# Patient Record
Sex: Female | Born: 1935 | Race: White | Hispanic: No | Marital: Married | Smoking: Former smoker
Health system: Southern US, Community
[De-identification: ages and names within clinical notes are randomized; demographics above are authoritative.]

## PROBLEM LIST (undated history)

## (undated) DIAGNOSIS — D649 Anemia, unspecified: Secondary | ICD-10-CM

## (undated) DIAGNOSIS — J449 Chronic obstructive pulmonary disease, unspecified: Secondary | ICD-10-CM

## (undated) DIAGNOSIS — F17201 Nicotine dependence, unspecified, in remission: Secondary | ICD-10-CM

## (undated) DIAGNOSIS — E039 Hypothyroidism, unspecified: Secondary | ICD-10-CM

## (undated) DIAGNOSIS — J9690 Respiratory failure, unspecified, unspecified whether with hypoxia or hypercapnia: Secondary | ICD-10-CM

## (undated) DIAGNOSIS — J9610 Chronic respiratory failure, unspecified whether with hypoxia or hypercapnia: Secondary | ICD-10-CM

## (undated) DIAGNOSIS — E785 Hyperlipidemia, unspecified: Secondary | ICD-10-CM

## (undated) DIAGNOSIS — I739 Peripheral vascular disease, unspecified: Secondary | ICD-10-CM

## (undated) DIAGNOSIS — R7309 Other abnormal glucose: Secondary | ICD-10-CM

## (undated) DIAGNOSIS — I10 Essential (primary) hypertension: Secondary | ICD-10-CM

## (undated) DIAGNOSIS — J9811 Atelectasis: Secondary | ICD-10-CM

## (undated) DIAGNOSIS — M1990 Unspecified osteoarthritis, unspecified site: Secondary | ICD-10-CM

## (undated) DIAGNOSIS — K219 Gastro-esophageal reflux disease without esophagitis: Secondary | ICD-10-CM

## (undated) DIAGNOSIS — M810 Age-related osteoporosis without current pathological fracture: Secondary | ICD-10-CM

## (undated) HISTORY — PX: CHOLECYSTECTOMY: SHX55

## (undated) HISTORY — DX: Other abnormal glucose: R73.09

## (undated) HISTORY — PX: COLON SURGERY: SHX602

## (undated) HISTORY — PX: ANKLE FRACTURE SURGERY: SHX122

## (undated) HISTORY — DX: Gastro-esophageal reflux disease without esophagitis: K21.9

---

## 1999-02-06 ENCOUNTER — Emergency Department (HOSPITAL_COMMUNITY): Admission: EM | Admit: 1999-02-06 | Discharge: 1999-02-06 | Payer: Self-pay | Admitting: Emergency Medicine

## 1999-11-29 ENCOUNTER — Other Ambulatory Visit: Admission: RE | Admit: 1999-11-29 | Discharge: 1999-11-29 | Payer: Self-pay | Admitting: Internal Medicine

## 2000-12-09 ENCOUNTER — Encounter: Admission: RE | Admit: 2000-12-09 | Discharge: 2000-12-09 | Payer: Self-pay | Admitting: Internal Medicine

## 2000-12-09 ENCOUNTER — Encounter: Payer: Self-pay | Admitting: Internal Medicine

## 2001-04-14 ENCOUNTER — Ambulatory Visit (HOSPITAL_COMMUNITY): Admission: RE | Admit: 2001-04-14 | Discharge: 2001-04-14 | Payer: Self-pay | Admitting: Gastroenterology

## 2001-07-24 ENCOUNTER — Other Ambulatory Visit: Admission: RE | Admit: 2001-07-24 | Discharge: 2001-07-24 | Payer: Self-pay | Admitting: Internal Medicine

## 2002-03-03 ENCOUNTER — Encounter: Payer: Self-pay | Admitting: Emergency Medicine

## 2002-03-04 ENCOUNTER — Encounter: Payer: Self-pay | Admitting: Internal Medicine

## 2002-03-04 ENCOUNTER — Inpatient Hospital Stay (HOSPITAL_COMMUNITY): Admission: EM | Admit: 2002-03-04 | Discharge: 2002-03-08 | Payer: Self-pay | Admitting: Emergency Medicine

## 2002-03-05 ENCOUNTER — Encounter: Payer: Self-pay | Admitting: Internal Medicine

## 2002-03-13 ENCOUNTER — Encounter (INDEPENDENT_AMBULATORY_CARE_PROVIDER_SITE_OTHER): Payer: Self-pay | Admitting: *Deleted

## 2002-03-13 ENCOUNTER — Inpatient Hospital Stay (HOSPITAL_COMMUNITY): Admission: RE | Admit: 2002-03-13 | Discharge: 2002-03-17 | Payer: Self-pay | Admitting: Surgery

## 2002-03-13 ENCOUNTER — Encounter: Payer: Self-pay | Admitting: Surgery

## 2002-03-16 ENCOUNTER — Encounter: Payer: Self-pay | Admitting: Internal Medicine

## 2002-06-01 ENCOUNTER — Encounter: Admission: RE | Admit: 2002-06-01 | Discharge: 2002-06-01 | Payer: Self-pay | Admitting: Internal Medicine

## 2002-06-01 ENCOUNTER — Encounter: Payer: Self-pay | Admitting: Internal Medicine

## 2002-06-03 ENCOUNTER — Encounter: Payer: Self-pay | Admitting: Internal Medicine

## 2002-06-03 ENCOUNTER — Encounter: Admission: RE | Admit: 2002-06-03 | Discharge: 2002-06-03 | Payer: Self-pay | Admitting: Family Medicine

## 2003-09-10 ENCOUNTER — Encounter: Admission: RE | Admit: 2003-09-10 | Discharge: 2003-09-10 | Payer: Self-pay | Admitting: Internal Medicine

## 2004-12-18 ENCOUNTER — Encounter: Admission: RE | Admit: 2004-12-18 | Discharge: 2004-12-18 | Payer: Self-pay | Admitting: Internal Medicine

## 2004-12-27 ENCOUNTER — Ambulatory Visit (HOSPITAL_COMMUNITY): Admission: RE | Admit: 2004-12-27 | Discharge: 2004-12-27 | Payer: Self-pay | Admitting: Gastroenterology

## 2005-02-12 ENCOUNTER — Encounter: Admission: RE | Admit: 2005-02-12 | Discharge: 2005-02-12 | Payer: Self-pay | Admitting: Gastroenterology

## 2005-10-30 ENCOUNTER — Encounter: Admission: RE | Admit: 2005-10-30 | Discharge: 2005-10-30 | Payer: Self-pay | Admitting: Internal Medicine

## 2005-11-01 ENCOUNTER — Inpatient Hospital Stay (HOSPITAL_COMMUNITY): Admission: AD | Admit: 2005-11-01 | Discharge: 2005-11-07 | Payer: Self-pay | Admitting: Internal Medicine

## 2006-01-29 ENCOUNTER — Encounter: Admission: RE | Admit: 2006-01-29 | Discharge: 2006-01-29 | Payer: Self-pay | Admitting: Internal Medicine

## 2006-04-10 ENCOUNTER — Encounter: Admission: RE | Admit: 2006-04-10 | Discharge: 2006-04-10 | Payer: Self-pay | Admitting: Gastroenterology

## 2006-04-24 ENCOUNTER — Inpatient Hospital Stay (HOSPITAL_COMMUNITY): Admission: AD | Admit: 2006-04-24 | Discharge: 2006-04-26 | Payer: Self-pay | Admitting: Gastroenterology

## 2007-03-13 ENCOUNTER — Emergency Department (HOSPITAL_COMMUNITY): Admission: EM | Admit: 2007-03-13 | Discharge: 2007-03-13 | Payer: Self-pay | Admitting: *Deleted

## 2007-04-25 ENCOUNTER — Ambulatory Visit (HOSPITAL_COMMUNITY): Admission: RE | Admit: 2007-04-25 | Discharge: 2007-04-25 | Payer: Self-pay | Admitting: Neurology

## 2007-05-01 ENCOUNTER — Ambulatory Visit: Payer: Self-pay | Admitting: *Deleted

## 2007-05-09 ENCOUNTER — Inpatient Hospital Stay (HOSPITAL_COMMUNITY): Admission: RE | Admit: 2007-05-09 | Discharge: 2007-05-11 | Payer: Self-pay | Admitting: *Deleted

## 2007-05-09 ENCOUNTER — Encounter (INDEPENDENT_AMBULATORY_CARE_PROVIDER_SITE_OTHER): Payer: Self-pay | Admitting: *Deleted

## 2007-05-09 ENCOUNTER — Ambulatory Visit: Payer: Self-pay | Admitting: *Deleted

## 2007-05-10 ENCOUNTER — Ambulatory Visit (HOSPITAL_COMMUNITY): Admission: RE | Admit: 2007-05-10 | Discharge: 2007-05-10 | Payer: Self-pay | Admitting: Neurology

## 2007-05-22 ENCOUNTER — Ambulatory Visit: Payer: Self-pay | Admitting: *Deleted

## 2007-06-13 ENCOUNTER — Ambulatory Visit: Payer: Self-pay | Admitting: Internal Medicine

## 2007-06-13 DIAGNOSIS — K219 Gastro-esophageal reflux disease without esophagitis: Secondary | ICD-10-CM

## 2007-06-14 DIAGNOSIS — J4489 Other specified chronic obstructive pulmonary disease: Secondary | ICD-10-CM | POA: Insufficient documentation

## 2007-06-14 DIAGNOSIS — J449 Chronic obstructive pulmonary disease, unspecified: Secondary | ICD-10-CM

## 2007-06-26 DIAGNOSIS — M810 Age-related osteoporosis without current pathological fracture: Secondary | ICD-10-CM | POA: Insufficient documentation

## 2007-06-26 DIAGNOSIS — E785 Hyperlipidemia, unspecified: Secondary | ICD-10-CM | POA: Insufficient documentation

## 2007-06-26 DIAGNOSIS — R7309 Other abnormal glucose: Secondary | ICD-10-CM

## 2007-06-27 ENCOUNTER — Ambulatory Visit: Payer: Self-pay | Admitting: Internal Medicine

## 2007-08-07 ENCOUNTER — Encounter: Payer: Self-pay | Admitting: Internal Medicine

## 2007-08-21 ENCOUNTER — Ambulatory Visit: Payer: Self-pay | Admitting: Internal Medicine

## 2007-08-21 ENCOUNTER — Ambulatory Visit: Payer: Self-pay | Admitting: *Deleted

## 2007-09-05 ENCOUNTER — Ambulatory Visit: Payer: Self-pay | Admitting: Internal Medicine

## 2007-09-16 ENCOUNTER — Ambulatory Visit: Payer: Self-pay | Admitting: Pulmonary Disease

## 2007-09-22 ENCOUNTER — Telehealth (INDEPENDENT_AMBULATORY_CARE_PROVIDER_SITE_OTHER): Payer: Self-pay | Admitting: *Deleted

## 2007-09-30 ENCOUNTER — Ambulatory Visit: Payer: Self-pay | Admitting: Internal Medicine

## 2007-10-09 ENCOUNTER — Encounter: Admission: RE | Admit: 2007-10-09 | Discharge: 2007-10-09 | Payer: Self-pay | Admitting: Internal Medicine

## 2007-10-21 ENCOUNTER — Ambulatory Visit: Payer: Self-pay | Admitting: Internal Medicine

## 2007-10-21 ENCOUNTER — Telehealth (INDEPENDENT_AMBULATORY_CARE_PROVIDER_SITE_OTHER): Payer: Self-pay | Admitting: *Deleted

## 2007-10-21 DIAGNOSIS — R0609 Other forms of dyspnea: Secondary | ICD-10-CM

## 2007-10-21 DIAGNOSIS — R0989 Other specified symptoms and signs involving the circulatory and respiratory systems: Secondary | ICD-10-CM

## 2007-10-28 LAB — CONVERTED CEMR LAB
BUN: 22 mg/dL (ref 6–23)
Basophils Relative: 0.2 % (ref 0.0–1.0)
CO2: 30 meq/L (ref 19–32)
Chloride: 97 meq/L (ref 96–112)
Creatinine, Ser: 0.9 mg/dL (ref 0.4–1.2)
Eosinophils Absolute: 0 10*3/uL (ref 0.0–0.7)
Eosinophils Relative: 0.1 % (ref 0.0–5.0)
Glucose, Bld: 333 mg/dL — ABNORMAL HIGH (ref 70–99)
Lymphocytes Relative: 8.3 % — ABNORMAL LOW (ref 12.0–46.0)
MCV: 92.1 fL (ref 78.0–100.0)
Monocytes Relative: 1.9 % — ABNORMAL LOW (ref 3.0–12.0)
Neutrophils Relative %: 89.5 % — ABNORMAL HIGH (ref 43.0–77.0)
RBC: 3.66 M/uL — ABNORMAL LOW (ref 3.87–5.11)
WBC: 6.3 10*3/uL (ref 4.5–10.5)

## 2007-10-31 ENCOUNTER — Ambulatory Visit: Payer: Self-pay | Admitting: Internal Medicine

## 2007-11-04 LAB — CONVERTED CEMR LAB
T3 Uptake Ratio: 42.6 % — ABNORMAL HIGH (ref 22.5–37.0)
T3, Free: 2.7 pg/mL (ref 2.3–4.2)
T4, Total: 9.2 ug/dL (ref 5.0–12.5)

## 2007-11-05 ENCOUNTER — Encounter: Payer: Self-pay | Admitting: Internal Medicine

## 2007-11-07 ENCOUNTER — Ambulatory Visit: Payer: Self-pay | Admitting: Endocrinology

## 2007-11-07 ENCOUNTER — Ambulatory Visit: Payer: Self-pay | Admitting: Internal Medicine

## 2007-11-07 DIAGNOSIS — E039 Hypothyroidism, unspecified: Secondary | ICD-10-CM | POA: Insufficient documentation

## 2007-11-24 ENCOUNTER — Encounter: Payer: Self-pay | Admitting: Internal Medicine

## 2007-11-28 ENCOUNTER — Encounter: Payer: Self-pay | Admitting: Internal Medicine

## 2007-12-22 ENCOUNTER — Ambulatory Visit: Payer: Self-pay | Admitting: Internal Medicine

## 2008-01-28 ENCOUNTER — Encounter: Payer: Self-pay | Admitting: Internal Medicine

## 2008-02-20 ENCOUNTER — Ambulatory Visit: Payer: Self-pay | Admitting: Internal Medicine

## 2008-02-20 DIAGNOSIS — R109 Unspecified abdominal pain: Secondary | ICD-10-CM | POA: Insufficient documentation

## 2008-02-26 ENCOUNTER — Ambulatory Visit: Payer: Self-pay | Admitting: *Deleted

## 2008-03-11 ENCOUNTER — Ambulatory Visit: Payer: Self-pay | Admitting: *Deleted

## 2008-05-06 ENCOUNTER — Ambulatory Visit: Payer: Self-pay | Admitting: *Deleted

## 2008-05-15 ENCOUNTER — Encounter: Admission: RE | Admit: 2008-05-15 | Discharge: 2008-05-15 | Payer: Self-pay | Admitting: *Deleted

## 2008-05-20 ENCOUNTER — Ambulatory Visit: Payer: Self-pay | Admitting: *Deleted

## 2008-06-07 ENCOUNTER — Encounter: Payer: Self-pay | Admitting: Internal Medicine

## 2008-06-08 ENCOUNTER — Encounter: Payer: Self-pay | Admitting: Internal Medicine

## 2008-06-21 ENCOUNTER — Telehealth (INDEPENDENT_AMBULATORY_CARE_PROVIDER_SITE_OTHER): Payer: Self-pay | Admitting: *Deleted

## 2008-06-25 ENCOUNTER — Ambulatory Visit: Payer: Self-pay | Admitting: Internal Medicine

## 2008-06-27 ENCOUNTER — Emergency Department (HOSPITAL_COMMUNITY): Admission: EM | Admit: 2008-06-27 | Discharge: 2008-06-27 | Payer: Self-pay | Admitting: Emergency Medicine

## 2008-07-09 ENCOUNTER — Telehealth: Payer: Self-pay | Admitting: Endocrinology

## 2008-07-12 ENCOUNTER — Ambulatory Visit: Payer: Self-pay | Admitting: Internal Medicine

## 2008-07-12 DIAGNOSIS — R42 Dizziness and giddiness: Secondary | ICD-10-CM

## 2008-07-12 LAB — CONVERTED CEMR LAB
BUN: 28 mg/dL — ABNORMAL HIGH (ref 6–23)
Basophils Absolute: 0 10*3/uL (ref 0.0–0.1)
Basophils Relative: 0.4 % (ref 0.0–3.0)
CO2: 32 meq/L (ref 19–32)
Chloride: 100 meq/L (ref 96–112)
Creatinine, Ser: 0.9 mg/dL (ref 0.4–1.2)
Eosinophils Relative: 0.9 % (ref 0.0–5.0)
Lymphocytes Relative: 9 % — ABNORMAL LOW (ref 12.0–46.0)
Neutrophils Relative %: 86.4 % — ABNORMAL HIGH (ref 43.0–77.0)
Platelets: 339 10*3/uL (ref 150–400)
Potassium: 4.3 meq/L (ref 3.5–5.1)
Pro B Natriuretic peptide (BNP): 187 pg/mL — ABNORMAL HIGH (ref 0.0–100.0)
RBC: 3.26 M/uL — ABNORMAL LOW (ref 3.87–5.11)
WBC: 9.7 10*3/uL (ref 4.5–10.5)

## 2008-07-28 ENCOUNTER — Inpatient Hospital Stay (HOSPITAL_COMMUNITY): Admission: EM | Admit: 2008-07-28 | Discharge: 2008-07-29 | Payer: Self-pay | Admitting: Emergency Medicine

## 2008-08-25 ENCOUNTER — Ambulatory Visit: Payer: Self-pay | Admitting: Internal Medicine

## 2008-09-27 ENCOUNTER — Ambulatory Visit: Payer: Self-pay | Admitting: Internal Medicine

## 2008-10-05 ENCOUNTER — Ambulatory Visit: Payer: Self-pay | Admitting: Pulmonary Disease

## 2008-10-12 ENCOUNTER — Inpatient Hospital Stay (HOSPITAL_COMMUNITY): Admission: EM | Admit: 2008-10-12 | Discharge: 2008-10-15 | Payer: Self-pay | Admitting: Emergency Medicine

## 2008-11-24 ENCOUNTER — Ambulatory Visit: Payer: Self-pay | Admitting: Vascular Surgery

## 2008-11-24 ENCOUNTER — Encounter (INDEPENDENT_AMBULATORY_CARE_PROVIDER_SITE_OTHER): Payer: Self-pay | Admitting: Internal Medicine

## 2008-11-24 ENCOUNTER — Inpatient Hospital Stay (HOSPITAL_COMMUNITY): Admission: EM | Admit: 2008-11-24 | Discharge: 2008-11-30 | Payer: Self-pay | Admitting: Emergency Medicine

## 2008-11-24 ENCOUNTER — Ambulatory Visit: Payer: Self-pay | Admitting: Emergency Medicine

## 2008-11-29 ENCOUNTER — Ambulatory Visit: Payer: Self-pay | Admitting: Physical Medicine & Rehabilitation

## 2008-11-30 ENCOUNTER — Inpatient Hospital Stay (HOSPITAL_COMMUNITY)
Admission: RE | Admit: 2008-11-30 | Discharge: 2008-12-09 | Payer: Self-pay | Admitting: Physical Medicine & Rehabilitation

## 2008-12-01 ENCOUNTER — Encounter: Payer: Self-pay | Admitting: Internal Medicine

## 2008-12-14 ENCOUNTER — Encounter: Payer: Self-pay | Admitting: Internal Medicine

## 2008-12-17 ENCOUNTER — Encounter: Payer: Self-pay | Admitting: Internal Medicine

## 2008-12-21 ENCOUNTER — Ambulatory Visit: Payer: Self-pay | Admitting: Internal Medicine

## 2009-02-04 ENCOUNTER — Ambulatory Visit: Payer: Self-pay | Admitting: Internal Medicine

## 2009-03-04 ENCOUNTER — Telehealth (INDEPENDENT_AMBULATORY_CARE_PROVIDER_SITE_OTHER): Payer: Self-pay | Admitting: *Deleted

## 2009-03-18 ENCOUNTER — Ambulatory Visit: Payer: Self-pay | Admitting: Internal Medicine

## 2009-03-25 ENCOUNTER — Encounter: Admission: RE | Admit: 2009-03-25 | Discharge: 2009-03-25 | Payer: Self-pay | Admitting: Internal Medicine

## 2009-05-03 ENCOUNTER — Ambulatory Visit: Payer: Self-pay | Admitting: Internal Medicine

## 2009-07-06 ENCOUNTER — Encounter: Payer: Self-pay | Admitting: Internal Medicine

## 2009-07-29 ENCOUNTER — Telehealth (INDEPENDENT_AMBULATORY_CARE_PROVIDER_SITE_OTHER): Payer: Self-pay | Admitting: *Deleted

## 2009-09-23 ENCOUNTER — Telehealth: Payer: Self-pay | Admitting: Internal Medicine

## 2010-03-16 ENCOUNTER — Ambulatory Visit: Payer: Self-pay | Admitting: Pulmonary Disease

## 2010-03-16 ENCOUNTER — Encounter: Payer: Self-pay | Admitting: Pulmonary Disease

## 2010-03-16 DIAGNOSIS — R079 Chest pain, unspecified: Secondary | ICD-10-CM | POA: Insufficient documentation

## 2010-03-17 ENCOUNTER — Telehealth: Payer: Self-pay | Admitting: Pulmonary Disease

## 2010-03-21 LAB — CONVERTED CEMR LAB
BUN: 27 mg/dL — ABNORMAL HIGH (ref 6–23)
Basophils Relative: 0.5 % (ref 0.0–3.0)
CO2: 33 meq/L — ABNORMAL HIGH (ref 19–32)
Chloride: 97 meq/L (ref 96–112)
Creatinine, Ser: 1 mg/dL (ref 0.4–1.2)
Eosinophils Absolute: 0 10*3/uL (ref 0.0–0.7)
Eosinophils Relative: 0.3 % (ref 0.0–5.0)
Glucose, Bld: 148 mg/dL — ABNORMAL HIGH (ref 70–99)
Hemoglobin: 12.3 g/dL (ref 12.0–15.0)
Lymphocytes Relative: 7.9 % — ABNORMAL LOW (ref 12.0–46.0)
MCHC: 34.1 g/dL (ref 30.0–36.0)
Neutro Abs: 9.4 10*3/uL — ABNORMAL HIGH (ref 1.4–7.7)
Neutrophils Relative %: 85.8 % — ABNORMAL HIGH (ref 43.0–77.0)
Potassium: 4.9 meq/L (ref 3.5–5.1)
Pro B Natriuretic peptide (BNP): 63.4 pg/mL (ref 0.0–100.0)
RBC: 3.71 M/uL — ABNORMAL LOW (ref 3.87–5.11)
Total CK: 86 units/L (ref 7–177)
WBC: 11 10*3/uL — ABNORMAL HIGH (ref 4.5–10.5)

## 2010-04-03 ENCOUNTER — Ambulatory Visit: Payer: Self-pay | Admitting: Internal Medicine

## 2010-04-03 DIAGNOSIS — M549 Dorsalgia, unspecified: Secondary | ICD-10-CM | POA: Insufficient documentation

## 2010-04-10 ENCOUNTER — Telehealth: Payer: Self-pay | Admitting: Adult Health

## 2010-04-11 ENCOUNTER — Telehealth: Payer: Self-pay | Admitting: Adult Health

## 2010-04-12 ENCOUNTER — Encounter: Admission: RE | Admit: 2010-04-12 | Discharge: 2010-04-12 | Payer: Self-pay | Admitting: Internal Medicine

## 2010-04-12 ENCOUNTER — Telehealth: Payer: Self-pay | Admitting: Adult Health

## 2010-04-13 ENCOUNTER — Telehealth (INDEPENDENT_AMBULATORY_CARE_PROVIDER_SITE_OTHER): Payer: Self-pay | Admitting: *Deleted

## 2010-04-14 ENCOUNTER — Telehealth (INDEPENDENT_AMBULATORY_CARE_PROVIDER_SITE_OTHER): Payer: Self-pay | Admitting: *Deleted

## 2010-04-18 ENCOUNTER — Ambulatory Visit (HOSPITAL_COMMUNITY): Admission: RE | Admit: 2010-04-18 | Discharge: 2010-04-18 | Payer: Self-pay | Admitting: Internal Medicine

## 2010-04-20 ENCOUNTER — Telehealth: Payer: Self-pay | Admitting: Internal Medicine

## 2010-04-20 ENCOUNTER — Ambulatory Visit: Payer: Self-pay | Admitting: Internal Medicine

## 2010-04-21 LAB — CONVERTED CEMR LAB
AST: 27 units/L (ref 0–37)
Albumin: 3.6 g/dL (ref 3.5–5.2)
Alkaline Phosphatase: 94 units/L (ref 39–117)
Basophils Absolute: 0 10*3/uL (ref 0.0–0.1)
Basophils Relative: 0 % (ref 0.0–3.0)
CO2: 30 meq/L (ref 19–32)
Calcium: 9.3 mg/dL (ref 8.4–10.5)
Chloride: 94 meq/L — ABNORMAL LOW (ref 96–112)
Eosinophils Absolute: 0 10*3/uL (ref 0.0–0.7)
Glucose, Bld: 64 mg/dL — ABNORMAL LOW (ref 70–99)
HCT: 35 % — ABNORMAL LOW (ref 36.0–46.0)
Hemoglobin: 12.1 g/dL (ref 12.0–15.0)
Lymphocytes Relative: 7.5 % — ABNORMAL LOW (ref 12.0–46.0)
Lymphs Abs: 0.6 10*3/uL — ABNORMAL LOW (ref 0.7–4.0)
MCHC: 34.5 g/dL (ref 30.0–36.0)
MCV: 95.2 fL (ref 78.0–100.0)
Neutro Abs: 6.6 10*3/uL (ref 1.4–7.7)
Potassium: 4.8 meq/L (ref 3.5–5.1)
RBC: 3.67 M/uL — ABNORMAL LOW (ref 3.87–5.11)
RDW: 12.9 % (ref 11.5–14.6)
Sodium: 134 meq/L — ABNORMAL LOW (ref 135–145)
TSH: 2.36 microintl units/mL (ref 0.35–5.50)
Total Protein: 6.6 g/dL (ref 6.0–8.3)

## 2010-05-01 ENCOUNTER — Ambulatory Visit: Payer: Self-pay | Admitting: Adult Health

## 2010-05-04 ENCOUNTER — Telehealth: Payer: Self-pay | Admitting: Adult Health

## 2010-05-09 ENCOUNTER — Telehealth: Payer: Self-pay | Admitting: Internal Medicine

## 2010-05-09 ENCOUNTER — Emergency Department (HOSPITAL_COMMUNITY)
Admission: EM | Admit: 2010-05-09 | Discharge: 2010-05-10 | Payer: Self-pay | Source: Home / Self Care | Admitting: Emergency Medicine

## 2010-05-13 ENCOUNTER — Inpatient Hospital Stay (HOSPITAL_COMMUNITY)
Admission: EM | Admit: 2010-05-13 | Discharge: 2010-05-20 | Payer: Self-pay | Source: Home / Self Care | Attending: Internal Medicine | Admitting: Internal Medicine

## 2010-05-16 ENCOUNTER — Telehealth: Payer: Self-pay | Admitting: Internal Medicine

## 2010-05-17 LAB — GLUCOSE, CAPILLARY
Glucose-Capillary: 267 mg/dL — ABNORMAL HIGH (ref 70–99)
Glucose-Capillary: 156 mg/dL — ABNORMAL HIGH (ref 70–99)
Glucose-Capillary: 166 mg/dL — ABNORMAL HIGH (ref 70–99)
Glucose-Capillary: 205 mg/dL — ABNORMAL HIGH (ref 70–99)

## 2010-05-17 LAB — BASIC METABOLIC PANEL
BUN: 31 mg/dL — ABNORMAL HIGH (ref 6–23)
CO2: 28 mEq/L (ref 19–32)
Calcium: 9.2 mg/dL (ref 8.4–10.5)
Chloride: 98 mEq/L (ref 96–112)
Creatinine, Ser: 0.78 mg/dL (ref 0.4–1.2)
GFR calc Af Amer: >60 mL/min (ref >60)
GFR calc non Af Amer: >60 mL/min (ref >60)
Glucose, Bld: 221 mg/dL — ABNORMAL HIGH (ref 70–99)
Potassium: 4.9 mEq/L (ref 3.5–5.1)
Sodium: 135 mEq/L (ref 135–145)

## 2010-05-17 LAB — CBC
HCT: 35.4 % — ABNORMAL LOW (ref 36.0–46.0)
Hemoglobin: 11.9 g/dL — ABNORMAL LOW (ref 12.0–15.0)
MCH: 31.2 pg (ref 26.0–34.0)
MCHC: 33.6 g/dL (ref 30.0–36.0)
MCV: 92.9 fL (ref 78.0–100.0)
Platelets: 333 10*3/uL (ref 150–400)
RBC: 3.81 MIL/uL — ABNORMAL LOW (ref 3.87–5.11)
RDW: 12.4 % (ref 11.5–15.5)
WBC: 10 10*3/uL (ref 4.0–10.5)

## 2010-05-18 LAB — GLUCOSE, CAPILLARY
Glucose-Capillary: 121 mg/dL — ABNORMAL HIGH (ref 70–99)
Glucose-Capillary: 140 mg/dL — ABNORMAL HIGH (ref 70–99)
Glucose-Capillary: 108 mg/dL — ABNORMAL HIGH (ref 70–99)
Glucose-Capillary: 59 mg/dL — ABNORMAL LOW (ref 70–99)
Glucose-Capillary: 197 mg/dL — ABNORMAL HIGH (ref 70–99)

## 2010-05-19 ENCOUNTER — Telehealth: Payer: Self-pay | Admitting: Internal Medicine

## 2010-05-19 LAB — GLUCOSE, CAPILLARY
Glucose-Capillary: 192 mg/dL — ABNORMAL HIGH (ref 70–99)
Glucose-Capillary: 45 mg/dL — ABNORMAL LOW (ref 70–99)
Glucose-Capillary: 97 mg/dL (ref 70–99)

## 2010-05-29 LAB — GLUCOSE, CAPILLARY
Glucose-Capillary: 146 mg/dL — ABNORMAL HIGH (ref 70–99)
Glucose-Capillary: 138 mg/dL — ABNORMAL HIGH (ref 70–99)
Glucose-Capillary: 151 mg/dL — ABNORMAL HIGH (ref 70–99)
Glucose-Capillary: 82 mg/dL (ref 70–99)

## 2010-05-29 LAB — CBC
HCT: 38 % (ref 36.0–46.0)
Hemoglobin: 12.5 g/dL (ref 12.0–15.0)
MCH: 31.6 pg (ref 26.0–34.0)
MCHC: 32.9 g/dL (ref 30.0–36.0)
MCV: 96 fL (ref 78.0–100.0)
Platelets: 345 10*3/uL (ref 150–400)
RBC: 3.96 MIL/uL (ref 3.87–5.11)
RDW: 12.8 % (ref 11.5–15.5)
WBC: 17.1 10*3/uL — ABNORMAL HIGH (ref 4.0–10.5)

## 2010-06-04 ENCOUNTER — Encounter: Payer: Self-pay | Admitting: Neurology

## 2010-06-04 ENCOUNTER — Encounter: Payer: Self-pay | Admitting: Gastroenterology

## 2010-06-04 ENCOUNTER — Encounter: Payer: Self-pay | Admitting: Internal Medicine

## 2010-06-15 NOTE — Progress Notes (Signed)
Summary: test results  Phone Note Call from Patient Call back at Home Phone [phone]   Caller: Patient Call For: alva Reason for Call: Lab or Test Results Summary of Call: Wants results of labs and cxr. Initial call taken by: Darletta Moll,  March 17, 2010 1:23 PM  Follow-up for Phone Call        cxr signed in EMR but no interpretion for nurse.  please advise and will call pt, thanks! Boone Master CNA/MA  March 17, 2010 2:52 PM   Additional Follow-up for Phone Call Additional follow up Details #1::        Let her know  - no evidence of heart attack or pneumonia, may have to consider vertebral fracture with nerve impingement or intestinal problems as cause of pain - wait & see how she does with pain meds - fax repots to dr gates [address] - I have spoken to him Additional Follow-up by: Comer Locket. Vassie Loll MD,  March 17, 2010 5:01 PM    Additional Follow-up for Phone Call Additional follow up Details #2::    Pt advised. [name] CMA  March 17, 2010 5:08 PM

## 2010-06-15 NOTE — Assessment & Plan Note (Signed)
Summary: SOB/ MW'S PT ///KP   Visit Type:  Sick visit Primary Provider/Referring Provider:  Johnella Moloney  CC:  Pt of Dr. Sherene Sires... c/o increased SOB starting yesterday and sharp intermittent upper abdominal pain and bloating.  Pt was informed by [facility] that she has blocked vessels in arteries causing pain.  History of Present Illness: 73  yowf quit smoking in the year 2002 with onset doe and a mild chronic cough.  She subsequently gained 15 pounds and beginning about the middle of 2006 began to have exacerbations with coughing wheezing and shortness of breath which required short course of prednisone to get her back to baseline but requiring  Prednisone daily since  3/09.   December 22, 2007 ov no decrease in ex tol albeit on oxygen at 24 hours a day and maintaining prednisone at 10 mg daily with gradual improvement in dyspnea.  March 16, 2010 3:39 PM  c/o sharp pain substernally radiating back along lower rib cage, c/o dyspnea , no wheeze, no signs of cold - NOt taking spiriva Steroid dependent, takes pulmicort & duonebs  Told at [facility] that she has clogged arteries causing abdominal pain EKG - nSR with RBBB& left bifascicular block - q waves in V1-2 related to BBB ? vs old MI    Preventive Screening-Counseling & Management  Alcohol-Tobacco     Smoking Status: quit     Year Quit: 2001     Pack years: 1 1/2 PPD X 55 years.  Current Medications (verified): 1)  Sertraline Hcl 50 Mg  Tabs (Sertraline Hcl) .... 1/2 Tab By Mouth Once Daily 2)  Benicar 20 Mg Tabs (Olmesartan Medoxomil) .... 1/2 Once Daily 3)  Centrum Silver   Tabs (Multiple Vitamins-Minerals) .... Once Daily 4)  Caltrate 600+d Plus 600-400 Mg-Unit  Tabs (Calcium Carbonate-Vit D-Min) .... Two Times A Day 5)  Vitamin D3 1000 Unit  Tabs (Cholecalciferol) .... Two Times A Day 6)  Vitamin C 1000 Mg Tabs (Ascorbic Acid) .... Take 1 Tablet By Mouth Two Times A Day 7)  Potassium Gluconate 595 Mg  Tbcr (Potassium Gluconate)  .... Once Daily 8)  Omeprazole 20 Mg Cpdr (Omeprazole) .Marland Kitchen.. 1 Capsule By Mouth 30 Minutes Before First and Last Meals of The Day 9)  Magnesium 500 Mg Tabs (Magnesium) .Marland Kitchen.. 1 By Mouth Daily 10)  Vitamin B-12 1000 Mcg Tabs (Cyanocobalamin) .... Take 1/2 Tablet By Mouth Once A Day 11)  Humalog Mix 75/25 75-25 % Susp (Insulin Lispro Prot & Lispro) .... 40 Units Every Am and 10 Units At Bedtime*******sliding Scale******* 12)  Novolog 100 Unit/ml  Soln (Insulin Aspart) .... Sliding Scale With Lunch 13)  Furosemide 40 Mg  Tabs (Furosemide) .Marland Kitchen.. 1 1/2 Tab Daily 14)  Levothyroxine Sodium 88 Mcg  Tabs (Levothyroxine Sodium) .... Take 1 Tablet By Mouth Once A Day 15)  Aspirin Adult Low Strength 81 Mg  Tbec (Aspirin) .... 2 Tabs By Mouth Once Daily 16)  Prednisone 10 Mg Tabs (Prednisone) .... Take As Directed or See Bottom of Med Calendar For Specific Tapering Instructions 17)  Duoneb 0.5-2.5 (3) Mg/14ml  Soln (Ipratropium-Albuterol) .Marland Kitchen.. 1 Neb Four Times A Day 18)  Pulmicort 0.5 Mg/75ml  Susp (Budesonide) .... Use 1 Vial Two Times A Day in Nebulizer. 19)  Oxygen 2 Liters .... At Bedtime 20)  Citrucel  Powd (Methylcellulose (Laxative)) .... 1/2 Scoop At Bedtime 21)  Nasal Spray 0.65 % Soln (Saline) .... 2 Puffs Two Times A Day As Needed 22)  Proventil Hfa 108 (90  Base) Mcg/act  Aers (Albuterol Sulfate) .... 2 Puffs Every 4 Hours As Needed 23)  Diazepam 5 Mg  Tabs (Diazepam) .... Two Times A Day As Needed 24)  Oxygen .... Wear 2liters As Needed 25)  Prednisone 10 Mg  Tabs (Prednisone) .... Increase To 2 Tabs Until Back To Your Baseline.  Then 1 Tab Daily and Hold. 26)  Maalox Regular Strength 200-200-20 Mg/70ml Susp (Alum & Mag Hydroxide-Simeth) .... As Directed 27)  Phillips Milk of Magnesia 7.75 % Susp (Magnesium Hydroxide) .... 3 Tablespoonfuls At Bedtime  Allergies (verified): 1)  ! * Spiriva 2)  ! Sulfa 3)  ! Penicillin 4)  ! * Statins  Past History:  Past Medical History: Last updated:  05/03/2009 OSTEOPOROSIS (ICD-733.00) HYPERGLYCEMIA (ICD-790.29) HYPERLIPIDEMIA (ICD-272.4) GERD (ICD-530.81) COPD UNSPECIFIED (ICD-496)    - PFTs 09/05/07 FEV1 49% with improvement by 8% ratio 39% and diffusing capacity 58%   - prednisone -dependent since 3/09   - HFA poor February 04, 2009  HYPOTHYROIDISM (ICD-244.9) COMPLEX MEDICAL REGIMEN    - Med calendar redone June 25, 2008  >> Sep 27, 2008     - Not using calendar February 04, 2009 > not using May 03, 2009   Social History: Last updated: 11/07/2007 quit smoking 2002 married  Review of Systems       The patient complains of dyspnea on exertion.  The patient denies anorexia, fever, weight loss, weight gain, vision loss, decreased hearing, hoarseness, chest pain, syncope, peripheral edema, prolonged cough, headaches, hemoptysis, abdominal pain, melena, hematochezia, severe indigestion/heartburn, hematuria, muscle weakness, suspicious skin lesions, difficulty walking, depression, unusual weight change, abnormal bleeding, enlarged lymph nodes, and angioedema.    Vital Signs:  Patient profile:   77 year old female Height:      62 inches Weight:      139.4 pounds BMI:     25.59 O2 Sat:      98 % on 2 L/min Temp:     98.1 degrees F oral Pulse rate:   79 / minute BP sitting:   126 / 60  (left arm) Cuff size:   regular  Vitals Entered By: Zackery Barefoot CMA (March 16, 2010 3:22 PM)  O2 Flow:  2 L/min CC: Pt of Dr. Sherene Sires... c/o increased SOB starting yesterday, sharp intermittent upper abdominal pain and bloating.  Pt was informed by [facility] that she has blocked vessels in arteries causing pain Comments Medications reviewed with patient Verified contact number and pharmacy with patient Zackery Barefoot CMA  March 16, 2010 3:22 PM    Physical Exam  Additional Exam:   chronically ill  ambulatory white female in no acute distress    wt  139  10/9/9  >  145 July 12, 2008 > 138 December 21, 2008 > 136 February 04, 2009 >> 139 March 16, 2010    HEENT mild turbinate edema.  Oropharynx no thrush or excess pnd or cobblestoning.  No JVD or cervical adenopathy. Mild accessory muscle hypertrophy. Trachea midline, nl thryroid. Chest was hyperinflated by percussion with diminished breath sounds and marked increased exp time without wheeze. Hoover sign positive onset of inspiration. Regular rate and rhythm without murmur gallop or rub or increase P2 or edema.  Decrease s1s2 Abd: no hsm, nl excursion. Ext warm without cyanosis or clubbing   Impression & Recommendations:  Problem # 1:  CHEST PAIN-UNSPECIFIED (ICD-786.50) Favor musculoskeleta l cause of chest pain , no rash of shingles - Has vertebral fractures on old  imaging studies Unable to interpret ST/ T changes on EKG in presence of bundle brach nlock, will get cardiac enzymes, doubt acute coronary syndrome Not obvious GERD CXR today Would suggest round the clock tylenol - may need stronger pain meds like toradol - avoid narcotics or low dose if at all. Orders: T-2 View CXR ([id number]) TLB-CBC Platelet - w/Differential (85025-CBCD) TLB-BMP (Basic Metabolic Panel-BMET) (80048-METABOL) TLB-Cardiac Panel ([id number]) TLB-BNP (B-Natriuretic Peptide) (83880-BNPR) Est. Patient Level IV (19147)  Problem # 2:  COPD UNSPECIFIED (ICD-496) ctc urrent regimen of prednisone, pulmicort & duonebs  Medications Added to Medication List This Visit: 1)  Vitamin B-12 1000 Mcg Tabs (Cyanocobalamin) .... Take 1/2 tablet by mouth once a day 2)  Humalog Mix 75/25 75-25 % Susp (Insulin lispro prot & lispro) .... 40 units every am and 10 units at bedtime*******sliding scale******* 3)  Citrucel Powd (Methylcellulose (laxative)) .... 1/2 scoop at bedtime 4)  Phillips Milk of Magnesia 7.75 % Susp (Magnesium hydroxide) .... 3 tablespoonfuls at bedtime  Patient Instructions: 1)  Copy sent to: Johnella Moloney,  Wert 2)  Please schedule a follow-up appointment in 3 weeks with  TP 3)  A chest x-ray has been recommended.  Your imaging study may require preauthorization.  4)  Blood work 5)  EKG 6)  Take tylenol 500 mg q6h as needed - If pain no better, may need stronger pain medication- call Dr Kevan Ny    Appended Document: SOB/ MW'S PT ///KP d/w Dr Kevan Ny

## 2010-06-15 NOTE — Assessment & Plan Note (Signed)
Summary: Pulmonary/ ext ov fatigue > sob    Primary Provider/Referring Provider:  Johnella Moloney  CC:  Dyspnea- worse.  History of Present Illness: 40  yowf quit smoking  2002 with onset doe and a mild chronic cough.  She subsequently gained 15 pounds and beginning about the middle of 2006 began to have exacerbations with coughing wheezing and shortness of breath which required short course of prednisone to get her back to baseline but requiring  Prednisone daily since  3/09.   December 22, 2007 ov no decrease in ex tol albeit on oxygen at 24 hours a day and maintaining prednisone at 10 mg daily with gradual improvement in dyspnea.  March 16, 2010  c/o sharp pain substernally radiating back along lower rib cage, c/o dyspnea , no wheeze, no signs of cold - Not taking spiriva Steroid dependent, takes pulmicort & duonebs  Told at [facility] that she has clogged arteries causing abdominal pain EKG - nSR with RBBB& left bifascicular block - q waves in V1-2 related to BBB ? vs old MI  April 03, 2010 --Presents for follow up. Had fall 3 weeks ago. She lost her balance in closet 10/29 and fell forward. Had bilateral sharp rib pain w/ radiation into back. CXR w/ no acute changes. Seen at urgent care 2 weeks go, tx w/ advil. She is no better. Has sudden sharp pain in ribs bialterally w/ pain into back-catching pain w/ sudden movement. Denies chest pain, dyspnea, orthopnea, hemoptysis, fever, n/v/d, edema, headache. Her to breathe in at times.  dx T 9 fx > Deveshwar eval, no rx.   April 20, 2010 ov cc sob x 3 weeks, sob at rest, no unusual cough/ congestion, does ok sleeping . Increased pred back to 20 mg per day with no benefit. no purulent sputum. back better, no leg c/ow just gen fatigue.  Pt denies any significant sore throat, nasal congestion or excess secretions, fever, chills, sweats, unintended wt loss, pleuritic or exertional cp, orthopnea pnd or leg swelling.    Current Medications  (verified): 1)  Sertraline Hcl 50 Mg  Tabs (Sertraline Hcl) .... 1/2 Tab By Mouth Once Daily 2)  Benicar 20 Mg Tabs (Olmesartan Medoxomil) .... 1/2 Once Daily 3)  Centrum Silver   Tabs (Multiple Vitamins-Minerals) .... Once Daily 4)  Caltrate 600+d Plus 600-400 Mg-Unit  Tabs (Calcium Carbonate-Vit D-Min) .... Two Times A Day 5)  Vitamin D3 1000 Unit  Tabs (Cholecalciferol) .... Two Times A Day 6)  Vitamin C 1000 Mg Tabs (Ascorbic Acid) .... Take 1 Tablet By Mouth Two Times A Day 7)  Potassium Gluconate 595 Mg  Tbcr (Potassium Gluconate) .... Once Daily 8)  Omeprazole 20 Mg Cpdr (Omeprazole) .Marland Kitchen.. 1 Capsule By Mouth 30 Minutes Before First and Last Meals of The Day 9)  Magnesium 500 Mg Tabs (Magnesium) .Marland Kitchen.. 1 By Mouth Daily 10)  Vitamin B-12 1000 Mcg Tabs (Cyanocobalamin) .... Take 1/2 Tablet By Mouth Once A Day 11)  Humalog Mix 75/25 75-25 % Susp (Insulin Lispro Prot & Lispro) .... Sliding Scale With Each Meal 12)  Novolog 100 Unit/ml  Soln (Insulin Aspart) .... Sliding Scale With Lunch 13)  Furosemide 40 Mg  Tabs (Furosemide) .Marland Kitchen.. 1 1/2 Tab Daily 14)  Levothyroxine Sodium 88 Mcg  Tabs (Levothyroxine Sodium) .... Take 1 Tablet By Mouth Once A Day 15)  Aspirin Adult Low Strength 81 Mg  Tbec (Aspirin) .... 2 Tabs By Mouth Once Daily 16)  Prednisone 10 Mg Tabs (Prednisone) .Marland KitchenMarland KitchenMarland Kitchen  Take As Directed or See Bottom of Med Calendar For Specific Tapering Instructions 17)  Duoneb 0.5-2.5 (3) Mg/24ml  Soln (Ipratropium-Albuterol) .Marland Kitchen.. 1 Neb Four Times A Day 18)  Pulmicort 0.5 Mg/22ml  Susp (Budesonide) .... Use 1 Vial Two Times A Day in Nebulizer. 19)  Oxygen 2 Liters .... At Bedtime 20)  Citrucel  Powd (Methylcellulose (Laxative)) .... 1/2 Scoop At Bedtime 21)  Nasal Spray 0.65 % Soln (Saline) .... 2 Puffs Two Times A Day As Needed 22)  Proventil Hfa 108 (90 Base) Mcg/act  Aers (Albuterol Sulfate) .... 2 Puffs Every 4 Hours As Needed 23)  Diazepam 5 Mg  Tabs (Diazepam) .... Two Times A Day As Needed 24)   Oxygen .... Wear 2liters As Needed 25)  Prednisone 10 Mg  Tabs (Prednisone) .... Increase To 2 Tabs Until Back To Your Baseline.  Then 1 Tab Daily and Hold. 26)  Maalox Regular Strength 200-200-20 Mg/80ml Susp (Alum & Mag Hydroxide-Simeth) .... As Directed 27)  Phillips Milk of Magnesia 7.75 % Susp (Magnesium Hydroxide) .... 3 Tablespoonfuls At Bedtime 28)  Vicodin 5-500 Mg Tabs (Hydrocodone-Acetaminophen) .... 1/2 -1 By Mouth Every 6 Hr As Needed Pain 29)  Calcitonin (Salmon) 200 Unit/act Soln (Calcitonin (Salmon)) .Marland Kitchen.. 1 Spray Each Nostril Daily  Allergies (verified): 1)  ! * Spiriva 2)  ! Sulfa 3)  ! Penicillin 4)  ! * Statins  Past History:  Past Medical History: OSTEOPOROSIS (ICD-733.00) HYPERGLYCEMIA (ICD-790.29) HYPERLIPIDEMIA (ICD-272.4) GERD (ICD-530.81) COPD UNSPECIFIED (ICD-496)    - PFTs 09/05/07 FEV1 49% with improvement by 8% ratio 39% and diffusing capacity 58%   - prednisone -dependent since 3/09   - HFA poor February 04, 2009  HYPOTHYROIDISM (ICD-244.9) T9 Compression Fx with slt cord deformity      - MRI 04/12/10  COMPLEX MEDICAL REGIMEN    - Med calendar redone June 25, 2008  >> Sep 27, 2008     - Not using calendar February 04, 2009 > not using May 03, 2009   Vital Signs:  Patient profile:   77 year old female Weight:      135 pounds O2 Sat:      99 % on 2 L/min Temp:     97.7 degrees F oral Pulse rate:   79 / minute BP sitting:   120 / 60  (left arm)  Vitals Entered By: Vernie Murders (April 20, 2010 1:59 PM)  O2 Flow:  2 L/min  Physical Exam  Additional Exam:   chronically ill wheelchair bound  white female anxious, nad o/w  wt  138 December 21, 2008 > 136 February 04, 2009 >> 139 March 16, 2010  >>135 April 03, 2010 > 135 April 20, 2010   HEENT mild turbinate edema.  Oropharynx no thrush or excess pnd or cobblestoning.  No JVD or cervical adenopathy. Mild accessory muscle hypertrophy. Trachea midline, nl thryroid. Chest was  hyperinflated by percussion with diminished breath sounds and marked increased exp time without wheeze. tender along mid  t-spine no eccymosis or rash noted. ,  Regular rate and rhythm without murmur gallop or rub or increase P2 or edema.  Decrease s1s2 Abd: no hsm, nl excursion. Ext warm without cyanosis or clubbing Sodium               [L]  134 mEq/L                   135-145   Potassium  4.8 mEq/L                   3.5-5.1   Chloride             [L]  94 mEq/L                    96-112   Carbon Dioxide            30 mEq/L                    19-32   Glucose              [L]  64 mg/dL                    81-19   BUN                       17 mg/dL                    1-47   Creatinine                0.8 mg/dL                   8.2-9.5   Calcium                   9.3 mg/dL                   6.2-13.0   GFR                       73.65 mL/min                >60.00  Tests: (2) CBC Platelet w/Diff (CBCD)   White Cell Count          7.4 K/uL                    4.5-10.5   Red Cell Count       [L]  3.67 Mil/uL                 3.87-5.11   Hemoglobin                12.1 g/dL                   86.5-78.4   Hematocrit           [L]  35.0 %                      36.0-46.0   MCV                       95.2 fl                     78.0-100.0   MCHC                      34.5 g/dL                   69.6-29.5   RDW                       12.9 %  11.5-14.6   Platelet Count            283.0 K/uL                  150.0-400.0   Neutrophil %         [H]  89.2 %                      43.0-77.0     verified by smear   Lymphocyte %         [L]  7.5 %                       12.0-46.0   Monocyte %                3.2 %                       3.0-12.0   Eosinophils%              0.1 %                       0.0-5.0   Basophils %               0.0 %                       0.0-3.0   Neutrophill Absolute      6.6 K/uL                    1.4-7.7   Lymphocyte Absolute  [L]  0.6 K/uL                     0.7-4.0   Monocyte Absolute         0.2 K/uL                    0.1-1.0  Eosinophils, Absolute                             0.0 K/uL                    0.0-0.7   Basophils Absolute        0.0 K/uL                    0.0-0.1  Tests: (3) Hepatic/Liver Function Panel (HEPATIC)   Total Bilirubin      [L]  0.2 mg/dL                   2.1-3.0   Direct Bilirubin          0.1 mg/dL                   8.6-5.7   Alkaline Phosphatase      94 U/L                      39-117   AST                       27 U/L                      0-37   ALT  21 U/L                      0-35   Total Protein             6.6 g/dL                    0.1-0.2   Albumin                   3.6 g/dL                    7.2-5.3  Tests: (4) TSH (TSH)   FastTSH                   2.36 uIU/mL                 0.35-5.50  Tests: (5) Total Bilirubin (TBIL)   Total Bilirubin      [L]  0.2 mg/dL                   6.6-4.4  Impression & Recommendations:  Problem # 1:  COPD UNSPECIFIED (ICD-496) GOLD III 02 and steroid dep     DDX of  difficult airways managment all start with A and  include Adherence, Ace Inhibitors, Acid Reflux, Active Sinus Disease, Alpha 1 Antitripsin deficiency, Anxiety masquerading as Airways dz,  ABPA,  allergy(esp in young), Aspiration (esp in elderly), Adverse effects of DPI,  Active smokers, plus one B  = Beta blocker use..  and one C = CHF   Adherence very questionable, can't keep up with med calendar, self rx'ing prednisone.  Anxiety also a major issues. Reviewed as needed use of valium and all her prns using the ruby slippers analogy (don't wait until the end of the movie to learn how the slippers work)  No evidence of chf -in fact probably on too much furosemide though no evidence for dehydration clinically  Problem # 2:  HYPERGLYCEMIA (ICD-790.29)  Overtreated at present but using SSI with no hypoglycemic symptoms  Orders: Est. Patient Level IV (03474)  Problem # 3:  BACK PAIN  (ICD-724.5) T9 compression fx with no significant cord compression and pain better  Problem # 4:  ABDOMINAL PAIN, UNSPECIFIED (ICD-789.00)  May be worsened with ca carbonate since creates more GI gas > try simple change to citrate   Orders: Est. Patient Level IV (25956)  Medications Added to Medication List This Visit: 1)  Calcitonin (salmon) 200 Unit/act Soln (Calcitonin (salmon)) .Marland Kitchen.. 1 spray each nostril daily  Other Orders: TLB-BMP (Basic Metabolic Panel-BMET) (80048-METABOL) TLB-CBC Platelet - w/Differential (85025-CBCD) TLB-Hepatic/Liver Function Pnl (80076-HEPATIC) TLB-TSH (Thyroid Stimulating Hormone) (84443-TSH) TLB-Bilirubin, Total (82247-TBIL)  Patient Instructions: 1)  for breathlessless try diazepam 5mg  onehalf to whole every 6 hours as needed 2)  stop calcium carbonate 3)  Calcium citrate 1500 mg per day 4)  Hold furosmide if getting too dry 5)  See Tammy NP w/in 2 weeks with all your medications, even over the counter meds, separated in two separate bags, the ones you take no matter what vs the ones you stop once you feel better and take only as needed.  She will generate for you a new user friendly medication calendar that will put [place] all on the same page re: your medication use.

## 2010-06-15 NOTE — Progress Notes (Signed)
Summary: refill vicodin  Phone Note Refill Request Call back at [facility]  Message from:  Fax from Pharmacy on April 12, 2010 9:38 AM  Refills Requested: Medication #1:  VICODIN 5-500 MG TABS 1/2 -1 by mouth every 6 hr as needed pain.   Dosage confirmed as above?Dosage Confirmed   Last Refilled: 04/03/2010 received refill request via fax from pharmacy.  per TP: okay to fill x1, #20.  written on rx and faxed back to pharmacy.   Method Requested: Fax to Local Pharmacy Initial call taken by: Boone Master CNA/MA,  April 12, 2010 9:44 AM

## 2010-06-15 NOTE — Progress Notes (Signed)
Summary: ok for discharge per Dr Jerelyn Scott instructions  Phone Note Call from Patient   Caller: Daughter--joyce Call For: tammy parrett Reason for Call: Talk to Nurse Summary of Call: Patient's daughter requesting to speak to Tammy Parrett about her mother.  Her mother is currently in the hospital.  Call back number is [phone]. Initial call taken by: Lehman Prom,  May 19, 2010 10:36 AM  Follow-up for Phone Call        [facility] TCB x1 for [place]. Boone Master CNA/MA  May 19, 2010 11:43 AM   joyce called back---MW has seen her mother 1 time in the hospital and she is not really sure if anyone has been to see her again--she wanted her mother checked prior to her discharge from [facility] room 1315 tomorrow to make sure that her lungs are ok and she is good to go home.  explained that i would forward this message to MW to make him aware [address] [facility]  May 19, 2010 12:08 PM  Discussed with Dr Johnella Moloney, no change in Rx needed Follow-up by: Nyoka Cowden MD,  May 19, 2010 1:04 PM

## 2010-06-15 NOTE — Progress Notes (Signed)
Summary: verteberalplasty referral -- will need MRI ASAP  Phone Note From Other Clinic   Caller: Dr. Mendel Ryder Call For: wert/parrett Summary of Call: [facility] calling about referral we sent over for evaluation for verterbralplasty.  Jewel states that patient will have to have MRI done before this can done.  If patient can go ahead and get MRI done they could work her in [place]. Initial call taken by: Lehman Prom,  April 11, 2010 8:19 AM  Follow-up for Phone Call        Spoke with Jewel.  Per Jewel, pt will need MRI prior to appt with their office.  Requesting this be done ASAP so they can work pt in on [place] or Fri of this week.  After MRI has been set up, Jewel requesting call back with this information.  TP, pls advise if ok to order MRI.  Thanks! Follow-up by: Gweneth Dimitri RN,  April 11, 2010 9:11 AM  Additional Follow-up for Phone Call Additional follow up Details #1::        sorry I was informed the referral to IR would automatically set up for MRI, no problem will order MRI stat, sorry for the confusion.  please have results sent to me .   Additional Follow-up by: Rubye Oaks NP,  April 11, 2010 9:46 AM     Appended Document: verteberalplasty referral -- will need MRI ASAP MRI scheduled at [facility] on [address] on 04/12/10 at 2:45pm - LMOMTCB to inform Jewel of this.    Appended Document: verteberalplasty referral -- will need MRI ASAP LMOM for Jewel TCB  Appended Document: verteberalplasty referral -- will need MRI ASAP Jewel advised and she states she will have Dr. Corliss Skains look at MRI and call us and let us know recs.

## 2010-06-15 NOTE — Progress Notes (Signed)
Summary: Spiriva > ok to restart  Phone Note Call from Patient Call back at Home Phone [phone] Call back at [phone]   Caller: Patient Call For: [name] Reason for Call: Talk to Nurse Summary of Call: pt wants to know if she can start using Spiriva again?  She had surgery on her eyes, doesn't have to worry about piriva bothering her eyes now. Initial call taken by: Eugene Gavia,  Sep 23, 2009 11:37 AM  Follow-up for Phone Call        Pt states she had surgery on her eyes and her docotor does not think spiriva will effect ehr vision any longer, so pt wants to know can she go back on Spiriva. Sh estates she felt so much better while she was on it in the past. Please advise.[name] CMA  Sep 23, 2009 11:42 AM that's fine  Follow-up by: Nyoka Cowden MD,  Sep 23, 2009 11:56 AM  Additional Follow-up for Phone Call Additional follow up Details #1::        pt advised. [name] CMA  Sep 23, 2009 12:00 PM

## 2010-06-15 NOTE — Progress Notes (Signed)
Summary: speak to nurse  Phone Note Call from Patient   Caller: Daughter--joyce--[phone] Call For: wert Reason for Call: Talk to Nurse Summary of Call: Patient's daughter, Alona Bene, is asking to speak to nurse about patient ov today. Initial call taken by: Lehman Prom,  April 20, 2010 10:27 AM  Follow-up for Phone Call        pt daughter staets that the pt seemsto be dehydrated and she  feels like she may need some IV fluids and wanted to know if Dr. Sherene Sires could order this if needed. Pt has appt today to see MW. I advised IF he felt this was needed it is something he could order.  They will keep appt at 2pm to see MW. The appt is for pulmonary issues not dehydration.  [name] CMA  April 20, 2010 11:00 AM

## 2010-06-15 NOTE — Assessment & Plan Note (Signed)
Summary: Acute NP office visit - emphysema   Primary Provider/Referring Provider:  Johnella Moloney  CC:  intermittent sharp bilat rib pain and states began 3days after a fall 1 month ago.  states having painful inhalation x1week.  History of Present Illness: 77  yowf quit smoking in the year 2002 with onset doe and a mild chronic cough.  She subsequently gained 15 pounds and beginning about the middle of 2006 began to have exacerbations with coughing wheezing and shortness of breath which required short course of prednisone to get her back to baseline but requiring  Prednisone daily since  3/09.   December 22, 2007 ov no decrease in ex tol albeit on oxygen at 24 hours a day and maintaining prednisone at 10 mg daily with gradual improvement in dyspnea.  March 16, 2010  c/o sharp pain substernally radiating back along lower rib cage, c/o dyspnea , no wheeze, no signs of cold - NOt taking spiriva Steroid dependent, takes pulmicort & duonebs  Told at [facility] that she has clogged arteries causing abdominal pain EKG - nSR with RBBB& left bifascicular block - q waves in V1-2 related to BBB ? vs old MI  April 03, 2010 --Presents for follow up. Had fall 3 weeks ago. She lost her balance in closet 10/29 and fell forward. Had bilateral sharp rib pain w/ radiation into back. CXR w/ no acute changes. Seen at urgent care 2 weeks go, tx w/ advil. She is no better. Has sudden sharp pain in ribs bialterally w/ pain into back-catching pain w/ sudden movement. Denies chest pain, dyspnea, orthopnea, hemoptysis, fever, n/v/d, edema, headache. Her to breathe in at times.   Medications Prior to Update: 1)  Sertraline Hcl 50 Mg  Tabs (Sertraline Hcl) .... 1/2 Tab By Mouth Once Daily 2)  Benicar 20 Mg Tabs (Olmesartan Medoxomil) .... 1/2 Once Daily 3)  Centrum Silver   Tabs (Multiple Vitamins-Minerals) .... Once Daily 4)  Caltrate 600+d Plus 600-400 Mg-Unit  Tabs (Calcium Carbonate-Vit D-Min) .... Two Times A  Day 5)  Vitamin D3 1000 Unit  Tabs (Cholecalciferol) .... Two Times A Day 6)  Vitamin C 1000 Mg Tabs (Ascorbic Acid) .... Take 1 Tablet By Mouth Two Times A Day 7)  Potassium Gluconate 595 Mg  Tbcr (Potassium Gluconate) .... Once Daily 8)  Omeprazole 20 Mg Cpdr (Omeprazole) .Marland Kitchen.. 1 Capsule By Mouth 30 Minutes Before First and Last Meals of The Day 9)  Magnesium 500 Mg Tabs (Magnesium) .Marland Kitchen.. 1 By Mouth Daily 10)  Vitamin B-12 1000 Mcg Tabs (Cyanocobalamin) .... Take 1/2 Tablet By Mouth Once A Day 11)  Humalog Mix 75/25 75-25 % Susp (Insulin Lispro Prot & Lispro) .... 40 Units Every Am and 10 Units At Bedtime*******sliding Scale******* 12)  Novolog 100 Unit/ml  Soln (Insulin Aspart) .... Sliding Scale With Lunch 13)  Furosemide 40 Mg  Tabs (Furosemide) .Marland Kitchen.. 1 1/2 Tab Daily 14)  Levothyroxine Sodium 88 Mcg  Tabs (Levothyroxine Sodium) .... Take 1 Tablet By Mouth Once A Day 15)  Aspirin Adult Low Strength 81 Mg  Tbec (Aspirin) .... 2 Tabs By Mouth Once Daily 16)  Prednisone 10 Mg Tabs (Prednisone) .... Take As Directed or See Bottom of Med Calendar For Specific Tapering Instructions 17)  Duoneb 0.5-2.5 (3) Mg/81ml  Soln (Ipratropium-Albuterol) .Marland Kitchen.. 1 Neb Four Times A Day 18)  Pulmicort 0.5 Mg/54ml  Susp (Budesonide) .... Use 1 Vial Two Times A Day in Nebulizer. 19)  Oxygen 2 Liters .... At Bedtime 20)  Citrucel  Powd (Methylcellulose (Laxative)) .... 1/2 Scoop At Bedtime 21)  Nasal Spray 0.65 % Soln (Saline) .... 2 Puffs Two Times A Day As Needed 22)  Proventil Hfa 108 (90 Base) Mcg/act  Aers (Albuterol Sulfate) .... 2 Puffs Every 4 Hours As Needed 23)  Diazepam 5 Mg  Tabs (Diazepam) .... Two Times A Day As Needed 24)  Oxygen .... Wear 2liters As Needed 25)  Prednisone 10 Mg  Tabs (Prednisone) .... Increase To 2 Tabs Until Back To Your Baseline.  Then 1 Tab Daily and Hold. 26)  Maalox Regular Strength 200-200-20 Mg/47ml Susp (Alum & Mag Hydroxide-Simeth) .... As Directed 27)  Phillips Milk of Magnesia  7.75 % Susp (Magnesium Hydroxide) .... 3 Tablespoonfuls At Bedtime  Current Medications (verified): 1)  Sertraline Hcl 50 Mg  Tabs (Sertraline Hcl) .... 1/2 Tab By Mouth Once Daily 2)  Benicar 20 Mg Tabs (Olmesartan Medoxomil) .... 1/2 Once Daily 3)  Centrum Silver   Tabs (Multiple Vitamins-Minerals) .... Once Daily 4)  Caltrate 600+d Plus 600-400 Mg-Unit  Tabs (Calcium Carbonate-Vit D-Min) .... Two Times A Day 5)  Vitamin D3 1000 Unit  Tabs (Cholecalciferol) .... Two Times A Day 6)  Vitamin C 1000 Mg Tabs (Ascorbic Acid) .... Take 1 Tablet By Mouth Two Times A Day 7)  Potassium Gluconate 595 Mg  Tbcr (Potassium Gluconate) .... Once Daily 8)  Omeprazole 20 Mg Cpdr (Omeprazole) .Marland Kitchen.. 1 Capsule By Mouth 30 Minutes Before First and Last Meals of The Day 9)  Magnesium 500 Mg Tabs (Magnesium) .Marland Kitchen.. 1 By Mouth Daily 10)  Vitamin B-12 1000 Mcg Tabs (Cyanocobalamin) .... Take 1/2 Tablet By Mouth Once A Day 11)  Humalog Mix 75/25 75-25 % Susp (Insulin Lispro Prot & Lispro) .... Sliding Scale With Each Meal 12)  Novolog 100 Unit/ml  Soln (Insulin Aspart) .... Sliding Scale With Lunch 13)  Furosemide 40 Mg  Tabs (Furosemide) .Marland Kitchen.. 1 1/2 Tab Daily 14)  Levothyroxine Sodium 88 Mcg  Tabs (Levothyroxine Sodium) .... Take 1 Tablet By Mouth Once A Day 15)  Aspirin Adult Low Strength 81 Mg  Tbec (Aspirin) .... 2 Tabs By Mouth Once Daily 16)  Prednisone 10 Mg Tabs (Prednisone) .... Take As Directed or See Bottom of Med Calendar For Specific Tapering Instructions 17)  Duoneb 0.5-2.5 (3) Mg/72ml  Soln (Ipratropium-Albuterol) .Marland Kitchen.. 1 Neb Four Times A Day 18)  Pulmicort 0.5 Mg/92ml  Susp (Budesonide) .... Use 1 Vial Two Times A Day in Nebulizer. 19)  Oxygen 2 Liters .... At Bedtime 20)  Citrucel  Powd (Methylcellulose (Laxative)) .... 1/2 Scoop At Bedtime 21)  Nasal Spray 0.65 % Soln (Saline) .... 2 Puffs Two Times A Day As Needed 22)  Proventil Hfa 108 (90 Base) Mcg/act  Aers (Albuterol Sulfate) .... 2 Puffs Every 4  Hours As Needed 23)  Diazepam 5 Mg  Tabs (Diazepam) .... Two Times A Day As Needed 24)  Oxygen .... Wear 2liters As Needed 25)  Prednisone 10 Mg  Tabs (Prednisone) .... Increase To 2 Tabs Until Back To Your Baseline.  Then 1 Tab Daily and Hold. 26)  Maalox Regular Strength 200-200-20 Mg/16ml Susp (Alum & Mag Hydroxide-Simeth) .... As Directed 27)  Phillips Milk of Magnesia 7.75 % Susp (Magnesium Hydroxide) .... 3 Tablespoonfuls At Bedtime  Allergies (verified): 1)  ! * Spiriva 2)  ! Sulfa 3)  ! Penicillin 4)  ! * Statins  Past History:  Past Medical History: Last updated: 05/03/2009 OSTEOPOROSIS (ICD-733.00) HYPERGLYCEMIA (ICD-790.29) HYPERLIPIDEMIA (ICD-272.4) GERD (ICD-530.81)  COPD UNSPECIFIED (ICD-496)    - PFTs 09/05/07 FEV1 49% with improvement by 8% ratio 39% and diffusing capacity 58%   - prednisone -dependent since 3/09   - HFA poor February 04, 2009  HYPOTHYROIDISM (ICD-244.9) COMPLEX MEDICAL REGIMEN    - Med calendar redone June 25, 2008  >> Sep 27, 2008     - Not using calendar February 04, 2009 > not using May 03, 2009   Past Surgical History: Last updated: 02/04/2009 status post remote cholecystectomy Fx Left Ankle 10/12/2008 > clapp's > hcap to [facility] > rehab > discharge 12/09/08  Family History: Last updated: 11/07/2007 emphysema in brothers and father all smokers no thyroid dz  Social History: Last updated: 04/03/2010 quit smoking 2002 - x45yrs, 1.5ppd no alcohol  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  Risk Factors: Smoking Status: quit (03/16/2010)  Social History: quit smoking 2002 - x46yrs, 1.5ppd no alcohol  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  states having painful inhalation x1week Is Patient Diabetic? No Comments Medications reviewed with patient Daytime contact number verified with patient. Boone Master CNA/MA  April 03, 2010 2:59 PM    Physical Exam  Additional Exam:   chronically ill  ambulatory white female in no acute distress    wt  139  10/9/9  >  145 July 12, 2008 > 138 December 21, 2008 > 136 February 04, 2009 >> 139 March 16, 2010  >>135 April 03, 2010   HEENT mild turbinate edema.  Oropharynx no thrush or excess pnd or cobblestoning.  No JVD or cervical adenopathy. Mild accessory muscle hypertrophy. Trachea midline, nl thryroid. Chest was hyperinflated by percussion with diminished breath sounds and marked increased exp time without wheeze. tender along mid  t-spine no eccymosis or rash noted. ,  Regular rate and rhythm without murmur gallop or rub or increase P2 or edema.  Decrease s1s2 Abd: no hsm, nl excursion. Ext warm without cyanosis or clubbing   Impression & Recommendations:  Problem # 1:  BACK PAIN (ICD-724.5) Rib and back pain . ? new compression fx she is on steroids -puts her at higher risk of fx.  will xray t/l spine, if positive send for mri for possible kyphoplasty.  pain management.  Plan:  We are checking xrays of your back.  I will call results.  Vicodin 1/2-1 every 6 hr as needed pain, may make you sleepy, take with food,  Stop Advil (she is on chronic steroids, and prevent pot. gastritis)  follow up Dr. Sherene Sires as scheduled  Please contact office for sooner follow up if symptoms do not improve or worsen   Medications Added to Medication List This Visit: 1)  Humalog Mix 75/25 75-25 % Susp (Insulin lispro prot & lispro) .... Sliding scale with each  meal 2)  Vicodin 5-500 Mg Tabs  (Hydrocodone-acetaminophen) .... 1/2 -1 by mouth every 6 hr as needed pain  Complete Medication List: 1)  Sertraline Hcl 50 Mg Tabs (Sertraline hcl) .... 1/2 tab by mouth once daily 2)  Benicar 20 Mg Tabs (Olmesartan medoxomil) .... 1/2 once daily 3)  Centrum Silver Tabs (Multiple vitamins-minerals) .... Once daily 4)  Caltrate 600+d Plus 600-400 Mg-unit Tabs (Calcium carbonate-vit d-min) .... Two times a day 5)  Vitamin D3 1000 Unit Tabs (Cholecalciferol) .... Two times a day 6)  Vitamin C 1000 Mg Tabs (Ascorbic acid) .... Take 1 tablet by mouth two times a day 7)  Potassium Gluconate 595 Mg Tbcr (Potassium gluconate) .... Once daily 8)  Omeprazole 20 Mg Cpdr (Omeprazole) .Marland Kitchen.. 1 capsule by mouth 30 minutes before first and last meals of the day 9)  Magnesium 500 Mg Tabs (Magnesium) .Marland Kitchen.. 1 by mouth daily 10)  Vitamin B-12 1000 Mcg Tabs (Cyanocobalamin) .... Take 1/2 tablet by mouth once a day 11)  Humalog Mix 75/25 75-25 % Susp (Insulin lispro prot & lispro) .... Sliding scale with each meal 12)  Novolog 100 Unit/ml Soln (Insulin aspart) .... Sliding scale with lunch 13)  Furosemide 40 Mg Tabs (Furosemide) .Marland Kitchen.. 1 1/2 tab daily 14)  Levothyroxine Sodium 88 Mcg Tabs (Levothyroxine sodium) .... Take 1 tablet by mouth once a day 15)  Aspirin Adult Low Strength 81 Mg Tbec (Aspirin) .... 2 tabs by mouth once daily 16)  Prednisone 10 Mg Tabs (Prednisone) .... Take as directed or see bottom of med calendar for specific tapering instructions 17)  Duoneb 0.5-2.5 (3) Mg/79ml Soln (Ipratropium-albuterol) .Marland Kitchen.. 1 neb four times a day 18)  Pulmicort 0.5 Mg/68ml Susp (Budesonide) .... Use 1 vial two times a day in nebulizer. 19)  Oxygen 2 Liters  .... At bedtime 20)  Citrucel Powd (Methylcellulose (laxative)) .... 1/2 scoop at bedtime 21)  Nasal Spray 0.65 % Soln (Saline) .... 2 puffs two times a day as needed 22)  Proventil Hfa 108 (90 Base) Mcg/act Aers (Albuterol sulfate) .... 2 puffs every 4 hours as  needed 23)  Diazepam 5 Mg Tabs (Diazepam) .... Two times a day as needed 24)  Oxygen  .... Wear 2liters as needed 25)  Prednisone 10 Mg Tabs (Prednisone) .... Increase to 2 tabs until back to your baseline.  then 1 tab daily and hold. 26)  Maalox Regular Strength 200-200-20 Mg/30ml Susp (Alum & mag hydroxide-simeth) .... As directed 27)  Phillips Milk of Magnesia 7.75 % Susp (Magnesium hydroxide) .... 3 tablespoonfuls at bedtime 28)  Vicodin 5-500 Mg Tabs (Hydrocodone-acetaminophen) .... 1/2 -1 by mouth every 6 hr as needed pain  Other Orders: T-Thoracic Spine 2 Views [phone]) T-Lumbar Spine 2 Views ([id number]) Est. Patient Level IV (11914)  Patient Instructions: 1)  We are checking xrays of your back.  2)  I will call results.  3)  Vicodin 1/2-1 every 6 hr as needed pain, may make you sleepy, take with food,  4)  Stop Advil  5)  follow up Dr. Sherene Sires as scheduled  6)  Please contact office for sooner follow up if symptoms do not improve or worsen  Prescriptions: VICODIN 5-500 MG TABS (HYDROCODONE-ACETAMINOPHEN) 1/2 -1 by mouth every 6 hr as needed pain  #20 x 0   Entered and Authorized by:   Rubye Oaks NP   Signed by:   Tyannah Sane NP on 04/03/2010   Method used:   Print then Give to Patient  RxID:   [id number]

## 2010-06-15 NOTE — Medication Information (Signed)
[facility]: [facility]   Imported By: Lester [place] 07/08/2009 08:10:21  _____________________________________________________________________  External Attachment:    Type:   Image     Comment:   External Document

## 2010-06-15 NOTE — Progress Notes (Signed)
Summary: speak to Tammy  Phone Note Call from Patient Call back at [phone]   Caller: Daughter//joyce Call For: parrett Summary of Call: Wants to speak to TP in ref to her mom's broken back, says Tammy knows about this. Initial call taken by: Darletta Moll,  May 04, 2010 12:07 PM  Follow-up for Phone Call        I believe this was routed to me by mistake.  Will route to [employer]. Follow-up by: Coralyn Helling MD,  May 04, 2010 12:22 PM  Additional Follow-up for Phone Call Additional follow up Details #1::        Spoke with pt's daughter.  She states that pt's back pain is not improving and therefore is considering "cement placed in back"- wants to know if this is okay from a pulm perspective.  Pls advise thanks Additional Follow-up by: Vernie Murders,  May 04, 2010 2:36 PM    Additional Follow-up for Phone Call Additional follow up Details #2::    yes we are aware of Dr. Alla Feeling.  daughter is going to discuss with mom and decide if they are  going to proceed with verebroplasty -as her pain is still present.  Follow-up by: Rubye Oaks NP,  May 04, 2010 2:43 PM

## 2010-06-15 NOTE — Progress Notes (Signed)
Summary: rx  Phone Note Call from Patient Call back at Home Phone [phone]   Caller: Patient Call For: wert Reason for Call: Talk to Nurse Summary of Call: need rx for Duo Neb and Budesonide called in to pharmacy - need this called in today, pt is almost out. -  [facility] Initial call taken by: Eugene Gavia,  July 29, 2009 3:52 PM  Follow-up for Phone Call        Form signed by MW and faxed on 07/06/09 for a yearly supply for these meds. called, spoke with Aurther Loft at [facility].  Per Aurther Loft, they did not receive it.  requesting to have it refaxed to [phone]. Form refaxed.  Pt aware.  Gweneth Dimitri RN  July 29, 2009 4:29 PM

## 2010-06-15 NOTE — Progress Notes (Signed)
Summary: results  Phone Note Call from Patient Call back at Home Phone [phone]   Caller: Patient Call For: parrett Summary of Call: wants results of test confused from this morning Initial call taken by: Lehman Prom,  April 13, 2010 3:19 PM  Follow-up for Phone Call        Spoke with pt.  She states that she was confused about her her MR spine test showed.  She would like TP to call her again to explain these to her.  Pls advise thanks! Follow-up by: Vernie Murders,  April 13, 2010 3:37 PM  Additional Follow-up for Phone Call Additional follow up Details #1::        MRI showed compression fx We are waiting for IR to call back , we called them and radilogist is looking over her MRI and is suppose to call them with appointment if not head by lunch tomorrow.  pleaase calll back   Additional Follow-up by: Rubye Oaks NP,  April 13, 2010 4:05 PM    Additional Follow-up for Phone Call Additional follow up Details #2::    Spoke with pt and notified of the above recs per SN.  Pt verbalized understanding. Follow-up by: Vernie Murders,  April 13, 2010 4:10 PM

## 2010-06-15 NOTE — Progress Notes (Signed)
Summary: sick-congestion  Phone Note Call from Patient Call back at Home Phone [phone]   Caller: Daughter, [name] Call For: [name] Summary of Call: pt's daughter, [name] calling on pt's behalf.  States pt c/o congestion, unable to cough up sputum, non-productive cough, wheezing, "wore out" and weak.  Denies fever.  Symptoms started yesterday.   Daughter states she called on-call physician and pt was prescribed z pak which pt has started.  Daughter states pt also takes 10mg  of Prednisone but daughter has increased this to 20mg  daily.  Daughter requesting pt be seen today.  Please advise.  Thanks.Arman Filter LPN  May 09, 2010 10:37 AM \\par  Allergies (verified):  1)  ! * Spiriva 2)  ! Sulfa 3)  ! Penicillin 4)  ! * Statins  Initial call taken by: Arman Filter LPN,  May 09, 2010 10:38 AM  Follow-up for Phone Call        pt's daughter, [name], called back again wanting to know the doc's recs.  Please advise.  Aundra Millet Kohlbeck LPN  May 09, 2010 12:02 PM     Per CDY-continue prednisone 20mg  and Zpak as currently doing; if gets too concerned then she should call back for appt with MD or NP tomorrow or go to ER/Urgent Care today.Reynaldo Minium CMA  May 09, 2010 12:16 PM   Additional Follow-up for Phone Call Additional follow up Details #1::        LMTCBx1 on [phone] (given by pt spouse). [name] CMA  May 09, 2010 12:36 PM  Pt daughter advised of recs as stated above per CY. [name] CMA  May 09, 2010 12:42 PM

## 2010-06-15 NOTE — Progress Notes (Signed)
Summary: mri  Phone Note Call from Patient Call back at Home Phone [phone]   Caller: Patient Call For: parrett Summary of Call: ? appt for mri Initial call taken by: Lacinda Axon,  April 10, 2010 10:22 AM  Follow-up for Phone Call        The patient says she has not heard from anyone re: appt w/ specialist.  Th e pt says she is in severe pain and needs to see someone quickly. I believe the pt is talking about IR referral but I do not see an order for this. Pls advise.[name] [facility]  April 10, 2010 12:35 PM  Additional Follow-up for Phone Call Additional follow up Details #1::        working on with IR, order sent today pt aware  to call back if not heard from by 4 pm  Additional Follow-up by: Rubye Oaks NP,  April 10, 2010 12:38 PM

## 2010-06-15 NOTE — Progress Notes (Signed)
Summary: returned call  Phone Note Call from Patient Call back at Home Phone [phone]   Caller: Patient Call For: wert Summary of Call: pt says she spoke to a nurse this morning and was told to call back if she didn't hear from someone by noon. (very unclear what this is regarding since pt spoke to nurse yesterday).  Initial call taken by: Tivis Ringer, CNA,  April 14, 2010 3:26 PM  Follow-up for Phone Call        spoke to jewel in dr deveshwar's office she is the one who had called the pt and she will call her to set this verbeoplasty for tuesday 04/18/10 Follow-up by: Oneita Jolly,  April 14, 2010 3:39 PM

## 2010-06-15 NOTE — Progress Notes (Signed)
Summary: Pt in hosp  Phone Note Call from Patient   Caller: daughter/Joyce Call For: dr. Maple Hudson Summary of Call: Ms Thad Ranger daughter Alona Bene phoned stated that she was returning a call from [facility] in triage. she can be reached [phone] Initial call taken by: Vedia Coffer,  May 16, 2010 1:46 PM  Follow-up for Phone Call        Daughter wanted to let us know that pt is in hospital room#  1315 at [facility]. Sh ewanted to make sure Dr. Sherene Sires knew this and to cancel appt today with TP. Appt cancelled. i will forward message to MW. [name] CMA  May 16, 2010 2:08 PM aware Follow-up by: Nyoka Cowden MD,  May 16, 2010 3:52 PM

## 2010-06-21 ENCOUNTER — Telehealth (INDEPENDENT_AMBULATORY_CARE_PROVIDER_SITE_OTHER): Payer: Self-pay | Admitting: *Deleted

## 2010-06-29 NOTE — Progress Notes (Signed)
Summary: script not signed need verbal  Phone Note From Pharmacy   Caller: miranda with [facility] Call For: wert  Summary of Call: miranda with [facility] pharmacy phoned regarding a prescription for Budesonide  that she received. She needs a verbal because this was not signed. Please call Miranda at [phone] Initial call taken by: Vedia Coffer,  June 21, 2010 3:42 PM  Follow-up for Phone Call        Rx was sent on 06-14-10 and wasnt signed;she is faxing the Rx to me so we can double check Rx and call with verbal or have MW sign.Reynaldo Minium CMA  June 21, 2010 5:14 PM   I have put Rx  in look at for [facility] to sign then fax back.Reynaldo Minium CMA  June 21, 2010 5:21 PM ok Follow-up by: Nyoka Cowden MD,  June 21, 2010 5:23 PM  Additional Follow-up for Phone Call Additional follow up Details #1::        Faxed Rx to number on paperwork.Reynaldo Minium CMA  June 21, 2010 5:27 PM

## 2010-06-30 ENCOUNTER — Telehealth: Payer: Self-pay | Admitting: Internal Medicine

## 2010-06-30 ENCOUNTER — Encounter: Payer: Self-pay | Admitting: Adult Health

## 2010-07-05 NOTE — Miscellaneous (Signed)
Summary: albuterol sent to the pharmacy  Clinical Lists Changes  Medications: Added new medication of ALBUTEROL SULFATE (2.5 MG/3ML) 0.083% NEBU (ALBUTEROL SULFATE) use one vial in nebulizer four times daily - Signed Rx of ALBUTEROL SULFATE (2.5 MG/3ML) 0.083% NEBU (ALBUTEROL SULFATE) use one vial in nebulizer four times daily;  #120 x 1;  Signed;  Entered by: Randell Loop CMA;  Authorized by: Rubye Oaks NP;  Method used: Telephoned to [facility], [address], [address], [place], [place]  [address]  [place], [place]: [phone], Fax: [phone]    Prescriptions: ALBUTEROL SULFATE (2.5 MG/3ML) 0.083% NEBU (ALBUTEROL SULFATE) use one vial in nebulizer four times daily  #120 x 1   Entered by:   Randell Loop CMA   Authorized by:   Rubye Oaks NP   Signed by:   Randell Loop CMA on 06/30/2010   Method used:   Telephoned to ...       [facility] (retail)       [address]       [address]       [place], [place]  [address]  [place]       Ph: [phone]       Fax: [phone]   RxID:   [id number]

## 2010-07-05 NOTE — Progress Notes (Signed)
Summary: spiriva  Phone Note Call from Patient   Caller: Daughter--joyce--[phone] Call For: tammy p Reason for Call: Refill Medication Summary of Call: Alona Bene, pt's daughter, calling asking to speak to Tammy P.  She said that they wanted to try spiriva again, but they are needing to know if they need to stop any other meds before doing this. Initial call taken by: Lehman Prom,  June 30, 2010 11:06 AM  Follow-up for Phone Call        called and spoke with joyce--pts daughter and she stated that the pt is wanting to try spiriva again---she tried this before and c/o small amount of blurry vision but this med worked very good for her.  daughter wanted to know if pt will need to stop any other meds prior to restarting the spiriva.  please advise. thanks Randell Loop CMA  June 30, 2010 2:04 PM   Additional Follow-up for Phone Call Additional follow up Details #1::        yes would have to change DuoNEb to albuterol only four times a day #120, as needed as needed  that is fine if she watns to restart Spriiva #1 1 puffs once daily  needs ov follow up in office with Merion Caton at some time in future  ~3-4 weeks  Additional Follow-up by: Rubye Oaks NP,  June 30, 2010 2:22 PM    Additional Follow-up for Phone Call Additional follow up Details #2::    called andspoke with pts daughter and she is aware per TP--to stop the duoneb and can start the spiriva---so the spiriva has been called to the pharmacy---appt has been made for pt to see MW on 3-6 at 11:15. Randell Loop CMA  June 30, 2010 2:58 PM   New/Updated Medications: SPIRIVA HANDIHALER 18 MCG CAPS (TIOTROPIUM BROMIDE MONOHYDRATE) inhale one capsule once daily Prescriptions: SPIRIVA HANDIHALER 18 MCG CAPS (TIOTROPIUM BROMIDE MONOHYDRATE) inhale one capsule once daily  #30 x 1   Entered by:   Randell Loop CMA   Authorized by:   Rubye Oaks NP   Signed by:   Randell Loop CMA on 06/30/2010   Method used:   Telephoned to  ...       [facility] (retail)       [address]       [address]       [place], [place]  [address]  [place]       Ph: [phone]       Fax: [phone]   RxID:   [id number]

## 2010-07-18 ENCOUNTER — Ambulatory Visit: Payer: Self-pay | Admitting: Internal Medicine

## 2010-07-24 LAB — CK TOTAL AND CKMB (NOT AT ARMC)
Relative Index: INVALID (ref 0.0–2.5)
Total CK: 86 U/L (ref 7–177)

## 2010-07-24 LAB — GLUCOSE, CAPILLARY
Glucose-Capillary: 138 mg/dL — ABNORMAL HIGH (ref 70–99)
Glucose-Capillary: 140 mg/dL — ABNORMAL HIGH (ref 70–99)
Glucose-Capillary: 226 mg/dL — ABNORMAL HIGH (ref 70–99)
Glucose-Capillary: 246 mg/dL — ABNORMAL HIGH (ref 70–99)
Glucose-Capillary: 366 mg/dL — ABNORMAL HIGH (ref 70–99)
Glucose-Capillary: 403 mg/dL — ABNORMAL HIGH (ref 70–99)

## 2010-07-24 LAB — DIFFERENTIAL
Basophils Absolute: 0 10*3/uL (ref 0.0–0.1)
Basophils Absolute: 0 10*3/uL (ref 0.0–0.1)
Basophils Relative: 0 % (ref 0–1)
Basophils Relative: 0 % (ref 0–1)
Eosinophils Absolute: 0 10*3/uL (ref 0.0–0.7)
Eosinophils Relative: 0 % (ref 0–5)
Lymphocytes Relative: 4 % — ABNORMAL LOW (ref 12–46)
Lymphs Abs: 0.3 10*3/uL — ABNORMAL LOW (ref 0.7–4.0)
Monocytes Absolute: 0.4 10*3/uL (ref 0.1–1.0)
Monocytes Relative: 3 % (ref 3–12)
Neutro Abs: 7.8 10*3/uL — ABNORMAL HIGH (ref 1.7–7.7)
Neutro Abs: 9.3 10*3/uL — ABNORMAL HIGH (ref 1.7–7.7)
Neutrophils Relative %: 90 % — ABNORMAL HIGH (ref 43–77)
Neutrophils Relative %: 91 % — ABNORMAL HIGH (ref 43–77)

## 2010-07-24 LAB — URINE MICROSCOPIC-ADD ON

## 2010-07-24 LAB — BASIC METABOLIC PANEL
CO2: 28 mEq/L (ref 19–32)
Calcium: 9.2 mg/dL (ref 8.4–10.5)
Creatinine, Ser: 0.75 mg/dL (ref 0.4–1.2)
GFR calc Af Amer: >60 mL/min (ref >60)
GFR calc non Af Amer: 53 mL/min — ABNORMAL LOW (ref >60)
GFR calc non Af Amer: >60 mL/min (ref >60)
Glucose, Bld: 207 mg/dL — ABNORMAL HIGH (ref 70–99)
Glucose, Bld: 217 mg/dL — ABNORMAL HIGH (ref 70–99)
Potassium: 4.7 mEq/L (ref 3.5–5.1)
Sodium: 134 mEq/L — ABNORMAL LOW (ref 135–145)
Sodium: 135 mEq/L (ref 135–145)

## 2010-07-24 LAB — COMPREHENSIVE METABOLIC PANEL
AST: 24 U/L (ref 0–37)
Albumin: 3.1 g/dL — ABNORMAL LOW (ref 3.5–5.2)
Alkaline Phosphatase: 89 U/L (ref 39–117)
BUN: 19 mg/dL (ref 6–23)
CO2: 28 mEq/L (ref 19–32)
Calcium: 8.6 mg/dL (ref 8.4–10.5)
Chloride: 96 mEq/L (ref 96–112)
Creatinine, Ser: 0.9 mg/dL (ref 0.4–1.2)
GFR calc Af Amer: >60 mL/min (ref >60)
GFR calc non Af Amer: 60 mL/min — ABNORMAL LOW (ref >60)
Glucose, Bld: 110 mg/dL — ABNORMAL HIGH (ref 70–99)
Potassium: 4.9 mEq/L (ref 3.5–5.1)
Total Bilirubin: 0.4 mg/dL (ref 0.3–1.2)
Total Protein: 6.4 g/dL (ref 6.0–8.3)

## 2010-07-24 LAB — CBC
Hemoglobin: 11.7 g/dL — ABNORMAL LOW (ref 12.0–15.0)
MCH: 31.4 pg (ref 26.0–34.0)
MCHC: 33 g/dL (ref 30.0–36.0)
MCHC: 33.2 g/dL (ref 30.0–36.0)
MCV: 95.2 fL (ref 78.0–100.0)
Platelets: 267 10*3/uL (ref 150–400)
Platelets: 289 10*3/uL (ref 150–400)
Platelets: 326 10*3/uL (ref 150–400)
RBC: 3.69 MIL/uL — ABNORMAL LOW (ref 3.87–5.11)
RBC: 3.73 MIL/uL — ABNORMAL LOW (ref 3.87–5.11)
WBC: 8.1 10*3/uL (ref 4.0–10.5)

## 2010-07-24 LAB — URINE CULTURE

## 2010-07-24 LAB — TROPONIN I: Troponin I: 0.03 ng/mL (ref 0.00–0.06)

## 2010-07-24 LAB — URINALYSIS, ROUTINE W REFLEX MICROSCOPIC
Ketones, ur: NEGATIVE mg/dL
Nitrite: NEGATIVE
Specific Gravity, Urine: 1.019 (ref 1.005–1.030)
pH: 6.5 (ref 5.0–8.0)

## 2010-07-24 LAB — HEMOGLOBIN A1C: Hgb A1c MFr Bld: 7.4 % — ABNORMAL HIGH (ref <5.7)

## 2010-07-24 LAB — GLUCOSE, RANDOM: Glucose, Bld: 377 mg/dL — ABNORMAL HIGH (ref 70–99)

## 2010-08-17 ENCOUNTER — Encounter: Payer: Self-pay | Admitting: Internal Medicine

## 2010-08-20 LAB — CBC
HCT: 31 % — ABNORMAL LOW (ref 36.0–46.0)
HCT: 30.7 % — ABNORMAL LOW (ref 36.0–46.0)
HCT: 32.9 % — ABNORMAL LOW (ref 36.0–46.0)
Hemoglobin: 10.5 g/dL — ABNORMAL LOW (ref 12.0–15.0)
Hemoglobin: 11.3 g/dL — ABNORMAL LOW (ref 12.0–15.0)
Hemoglobin: 11.4 g/dL — ABNORMAL LOW (ref 12.0–15.0)
MCHC: 35 g/dL (ref 30.0–36.0)
MCHC: 34.6 g/dL (ref 30.0–36.0)
MCV: 94.5 fL (ref 78.0–100.0)
MCV: 94.7 fL (ref 78.0–100.0)
MCV: 94.6 fL (ref 78.0–100.0)
Platelets: 234 10*3/uL (ref 150–400)
Platelets: 255 10*3/uL (ref 150–400)
Platelets: 220 10*3/uL (ref 150–400)
RBC: 3.25 MIL/uL — ABNORMAL LOW (ref 3.87–5.11)
RBC: 3.48 MIL/uL — ABNORMAL LOW (ref 3.87–5.11)
RDW: 13.5 % (ref 11.5–15.5)
RDW: 14.3 % (ref 11.5–15.5)
WBC: 6.1 10*3/uL (ref 4.0–10.5)
WBC: 6.3 10*3/uL (ref 4.0–10.5)
WBC: 9.3 10*3/uL (ref 4.0–10.5)
WBC: 4.5 10*3/uL (ref 4.0–10.5)

## 2010-08-20 LAB — GLUCOSE, CAPILLARY
Glucose-Capillary: 108 mg/dL — ABNORMAL HIGH (ref 70–99)
Glucose-Capillary: 120 mg/dL — ABNORMAL HIGH (ref 70–99)
Glucose-Capillary: 123 mg/dL — ABNORMAL HIGH (ref 70–99)
Glucose-Capillary: 142 mg/dL — ABNORMAL HIGH (ref 70–99)
Glucose-Capillary: 144 mg/dL — ABNORMAL HIGH (ref 70–99)
Glucose-Capillary: 172 mg/dL — ABNORMAL HIGH (ref 70–99)
Glucose-Capillary: 176 mg/dL — ABNORMAL HIGH (ref 70–99)
Glucose-Capillary: 176 mg/dL — ABNORMAL HIGH (ref 70–99)
Glucose-Capillary: 179 mg/dL — ABNORMAL HIGH (ref 70–99)
Glucose-Capillary: 180 mg/dL — ABNORMAL HIGH (ref 70–99)
Glucose-Capillary: 183 mg/dL — ABNORMAL HIGH (ref 70–99)
Glucose-Capillary: 189 mg/dL — ABNORMAL HIGH (ref 70–99)
Glucose-Capillary: 193 mg/dL — ABNORMAL HIGH (ref 70–99)
Glucose-Capillary: 196 mg/dL — ABNORMAL HIGH (ref 70–99)
Glucose-Capillary: 222 mg/dL — ABNORMAL HIGH (ref 70–99)
Glucose-Capillary: 231 mg/dL — ABNORMAL HIGH (ref 70–99)
Glucose-Capillary: 233 mg/dL — ABNORMAL HIGH (ref 70–99)
Glucose-Capillary: 245 mg/dL — ABNORMAL HIGH (ref 70–99)
Glucose-Capillary: 248 mg/dL — ABNORMAL HIGH (ref 70–99)
Glucose-Capillary: 261 mg/dL — ABNORMAL HIGH (ref 70–99)
Glucose-Capillary: 272 mg/dL — ABNORMAL HIGH (ref 70–99)
Glucose-Capillary: 272 mg/dL — ABNORMAL HIGH (ref 70–99)
Glucose-Capillary: 284 mg/dL — ABNORMAL HIGH (ref 70–99)
Glucose-Capillary: 309 mg/dL — ABNORMAL HIGH (ref 70–99)
Glucose-Capillary: 312 mg/dL — ABNORMAL HIGH (ref 70–99)
Glucose-Capillary: 356 mg/dL — ABNORMAL HIGH (ref 70–99)
Glucose-Capillary: 360 mg/dL — ABNORMAL HIGH (ref 70–99)
Glucose-Capillary: 385 mg/dL — ABNORMAL HIGH (ref 70–99)
Glucose-Capillary: 48 mg/dL — ABNORMAL LOW (ref 70–99)
Glucose-Capillary: 65 mg/dL — ABNORMAL LOW (ref 70–99)
Glucose-Capillary: 68 mg/dL — ABNORMAL LOW (ref 70–99)
Glucose-Capillary: 73 mg/dL (ref 70–99)
Glucose-Capillary: 96 mg/dL (ref 70–99)
Glucose-Capillary: 211 mg/dL — ABNORMAL HIGH (ref 70–99)
Glucose-Capillary: 222 mg/dL — ABNORMAL HIGH (ref 70–99)
Glucose-Capillary: 225 mg/dL — ABNORMAL HIGH (ref 70–99)
Glucose-Capillary: 398 mg/dL — ABNORMAL HIGH (ref 70–99)

## 2010-08-20 LAB — COMPREHENSIVE METABOLIC PANEL
ALT: 18 U/L (ref 0–35)
ALT: 16 U/L (ref 0–35)
ALT: 17 U/L (ref 0–35)
AST: 21 U/L (ref 0–37)
AST: 34 U/L (ref 0–37)
Albumin: 2.7 g/dL — ABNORMAL LOW (ref 3.5–5.2)
Albumin: 2.6 g/dL — ABNORMAL LOW (ref 3.5–5.2)
Alkaline Phosphatase: 42 U/L (ref 39–117)
Alkaline Phosphatase: 46 U/L (ref 39–117)
BUN: 20 mg/dL (ref 6–23)
BUN: 17 mg/dL (ref 6–23)
CO2: 37 mEq/L — ABNORMAL HIGH (ref 19–32)
CO2: 33 mEq/L — ABNORMAL HIGH (ref 19–32)
Calcium: 9 mg/dL (ref 8.4–10.5)
Chloride: 95 mEq/L — ABNORMAL LOW (ref 96–112)
Chloride: 95 mEq/L — ABNORMAL LOW (ref 96–112)
Chloride: 96 mEq/L (ref 96–112)
Creatinine, Ser: 0.87 mg/dL (ref 0.4–1.2)
Creatinine, Ser: 0.86 mg/dL (ref 0.4–1.2)
GFR calc Af Amer: >60 mL/min (ref >60)
GFR calc Af Amer: >60 mL/min (ref >60)
GFR calc non Af Amer: >60 mL/min (ref >60)
Glucose, Bld: 138 mg/dL — ABNORMAL HIGH (ref 70–99)
Glucose, Bld: 163 mg/dL — ABNORMAL HIGH (ref 70–99)
Potassium: 3.4 mEq/L — ABNORMAL LOW (ref 3.5–5.1)
Potassium: 3.8 mEq/L (ref 3.5–5.1)
Potassium: 4.9 mEq/L (ref 3.5–5.1)
Sodium: 141 mEq/L (ref 135–145)
Sodium: 142 mEq/L (ref 135–145)
Total Bilirubin: 0.2 mg/dL — ABNORMAL LOW (ref 0.3–1.2)
Total Bilirubin: 0.3 mg/dL (ref 0.3–1.2)
Total Bilirubin: 0.6 mg/dL (ref 0.3–1.2)
Total Protein: 6.6 g/dL (ref 6.0–8.3)

## 2010-08-20 LAB — BASIC METABOLIC PANEL
CO2: 32 mEq/L (ref 19–32)
CO2: 32 mEq/L (ref 19–32)
Calcium: 8.7 mg/dL (ref 8.4–10.5)
Calcium: 9.1 mg/dL (ref 8.4–10.5)
Creatinine, Ser: 0.82 mg/dL (ref 0.4–1.2)
GFR calc Af Amer: >60 mL/min (ref >60)
GFR calc non Af Amer: >60 mL/min (ref >60)
Glucose, Bld: 182 mg/dL — ABNORMAL HIGH (ref 70–99)
Potassium: 3.9 mEq/L (ref 3.5–5.1)
Sodium: 139 mEq/L (ref 135–145)
Sodium: 141 mEq/L (ref 135–145)

## 2010-08-20 LAB — BLOOD GAS, ARTERIAL
Acid-Base Excess: 6 mmol/L — ABNORMAL HIGH (ref 0.0–2.0)
Bicarbonate: 30.8 mEq/L — ABNORMAL HIGH (ref 20.0–24.0)
Drawn by: 143801
TCO2: 32.4 mmol/L (ref 0–100)
pCO2 arterial: 51.4 mmHg — ABNORMAL HIGH (ref 35.0–45.0)
pO2, Arterial: 87.3 mmHg (ref 80.0–100.0)

## 2010-08-20 LAB — DIFFERENTIAL
Basophils Absolute: 0 10*3/uL (ref 0.0–0.1)
Basophils Absolute: 0.1 10*3/uL (ref 0.0–0.1)
Basophils Relative: 1 % (ref 0–1)
Eosinophils Absolute: 0 10*3/uL (ref 0.0–0.7)
Eosinophils Absolute: 0 10*3/uL (ref 0.0–0.7)
Eosinophils Relative: 0 % (ref 0–5)
Lymphocytes Relative: 36 % (ref 12–46)
Lymphs Abs: 2.7 10*3/uL (ref 0.7–4.0)
Monocytes Absolute: 0.7 10*3/uL (ref 0.1–1.0)
Monocytes Absolute: 0.7 10*3/uL (ref 0.1–1.0)
Monocytes Relative: 7 % (ref 3–12)
Neutro Abs: 6.6 10*3/uL (ref 1.7–7.7)
Neutro Abs: 8 10*3/uL — ABNORMAL HIGH (ref 1.7–7.7)
Neutrophils Relative %: 71 % (ref 43–77)
Neutrophils Relative %: 70 % (ref 43–77)

## 2010-08-20 LAB — URINALYSIS, ROUTINE W REFLEX MICROSCOPIC
Glucose, UA: NEGATIVE mg/dL
Leukocytes, UA: NEGATIVE
Protein, ur: NEGATIVE mg/dL
Specific Gravity, Urine: 1.007 (ref 1.005–1.030)
Urobilinogen, UA: 0.2 mg/dL (ref 0.0–1.0)

## 2010-08-20 LAB — POCT I-STAT 3, ART BLOOD GAS (G3+)
Acid-Base Excess: 9 mmol/L — ABNORMAL HIGH (ref 0.0–2.0)
Bicarbonate: 33.9 mEq/L — ABNORMAL HIGH (ref 20.0–24.0)
O2 Saturation: 100 %
TCO2: 35 mmol/L (ref 0–100)
pO2, Arterial: 194 mmHg — ABNORMAL HIGH (ref 80.0–100.0)

## 2010-08-20 LAB — CULTURE, BLOOD (ROUTINE X 2): Culture: NO GROWTH

## 2010-08-20 LAB — CARDIAC PANEL(CRET KIN+CKTOT+MB+TROPI)
CK, MB: 10 ng/mL — ABNORMAL HIGH (ref 0.3–4.0)
Relative Index: 4.2 — ABNORMAL HIGH (ref 0.0–2.5)
Total CK: 253 U/L — ABNORMAL HIGH (ref 7–177)
Total CK: 262 U/L — ABNORMAL HIGH (ref 7–177)
Troponin I: 1.05 ng/mL (ref 0.00–0.06)
Troponin I: 1.83 ng/mL (ref 0.00–0.06)

## 2010-08-20 LAB — POCT CARDIAC MARKERS
CKMB, poc: 4.5 ng/mL (ref 1.0–8.0)
Myoglobin, poc: 229 ng/mL (ref 12–200)

## 2010-08-20 LAB — LEGIONELLA ANTIGEN, URINE: Legionella Antigen, Urine: NEGATIVE

## 2010-08-20 LAB — CULTURE, RESPIRATORY W GRAM STAIN
Culture: NORMAL
Gram Stain: NONE SEEN

## 2010-08-20 LAB — BRAIN NATRIURETIC PEPTIDE: Pro B Natriuretic peptide (BNP): 1131 pg/mL — ABNORMAL HIGH (ref 0.0–100.0)

## 2010-08-20 LAB — URINE CULTURE

## 2010-08-20 LAB — EXPECTORATED SPUTUM ASSESSMENT W GRAM STAIN, RFLX TO RESP C

## 2010-08-20 LAB — URINE MICROSCOPIC-ADD ON

## 2010-08-21 LAB — BASIC METABOLIC PANEL
BUN: 22 mg/dL (ref 6–23)
BUN: 26 mg/dL — ABNORMAL HIGH (ref 6–23)
CO2: 31 mEq/L (ref 19–32)
Chloride: 95 mEq/L — ABNORMAL LOW (ref 96–112)
Chloride: 96 mEq/L (ref 96–112)
Creatinine, Ser: 0.95 mg/dL (ref 0.4–1.2)
Creatinine, Ser: 1.07 mg/dL (ref 0.4–1.2)
Glucose, Bld: 197 mg/dL — ABNORMAL HIGH (ref 70–99)

## 2010-08-21 LAB — GLUCOSE, CAPILLARY
Glucose-Capillary: 110 mg/dL — ABNORMAL HIGH (ref 70–99)
Glucose-Capillary: 124 mg/dL — ABNORMAL HIGH (ref 70–99)
Glucose-Capillary: 126 mg/dL — ABNORMAL HIGH (ref 70–99)
Glucose-Capillary: 128 mg/dL — ABNORMAL HIGH (ref 70–99)
Glucose-Capillary: 133 mg/dL — ABNORMAL HIGH (ref 70–99)
Glucose-Capillary: 254 mg/dL — ABNORMAL HIGH (ref 70–99)
Glucose-Capillary: 262 mg/dL — ABNORMAL HIGH (ref 70–99)
Glucose-Capillary: 56 mg/dL — ABNORMAL LOW (ref 70–99)
Glucose-Capillary: 81 mg/dL (ref 70–99)

## 2010-08-21 LAB — URINALYSIS, ROUTINE W REFLEX MICROSCOPIC
Bilirubin Urine: NEGATIVE
Glucose, UA: NEGATIVE mg/dL
Hgb urine dipstick: NEGATIVE
Specific Gravity, Urine: 1.007 (ref 1.005–1.030)
Urobilinogen, UA: 0.2 mg/dL (ref 0.0–1.0)
pH: 5.5 (ref 5.0–8.0)

## 2010-08-21 LAB — URINE CULTURE: Colony Count: 6000

## 2010-08-21 LAB — TYPE AND SCREEN: Antibody Screen: NEGATIVE

## 2010-08-21 LAB — TSH: TSH: 0.273 u[IU]/mL — ABNORMAL LOW (ref 0.350–4.500)

## 2010-08-21 LAB — CBC
MCV: 91.8 fL (ref 78.0–100.0)
Platelets: 296 10*3/uL (ref 150–400)
WBC: 10.3 10*3/uL (ref 4.0–10.5)

## 2010-08-21 LAB — PROTIME-INR: Prothrombin Time: 13 seconds (ref 11.6–15.2)

## 2010-08-22 ENCOUNTER — Ambulatory Visit (INDEPENDENT_AMBULATORY_CARE_PROVIDER_SITE_OTHER)
Admission: RE | Admit: 2010-08-22 | Discharge: 2010-08-22 | Disposition: A | Discharge status: Home / Self Care | Payer: Medicare Other | Source: Ambulatory Visit | Attending: Internal Medicine | Admitting: Internal Medicine

## 2010-08-22 ENCOUNTER — Ambulatory Visit (INDEPENDENT_AMBULATORY_CARE_PROVIDER_SITE_OTHER): Payer: Medicare Other | Admitting: Internal Medicine

## 2010-08-22 ENCOUNTER — Encounter: Payer: Self-pay | Admitting: Internal Medicine

## 2010-08-22 VITALS — BP 122/60 | HR 67 | Temp 97.6°F | Ht 62.0 in | Wt 129.6 lb

## 2010-08-22 DIAGNOSIS — J449 Chronic obstructive pulmonary disease, unspecified: Secondary | ICD-10-CM

## 2010-08-22 MED ORDER — FORMOTEROL FUMARATE 20 MCG/2ML IN NEBU: INHALATION_SOLUTION | RESPIRATORY_TRACT | Status: DC

## 2010-08-22 NOTE — Assessment & Plan Note (Addendum)
DDX of  difficult airways managment all start with A and  include Adherence, Ace Inhibitors, Acid Reflux, Active Sinus Disease, Alpha 1 Antitripsin deficiency, Anxiety masquerading as Airways dz,  ABPA,  allergy(esp in young), Aspiration (esp in elderly), Adverse effects of DPI,  Active smokers, plus two Bs  = Bronchiectasis and Beta blocker use..and one C= CHF   In this case Adherence is the biggest issue and starts with  inability to use HFA effectively and also  understand that SABA treats the symptoms but doesn't get to the underlying problem (inflammation).  I used  the analogy of putting steroid cream on a rash to help explain the meaning of topical therapy and the need to get the drug to the target tissue.   Try change to performist/ bud and add back sprivia     -The proper method of use, as well as anticipated side effects, of this Dry powder inhaler are discussed and demonstrated to the patient. This improved from 50-90% with coaching  ? Acid reflux > ppi and diet reviewed  ? Active/ acute sinusitis > sinus CT next

## 2010-08-22 NOTE — Patient Instructions (Addendum)
Nebulizer is   Performist 20 mcg  And budesonide 0.5 mg  placed in same nebulizer twice daily  Restart Spiriva daily after 1st nebulizer  If needed in between your treatments ok to use albuterol up to every 4 hours if needed for breathing.  Please see patient coordinator before you leave today  to schedule sinus ct next available  Omeprazole 20 mg Take 30- 60 min before your first and last meals of the day and Pepcid 20 mg at bedtime  GERD (REFLUX)  is an extremely common cause of respiratory symptoms, many times with no significant heartburn at all.    It can be treated with medication, but also with lifestyle changes including avoidance of late meals, excessive alcohol, smoking cessation, and avoid fatty foods, chocolate, peppermint, colas, red wine, and acidic juices such as orange juice.  NO MINT OR MENTHOL PRODUCTS SO NO COUGH DROPS  USE SUGARLESS CANDY INSTEAD (jolley ranchers or Stover's)  NO OIL BASED VITAMINS   See Tammy NP w/in 2 weeks with all your medications, even over the counter meds, separated in two separate bags, the ones you take no matter what vs the ones you stop once you feel better and take only as needed when you feel you need them.   Tammy  will generate for you a new user friendly medication calendar that will put [place] all on the same page re: your medication use.     Without this process, it simply isn't possible to assure that we are providing  your outpatient care  with  the attention to detail we feel you deserve.   If we cannot assure that you're getting that kind of care,  then we cannot manage your problem effectively from this clinic.  Once you have seen Tammy and we are sure that we're all on the same page with your medication use she will arrange follow up with me.

## 2010-08-22 NOTE — Progress Notes (Signed)
Subjective:    Patient ID: [name], female    DOB: [birth year], 77 y.o.   MRN: [id number]  HPI 35 yowf quit smoking 2002 with onset doe and a mild chronic cough. She subsequently gained 15 pounds and beginning about the middle of 2006 began to have exacerbations with coughing wheezing and shortness of breath which required short course of prednisone to get her back to baseline but requiring Prednisone daily since 3/09.   December 22, 2007 ov no decrease in ex tol albeit on oxygen at 24 hours a day and maintaining prednisone at 10 mg daily with gradual improvement in dyspnea.   March 16, 2010  c/o sharp pain substernally radiating back along lower rib cage, c/o dyspnea , no wheeze, no signs of cold - Not taking spiriva  Steroid dependent, takes pulmicort & duonebs  Told at [facility] that she has clogged arteries causing abdominal pain  EKG - nSR with RBBB& left bifascicular block - q waves in V1-2 related to BBB ? vs old MI   April 03, 2010 --Presents for follow up. Had fall 3 weeks ago. She lost her balance in closet 10/29 and fell forward. Had bilateral sharp rib pain w/ radiation into back. CXR w/ no acute changes. Seen at urgent care 2 weeks go, tx w/ advil. She is no better. Has sudden sharp pain in ribs bialterally w/ pain into back-catching pain w/ sudden movement. Denies chest pain, dyspnea, orthopnea, hemoptysis, fever, n/v/d, edema, headache. Her to breathe in at times. dx T 9 fx > Deveshwar eval, no rx.   April 20, 2010 ov cc sob x 3 weeks, sob at rest, no unusual cough/ congestion, does ok sleeping . Increased pred back to 20 mg per day with no benefit. no purulent sputum. back better, no leg c/ow just gen fatigue. Pt denies any significant sore throat, nasal congestion or excess secretions, fever, chills, sweats, unintended wt loss, pleuritic or exertional cp, orthopnea pnd or leg swelling.   Jan 2012 Hosp much better and 10 days of avelox >  Then cipro and able prednisone     Mid march prednisone up to 40 and another round of cipro >  Looser less yellow and pred down to 10 mg per day.  Confused with meds/ instructions and maint vs prn.  Pt denies any significant sore throat, dysphagia, itching, sneezing,  nasal congestion or excess/ purulent secretions,  fever, chills, sweats, unintended wt loss, pleuritic or exertional cp, hempoptysis, orthopnea pnd or leg swelling.    Also denies any obvious fluctuation of symptoms with weather or environmental changes or other aggravating or alleviating factors.  Thinks albuterol helps some and tends to overuse it.   Past Medical History:  OSTEOPOROSIS (ICD-733.00)  HYPERGLYCEMIA (ICD-790.29)  HYPERLIPIDEMIA (ICD-272.4)  GERD (ICD-530.81)  COPD UNSPECIFIED (ICD-496)  - PFTs 09/05/07 FEV1 49% with improvement by 8% ratio 39% and diffusing capacity 58%  - prednisone -dependent since 3/09  - HFA poor February 04, 2009  HYPOTHYROIDISM (ICD-244.9)  T9 Compression Fx with slt cord deformity  - MRI 04/12/10  COMPLEX MEDICAL REGIMEN  - Med calendar redone June 25, 2008 >> Sep 27, 2008  - Not using calendar February 04, 2009 > not using May 03, 2009                    Review of Systems     Objective:   Physical Exam chronically ill elderly white female anxious, nad o/w  wt 138 December 21, 2008 >  139 March 16, 2010 >>1 29 08/22/2010  HEENT mild turbinate edema. Oropharynx no thrush or excess pnd or cobblestoning. No JVD or cervical adenopathy. Mild accessory muscle hypertrophy. Trachea midline, nl thryroid. Chest was hyperinflated by percussion with diminished breath sounds and marked increased exp time without wheeze. tender along mid t-spine no eccymosis or rash noted. , Regular rate and rhythm without murmur gallop or rub or increase P2 or edema. Decrease s1s2 Abd: no hsm, nl excursion. Ext warm without cyanosis or clubbing         Assessment & Plan:

## 2010-08-24 LAB — POCT I-STAT, CHEM 8
BUN: 39 mg/dL — ABNORMAL HIGH (ref 6–23)
Calcium, Ion: 1.19 mmol/L (ref 1.12–1.32)
Chloride: 103 mEq/L (ref 96–112)
Glucose, Bld: 238 mg/dL — ABNORMAL HIGH (ref 70–99)
Potassium: 4.2 mEq/L (ref 3.5–5.1)

## 2010-08-24 LAB — HEMOGLOBIN A1C: Mean Plasma Glucose: 183 mg/dL

## 2010-08-24 LAB — CROSSMATCH
ABO/RH(D): A NEG
Antibody Screen: NEGATIVE

## 2010-08-24 LAB — CBC
HCT: 27.7 % — ABNORMAL LOW (ref 36.0–46.0)
HCT: 34.5 % — ABNORMAL LOW (ref 36.0–46.0)
Hemoglobin: 11.3 g/dL — ABNORMAL LOW (ref 12.0–15.0)
Hemoglobin: 8.8 g/dL — ABNORMAL LOW (ref 12.0–15.0)
MCHC: 31.9 g/dL (ref 30.0–36.0)
MCHC: 32.1 g/dL (ref 30.0–36.0)
MCV: 84.1 fL (ref 78.0–100.0)
MCV: 85.7 fL (ref 78.0–100.0)
Platelets: 263 10*3/uL (ref 150–400)
RBC: 3.27 MIL/uL — ABNORMAL LOW (ref 3.87–5.11)
RBC: 3.29 MIL/uL — ABNORMAL LOW (ref 3.87–5.11)
WBC: 6.2 10*3/uL (ref 4.0–10.5)
WBC: 8 10*3/uL (ref 4.0–10.5)
WBC: 8.8 10*3/uL (ref 4.0–10.5)

## 2010-08-24 LAB — DIFFERENTIAL
Lymphocytes Relative: 15 % (ref 12–46)
Lymphs Abs: 1.2 10*3/uL (ref 0.7–4.0)
Monocytes Absolute: 0.7 10*3/uL (ref 0.1–1.0)
Monocytes Relative: 8 % (ref 3–12)
Neutro Abs: 6.1 10*3/uL (ref 1.7–7.7)
Neutrophils Relative %: 76 % (ref 43–77)

## 2010-08-24 LAB — FERRITIN: Ferritin: 22 ng/mL (ref 10–291)

## 2010-08-24 LAB — TSH: TSH: 0.216 u[IU]/mL — ABNORMAL LOW (ref 0.350–4.500)

## 2010-08-24 LAB — ABO/RH: ABO/RH(D): A NEG

## 2010-08-24 LAB — BASIC METABOLIC PANEL
Chloride: 97 mEq/L (ref 96–112)
GFR calc non Af Amer: 54 mL/min — ABNORMAL LOW (ref >60)
Potassium: 4.3 mEq/L (ref 3.5–5.1)
Sodium: 140 mEq/L (ref 135–145)

## 2010-08-24 LAB — D-DIMER, QUANTITATIVE: D-Dimer, Quant: 0.69 ug/mL-FEU — ABNORMAL HIGH (ref 0.00–0.48)

## 2010-08-24 LAB — IRON AND TIBC
Saturation Ratios: 5 % — ABNORMAL LOW (ref 20–55)
Saturation Ratios: 5 % — ABNORMAL LOW (ref 20–55)
TIBC: 463 ug/dL (ref 250–470)
TIBC: 480 ug/dL — ABNORMAL HIGH (ref 250–470)
UIBC: 442 ug/dL

## 2010-08-24 LAB — GLUCOSE, CAPILLARY
Glucose-Capillary: 272 mg/dL — ABNORMAL HIGH (ref 70–99)
Glucose-Capillary: 274 mg/dL — ABNORMAL HIGH (ref 70–99)
Glucose-Capillary: 306 mg/dL — ABNORMAL HIGH (ref 70–99)

## 2010-08-24 LAB — CARDIAC PANEL(CRET KIN+CKTOT+MB+TROPI)
CK, MB: 2.8 ng/mL (ref 0.3–4.0)
Relative Index: INVALID (ref 0.0–2.5)
Troponin I: 0.04 ng/mL (ref 0.00–0.06)

## 2010-08-24 LAB — CK TOTAL AND CKMB (NOT AT ARMC)
CK, MB: 2.6 ng/mL (ref 0.3–4.0)
Relative Index: INVALID (ref 0.0–2.5)

## 2010-08-24 LAB — COMPREHENSIVE METABOLIC PANEL
AST: 22 U/L (ref 0–37)
BUN: 35 mg/dL — ABNORMAL HIGH (ref 6–23)
CO2: 31 mEq/L (ref 19–32)
Calcium: 9 mg/dL (ref 8.4–10.5)
Chloride: 94 mEq/L — ABNORMAL LOW (ref 96–112)
Creatinine, Ser: 1.07 mg/dL (ref 0.4–1.2)
GFR calc Af Amer: 59 mL/min — ABNORMAL LOW (ref >60)
GFR calc non Af Amer: 49 mL/min — ABNORMAL LOW (ref >60)
Total Bilirubin: 0.5 mg/dL (ref 0.3–1.2)

## 2010-08-24 LAB — RETICULOCYTES
RBC.: 3.37 MIL/uL — ABNORMAL LOW (ref 3.87–5.11)
Retic Count, Absolute: 47.2 10*3/uL (ref 19.0–186.0)
Retic Ct Pct: 1.4 % (ref 0.4–3.1)

## 2010-08-24 LAB — LIPID PANEL
Triglycerides: 41 mg/dL (ref <150)
VLDL: 8 mg/dL (ref 0–40)

## 2010-08-24 LAB — APTT: aPTT: 24 seconds (ref 24–37)

## 2010-08-24 LAB — POCT CARDIAC MARKERS
CKMB, poc: 1.3 ng/mL (ref 1.0–8.0)
Troponin i, poc: <0.05 ng/mL (ref 0.00–0.09)

## 2010-08-24 LAB — BRAIN NATRIURETIC PEPTIDE
Pro B Natriuretic peptide (BNP): 57 pg/mL (ref 0.0–100.0)
Pro B Natriuretic peptide (BNP): 67.8 pg/mL (ref 0.0–100.0)

## 2010-08-25 ENCOUNTER — Ambulatory Visit (INDEPENDENT_AMBULATORY_CARE_PROVIDER_SITE_OTHER)
Admission: RE | Admit: 2010-08-25 | Discharge: 2010-08-25 | Disposition: A | Discharge status: Home / Self Care | Payer: Medicare Other | Source: Ambulatory Visit | Attending: Internal Medicine | Admitting: Internal Medicine

## 2010-08-25 DIAGNOSIS — J449 Chronic obstructive pulmonary disease, unspecified: Secondary | ICD-10-CM

## 2010-08-25 NOTE — Progress Notes (Signed)
Quick Note:  Spoke with pt and notified of results per Dr. Wert. Pt verbalized understanding and denied any questions.  ______ 

## 2010-08-29 LAB — STREP A DNA PROBE: Group A Strep Probe: NEGATIVE

## 2010-08-29 LAB — CBC
HCT: 27.8 % — ABNORMAL LOW (ref 36.0–46.0)
Hemoglobin: 9.3 g/dL — ABNORMAL LOW (ref 12.0–15.0)
MCV: 86.5 fL (ref 78.0–100.0)
RBC: 3.22 MIL/uL — ABNORMAL LOW (ref 3.87–5.11)
WBC: 8.3 10*3/uL (ref 4.0–10.5)

## 2010-08-29 LAB — POCT I-STAT, CHEM 8
Calcium, Ion: 1.16 mmol/L (ref 1.12–1.32)
Creatinine, Ser: 1 mg/dL (ref 0.4–1.2)
Glucose, Bld: 163 mg/dL — ABNORMAL HIGH (ref 70–99)
HCT: 29 % — ABNORMAL LOW (ref 36.0–46.0)
Hemoglobin: 9.9 g/dL — ABNORMAL LOW (ref 12.0–15.0)
Potassium: 4.2 mEq/L (ref 3.5–5.1)

## 2010-08-29 LAB — DIFFERENTIAL
Eosinophils Absolute: 0.1 10*3/uL (ref 0.0–0.7)
Eosinophils Relative: 1 % (ref 0–5)
Lymphs Abs: 1.2 10*3/uL (ref 0.7–4.0)
Monocytes Absolute: 0.6 10*3/uL (ref 0.1–1.0)
Monocytes Relative: 8 % (ref 3–12)

## 2010-09-05 ENCOUNTER — Encounter: Payer: Self-pay | Admitting: Adult Health

## 2010-09-07 ENCOUNTER — Ambulatory Visit: Payer: Medicare Other | Admitting: Adult Health

## 2010-09-15 ENCOUNTER — Telehealth: Payer: Self-pay | Admitting: Adult Health

## 2010-09-15 MED ORDER — DOXYCYCLINE HYCLATE 100 MG PO TABS: 100.0000 mg | ORAL_TABLET | Freq: Two times a day (BID) | ORAL | Status: AC

## 2010-09-15 NOTE — Telephone Encounter (Signed)
Doxycycline 100mg  Twice daily  For 10 days  Please contact office for sooner follow up if symptoms do not improve or worsen or seek emergency care   If not improving will need to go to ER.  Mucinex dm Twice daily  As needed  coughcongestion

## 2010-09-15 NOTE — Telephone Encounter (Signed)
Spoke with pt's daughter and notified that rx was called to pharmacy and other recs per TP.  She verbalized understanding and denies any questions. Will go to ED if not improving.

## 2010-09-15 NOTE — Telephone Encounter (Signed)
Spoke with pt's daughter.  She states that pt has had prod cough with yellow sputum x 2 months- getting worse over the past few days.  Pt denies any SOB, CP, fever or other symptoms.  I advised ov but she is in [facility].  Pls advise thanks! Allergies  Allergen Reactions  . Penicillins   . Statins   . Sulfonamide Derivatives

## 2010-09-19 ENCOUNTER — Encounter: Payer: Self-pay | Admitting: Adult Health

## 2010-09-19 ENCOUNTER — Ambulatory Visit (INDEPENDENT_AMBULATORY_CARE_PROVIDER_SITE_OTHER): Payer: Medicare Other | Admitting: Adult Health

## 2010-09-19 VITALS — BP 138/64 | HR 76 | Temp 97.3°F | Ht <= 58 in | Wt 126.6 lb

## 2010-09-19 DIAGNOSIS — J449 Chronic obstructive pulmonary disease, unspecified: Secondary | ICD-10-CM

## 2010-09-19 MED ORDER — METHYLPREDNISOLONE ACETATE 80 MG/ML IJ SUSP
80.0000 mg | Freq: Once | INTRAMUSCULAR | Status: AC
  Administered 2010-09-19: 80 mg via INTRAMUSCULAR

## 2010-09-19 MED ORDER — HYDROCODONE-HOMATROPINE 5-1.5 MG/5ML PO SYRP: ORAL_SOLUTION | ORAL | Status: DC

## 2010-09-19 MED ORDER — PREDNISONE 10 MG PO TABS: ORAL_TABLET | ORAL | Status: AC

## 2010-09-19 MED ORDER — LEVALBUTEROL HCL 0.63 MG/3ML IN NEBU
0.6300 mg | INHALATION_SOLUTION | Freq: Once | RESPIRATORY_TRACT | Status: AC
  Administered 2010-09-19: 0.63 mg via RESPIRATORY_TRACT

## 2010-09-19 NOTE — Assessment & Plan Note (Signed)
Flare -slow to resolve  Plan:  Finish Doxycycline Increase Prednisone 40mg  daily x 5 days, 30mg  daily for 5 days, and hold at 20mg  until seen back in office.  Mucinex DM Twice daily  As needed  Cough/congestion  Fluids and rest  follow up 2 weeks and As needed   Please contact office for sooner follow up if symptoms do not improve or worsen or seek emergency care

## 2010-09-19 NOTE — Progress Notes (Signed)
Subjective:    Patient ID: [name], female    DOB: 1923-07-11, 77 y.o.   MRN: [id number]  HPI  58 yowf quit smoking 2002 with onset doe and a mild chronic cough. She subsequently gained 15 pounds and beginning about the middle of 2006 began to have exacerbations with coughing wheezing and shortness of breath which required short course of prednisone to get her back to baseline but requiring Prednisone daily since 3/09.   December 22, 2007 ov no decrease in ex tol albeit on oxygen at 24 hours a day and maintaining prednisone at 10 mg daily with gradual improvement in dyspnea.   March 16, 2010  c/o sharp pain substernally radiating back along lower rib cage, c/o dyspnea , no wheeze, no signs of cold - Not taking spiriva  Steroid dependent, takes pulmicort & duonebs  Told at [facility] that she has clogged arteries causing abdominal pain  EKG - nSR with RBBB& left bifascicular block - q waves in V1-2 related to BBB ? vs old MI   April 03, 2010 --Presents for follow up. Had fall 3 weeks ago. She lost her balance in closet 10/29 and fell forward. Had bilateral sharp rib pain w/ radiation into back. CXR w/ no acute changes. Seen at urgent care 2 weeks go, tx w/ advil. She is no better. Has sudden sharp pain in ribs bialterally w/ pain into back-catching pain w/ sudden movement. Denies chest pain, dyspnea, orthopnea, hemoptysis, fever, n/v/d, edema, headache. Her to breathe in at times. dx T 9 fx > Deveshwar eval, no rx.   April 20, 2010 ov cc sob x 3 weeks, sob at rest, no unusual cough/ congestion, does ok sleeping . Increased pred back to 20 mg per day with no benefit. no purulent sputum. back better, no leg c/ow just gen fatigue. Pt denies any significant sore throat, nasal congestion or excess secretions, fever, chills, sweats, unintended wt loss, pleuritic or exertional cp, orthopnea pnd or leg swelling.   Jan 2012 Hosp much better and 10 days of avelox >  Then cipro and able  prednisone   08/22/10 Mid march prednisone up to 40 and another round of cipro >  Looser less yellow and pred down to 10 mg per day.  Confused with meds/ instructions and maint vs prn.   >>rx perforomoist and spiriva   09/19/10 Acute OV Complains of prod cough with yellow mucus (clearing), wheezing - states feels "about the same" even with doxy rx given 5.4.12.  daughter reports increased pred to 20mg  on 5.4.12. She is some better and mucus is mostly clear.Marland Kitchen She was at beach last week when cough got worse. Cant stop coughing at times.  Worse at night.    Review of Systems Constitutional:   No  weight loss, night sweats,  Fevers, chills,  +fatigue, or  lassitude.  HEENT:   No headaches,  Difficulty swallowing,  Tooth/dental problems, or  Sore throat,                No sneezing, itching, ear ache, nasal congestion, post nasal drip,   CV:  No chest pain,  Orthopnea, PND, swelling in lower extremities, anasarca, dizziness, palpitations, syncope.   GI  No heartburn, indigestion, abdominal pain, nausea, vomiting, diarrhea, change in bowel habits, loss of appetite, bloody stools.   Resp:  No hemoptysis or  chest wall deformity  Skin: no rash or lesions.  GU: no dysuria, change in color of urine, no urgency or frequency.  No  flank pain, no hematuria   MS:  No joint pain or swelling.  No decreased range of motion.  No back pain.  Psych:  No change in mood or affect. No depression or anxiety.  No memory loss.          Objective:   Physical Exam GEN: A/Ox3; pleasant , NAD, elderly female   HEENT:  [place]/AT,  EACs-clear, TMs-wnl, NOSE-clear, THROAT-clear, no lesions, no postnasal drip or exudate noted.   NECK:  Supple w/ fair ROM; no JVD; normal carotid impulses w/o bruits; no thyromegaly or nodules palpated; no lymphadenopathy.  RESP  Coarse BS w/ scattered rhonchi, and few wheezing     CARD:  RRR, no m/r/g  , no peripheral edema, pulses intact, no cyanosis or clubbing.  GI:   Soft & nt;  nml bowel sounds; no organomegaly or masses detected.  Musco: Warm bil, no deformities or joint swelling noted.   Neuro: alert, no focal deficits noted.    Skin: Warm, no lesions or rashes            Assessment & Plan:

## 2010-09-19 NOTE — Patient Instructions (Signed)
Finish Doxycycline Increase Prednisone 40mg  daily x 5 days, 30mg  daily for 5 days, and hold at 20mg  until seen back in office.  Mucinex DM Twice daily  As needed  Cough/congestion  Fluids and rest  follow up 2 weeks and As needed   Please contact office for sooner follow up if symptoms do not improve or worsen or seek emergency care

## 2010-09-26 NOTE — Op Note (Signed)
NAMEMARQUELLE, [name]             ACCOUNT NO.:  [id number]   MEDICAL RECORD NO.:  [id number]          PATIENT TYPE:  INP   LOCATION:  1527                         FACILITY:  [facility]   PHYSICIAN:  Vania Rea. Supple, M.D.  DATE OF BIRTH:  October 25, 1923   DATE OF PROCEDURE:  DATE OF DISCHARGE:                               OPERATIVE REPORT   POSTOPERATIVE DIAGNOSIS:  Displaced left bimalleolar ankle fracture  dislocation.   POSTOPERATIVE DIAGNOSIS:  Displaced left bimalleolar ankle fracture  dislocation.   PROCEDURE:  Open reduction and internal fixation of displaced left  bimalleolar ankle fracture dislocation.   ASSISTANT:  Ralene Bathe PA-C.   ANESTHESIA:  Spinal.   TOURNIQUET TIME:  Approximately 1 hour.   ESTIMATED BLOOD LOSS:  Minimal.   DRAINS:  None.   HISTORY:  [name] is an 77 year old female with an extensive and  complex past medical history including severe COPD, steroid dependent,  as well as using home O2, hypertension, and insulin-dependent diabetes  who fell earlier today with immediate complaints of left ankle pain,  deformity, and inability to bear weight.  On presentation to the  emergency room, she was noted to have an obvious deformity and swollen  left ankle with radiographs confirming a left bimalleolar fracture  dislocation.  She is subsequently brought to the operating room at this  time for planned open reduction and internal fixation.   I preoperatively counseled [name] on treatment options as well as  risks versus benefits thereof.  Possible surgical complications  including of infection, neurovascular injury, malunion, nonunion, loss  of fixation, and possible need for additional surgery were reviewed.  She understands, accepts, and agrees to planned procedure.   PROCEDURE IN DETAIL:  After undergoing routine preoperative evaluation,  the patient received prophylactic antibiotics.  She was brought to the  operating room and in a sitting  position, had a spinal anesthetic  placed.  She was then placed supine on the table.  __________left thigh,  and the left leg was sterilely prepped and draped in standard fashion.  Time-out was called.  The leg was exsanguinated with a tourniquet  inflated to 300 mmHg.   We began with an anteromedial hockey stick incision about the medial  malleolus with sharp dissection carried down through skin and sub-Q and  deep dissection carried with blunt and sharp dissection, exposing the  medial malleolar fracture site, retracting the saphenous vein  anteriorly.  Interposed soft tissue was removed.  There was significant  comminution of the medial fracture site with an impaction of the  anterior margin of the distal tibia and a vertical split through the  posterior aspect of the fracture site proximally.  We carefully teased  the fracture pieces apart and then removed interposed clot and soft  tissue, visualized the talar dome, and copiously irrigated the  tibiotalar joint of residual debris.   We then turned our attention laterally where a longitudinal 8-cm  incision was made over the distal fibula and dissection carried deeply,  reflecting the peroneal musculature and tendons posteriorly and using  subperiosteal elevation to expose the fracture site and  the  posterolateral margin of the fibula.  The fracture site was irrigated.  All interposed soft tissue was meticulously removed.  The fracture was  then reduced.  A 7-hole, 1/3 tubular locking plate was then contoured to  fit over the posterolateral margin of the distal fibula.  This was then  transfixed using initially a lag screw across the fracture site x2 which  was through the plate using a standard 3.5 screw and then locking screws  placed proximally and distally, obtaining good bony purchase.  Fluoroscopic images were then obtained to confirm good alignment at the  fracture site and good position of the hardware.   We turned our  attention medially where the medial malleolar fracture  fragment was reduced.  Using fluoroscopic imaging, we confirmed proper  alignment and then passed 2 guide pins for the 4.0 cannulated screws  across the medial malleolus fracture site.  Proper position was  confirmed fluoroscopically.  We then placed two 44-mm screws with  washers across the medial malleolar fracture, obtaining good bony  purchase.  Final fluoroscopic images were then obtained which showed  congruent alignment of the tibiotalar joint, good alignment of the  fracture sites, and good position of the hardware.   Wound were then copiously irrigated.  The medial incision was closed  with interrupted vertical mattress sutures of 3-0 nylon.  The lateral  incision was closed with zero and 2-0 Vicryl to the deep sub-Q layers,  and intracuticular 3-0 Monocryl for the skin, followed by Steri-Strips.  A bulky well-padded dressing was applied over the left leg, and a well-  padded stirrup splint was applied with the ankle in neutral position.  The tourniquet was then let down.  The patient was then transferred to  the recovery room in stable condition.      Vania Rea. Supple, M.D.  Electronically Signed     KMS/MEDQ  D:  10/12/2008  T:  10/12/2008  Job:  [id number]

## 2010-09-26 NOTE — H&P (Signed)
NAMEBAMBIE, [name]             ACCOUNT NO.:  [id number]   MEDICAL RECORD NO.:  [id number]          PATIENT TYPE:  INP   LOCATION:  2920                         FACILITY:  [facility]   PHYSICIAN:  Hollice Espy, M.D.DATE OF BIRTH:  1923/12/14   DATE OF ADMISSION:  11/24/2008  DATE OF DISCHARGE:                              HISTORY & PHYSICAL   CHIEF COMPLAINT:  Shortness of breath.   HISTORY OF PRESENT ILLNESS:  [name] is an 77 year old female who  presents from the [facility] where she is  undergoing rehab following ORIF of the left ankle on October 12, 2008  performed by Dr. Rennis Chris.  The patient had a fever at the skilled nursing  facility of 101 two days ago which is currently resolved, however, her  wheezing has persisted.  Shortness of breath became severe this a.m.  prompting the patient's transfer to the ER.  Here in the emergency  department, the patient had a temperature of 103.5.  We are asked to  admit for further evaluation and treatment.   ALLERGIES:  1. The patient denies PENICILLIN allergy though this is listed in the      E-chart.  2. THEOPHYLLINE.  3. PREMPRO.  4. FLONASE.  5. BUDESONIDE.  6. ZETIA.  7. CHARCOAID.  8. ZEGERID.  9. STATIN INTOLERANT.  10.AVELOX.  11.SULFA.  12.PLAVIX.  13.SPIRIVA.   PAST MEDICAL HISTORY:  1. COPD.  2. Hypertension.  3. Diabetes type 2.  4. Iron-deficiency anemia.  5. Hypothyroid.  6. GERD.  7. Anxiety.  8. Vertigo.  9. CVA.  10.Peripheral vascular disease.   PAST SURGICAL HISTORY:  1. Cholecystectomy.  2. Carotid endarterectomy.   SOCIAL HISTORY:  The patient currently is residing in the short-term  skilled nursing facility.  She is married, former smoker, quit 9 years  ago.  Denies alcohol.   FAMILY HISTORY:  Noncontributory.   MEDICATIONS:  1. NovoLog sliding scale.  2. NovoLog 70/30 35 units subcu in the morning and 15 units subcu      before dinner.  3. Albuterol nebulizer  q.i.d.  4. Colace 100 mg p.o. b.i.d.  5. Pulmicort 0.25 mg nebulizer b.i.d.  6. Ferrex 150 one tablet p.o. b.i.d.  7. Protonix 40 mg p.o. b.i.d.  8. Synthroid 88 mcg p.o. daily.  9. Benicar 5 mg 2 tablets p.o. daily.  10.Actos 30 mg p.o. daily.  11.Prednisone 10 mg p.o. daily.  12.Zoloft 25 mg p.o. daily.  13.Lasix 60 mg p.o. daily, hold for BP less than or equal to 100.  14.Multivitamin 1 tablet p.o. daily.  15.Calcium 600 plus D one tablet p.o. daily.  16.Magnesium oxide 400 mg p.o. daily.  17.Percocet 5/325 one to two tablets p.o. q.4 h. p.r.n.  18.Proventil 2 puffs q.4 h. p.r.n.  19.Robaxin 500 mg p.o. q.6 h. p.r.n.  20.Tums calcium tablets chewable 2 tablets p.o. q.4 h. p.r.n. GERD.  21.Citrucel b.i.d.  22.Glucose gel p.r.n. CBG less than 60.   REVIEW OF SYSTEMS:  GENERAL:  Positive fever, positive sweats.  CARDIOVASCULAR:  Denies chest pain.  RESPIRATORY:  Positive shortness of  breath.  Positive cough productive of white phlegm.  ABDOMEN:  No  abdominal pain, no diarrhea.  MUSCULOSKELETAL:  No muscle or joint pain.  PSYCHIATRIC:  No depression.  No anxiety.  GU:  Positive dysuria.  Denies hematuria.  NEUROLOGIC:  Denies recent vision or hearing changes.  SKIN:  Denies rashes.  HEME:  Denies bruises or bleeding.   LABORATORY DATA/RADIOLOGY:  Hemoglobin 11.4 and hematocrit 32.9.  sodium  142, potassium 3.8, chloride 101, bicarb 33, BUN 17, creatinine 0.86,  and glucose 163.  Troponin 0.5.  Myoglobin 229 and MB 4.5.  Blood  cultures x2 are pending.  AST and ALT normal.  Chest x-ray:  Emphysema,  bibasilar atelectasis or scarring, question central pulmonary vascular  congestion.  EKG:  Some ST elevation in leads II and aVL.   PHYSICAL EXAMINATION:  VITAL SIGNS:  BP 140/64, heart rate 127,  respiratory rate 33, temperature 103.5, and O2 sat 100%.  GENERAL:  Elderly white female awake, alert, currently on BiPAP with  increased work of breathing.  She is warm to the touch.   NECK:  No masses are noted.  CARDIOVASCULAR:  S1-S2, increased heart rate.  RESPIRATORY:  Bibasilar crackles.  No wheezes.  Positive increased work  of breathing with use of abdominal accessory muscles and conversational  dyspnea.  ABDOMEN:  Mildly distended and nontender.  PSYCHIATRIC:  A and O x3, pleasant.  MUSCULOSKELETAL:  Her left foot in a walking boot.  No swelling or  erythema.  Sutures are intact.  NEUROLOGIC:  She is moving all extremities.  Speech is clear.   ASSESSMENT AND PLAN:  1. Acute respiratory failure in setting of probable pneumonia, acute      chronic obstructive pulmonary disease exacerbation, and sepsis.  We      will culture blood x2, check urine for Legionella and strep      pneumonia antigen.  We will also check sputum culture, urine      culture, and BMP post IV Zosyn and vancomycin.  Continuous nebs.      We placed her on Xopenex secondary to tachycardia.  Admit to Step-      Down Unit.  A Pulmonary consult has been requested.  She sees Dr.      Sandrea Hughs in the office.  ABG is pending.  We will check a CT      angio of the chest to rule out PE secondary to recent immobility.      We will also check bilateral lower extremity Dopplers.  2. Elevated troponin, rule out myocardial infarction.  We will repeat      cardiac enzymes at 3:00 p.m.  Consider cardiac consult if enzymes      remain elevated, suspect cardiac stress in setting of acute      respiratory failure.  3. Hypertension.  Blood pressure is stable.  Monitor closely in      setting of acute respiratory failure.  4. Diabetes type 2.  Sliding scale coverage.  She takes 55 units daily      of 70/30.  We will place her on Lantus 20 units while inpatient      plus sliding scale coverage and hold her oral agents until      tolerating p.o.'s without difficulty.  5. Status post open reduction and internal fixation of left ankle.      Will need Ortho followup with Dr. Rennis Chris when stable.  6. History of  hypothyroid.  Check TSH.  Continue home Synthroid.  7.  History of peripheral vascular disease, currently stable.  8. History of iron deficiency anemia.  Her hemoglobin is stable.      Continue oral iron.  9. Gastroesophageal reflux disease.  It is currently stable.  Continue      proton pump inhibitor.  10.History of anxiety.  This is stable.  11.History of vertigo.  This is stable.  12.History of cerebrovascular accident, currently at baseline.  13.Deep venous thrombosis prophylaxis.  We will add Lovenox subcu.  14.Code status.  This was discussed with the patient and the daughter      and they requested full code at this time.      Sandford Craze, NP      Hollice Espy, M.D.  Electronically Signed    MO/MEDQ  D:  11/24/2008  T:  11/25/2008  Job:  [id number]

## 2010-09-26 NOTE — Consult Note (Signed)
NAMEWISDOM, SEYBOLD NO.:  [id number]   MEDICAL RECORD NO.:  [id number]          PATIENT TYPE:  INP   LOCATION:  2920                         FACILITY:  [facility]   PHYSICIAN:  Cassell Clement, M.D. DATE OF BIRTH:  [birth year]   DATE OF CONSULTATION:  DATE OF DISCHARGE:                                 CONSULTATION   I was asked to see this patient regarding elevated cardiac enzymes by  Dr. Rito Ehrlich covering for Dr. Johnella Moloney.  This is an 77 year old  woman who was admitted earlier today from [facility].  She was  brought in because of severe dyspnea.  She required initial BiPAP.  It  was felt that the patient might have pneumonia, although her initial  chest x-ray does not show any infiltrate.  She was also noted to have a  bump in her cardiac enzymes.  The patient denies any chest pain and  denies any history of coronary artery disease or angina pectoris.  She  does have a long and well documented history of severe COPD related to  50 pack-a-year smoking history.  She finally quit smoking about 7 or 8  years ago.   PAST MEDICAL HISTORY:  Positive for:  1. COPD.  2. Hypertension.  3. Type 2 diabetes mellitus.  4. Hypothyroidism.  5. Gastroesophageal reflux disease.  6. Anxiety.  7. Vertigo.  8. Anemia.   ALLERGIES:  She has multiple drug allergies, which are listed in her  chart.  She had had open reduction internal fixation of left ankle on  October 12, 2008, by Dr. Rennis Chris and since then has been the patient at  [facility].  Several days ago, she had a fever of  101 at the nursing home which resolved.  However, wheezing persisted  until today where shortness of breath became worse.   FAMILY HISTORY:  Negative for coronary artery disease.  Positive for  cancer.   REVIEW OF SYSTEMS:  Positive for dysuria, but no hematuria and she has  had fever and sweats, but no chest pain.  She is not coughing up any  purulent sputum and she  has had no hemoptysis.   PHYSICAL EXAMINATION:  This evening, the patient has a blood pressure of  105/58, pulse of 88 with normal sinus rhythm with occasional PVC.  This  is an alert elderly woman in no distress.  The skin is clear.  Pupils  equal and reactive.  Sclerae nonicteric.  Jugular venous pressure  normal.  Carotids normal.  Thyroid normal.  Chest reveals expiratory  wheezes.  Heart reveals no murmur, gallop, rub, or click.  There is no  abnormal lift or heave.  The abdomen is soft without hepatosplenomegaly  or masses.  Extremities revealed that the right leg has no edema and  pedal pulses are present.  Left leg is wrapped in a soft cast and not  examined.   REVIEW OF SYSTEMS:  Otherwise negative in detail.   Her electrocardiogram taken this morning at 8:35 a.m. shows sinus  tachycardia at 130 per minute.  She has a pattern of left anterior  hemiblock and right bundle-branch block.  She has poor R-wave in V1  through V6.  There are no prior tracings available for comparison.  Her  initial lab work includes troponin of 0.53 rising to 1.83 and a CK-MB of  4.5 rising to 10.  Her chest x-ray shows borderline cardiomegaly on a  portable film.   IMPRESSION:  Slight bump in cardiac enzymes consistent with the stress  of marked sinus tachycardia and acute respiratory decompensation.  Doubt  primary cardiac event.   PLAN:  We will repeat an electrocardiogram tonight, now that her heart  rate has slowed down into the 90s and we will also get an EKG in the  morning.  We will follow up further cardiac enzymes in the morning.  A  chest x-ray and CT scan has been ordered to rule out pulmonary embolus  and is pending.   Thank you for asking me to see this patient.           ______________________________  Cassell Clement, M.D.     TB/MEDQ  D:  11/24/2008  T:  11/25/2008  Job:  [id number]   cc:   Candyce Churn, M.D.

## 2010-09-26 NOTE — Op Note (Signed)
[name], [name]             ACCOUNT NO.:  [id number]   MEDICAL RECORD NO.:  [id number]          PATIENT TYPE:  INP   LOCATION:  2550                         FACILITY:  [facility]   PHYSICIAN:  Balinda Quails, M.D.    DATE OF BIRTH:  1923/12/17   DATE OF PROCEDURE:  05/09/2007  DATE OF DISCHARGE:                               OPERATIVE REPORT   ASSISTANT:  Waverly Ferrari, MD and Gae Bon, PA.   ANESTHESIA:  General endotracheal.   ANESTHESIOLOGIST:  Dr. Gypsy Balsam   PREOPERATIVE DIAGNOSIS:  Severe left internal carotid artery stenosis.   POSTOPERATIVE DIAGNOSIS:  Severe left internal carotid artery stenosis.   PROCEDURE:  Left carotid endarterectomy with Dacron patch angioplasty.   CLINICAL NOTE:  Kamri Gotsch is an 77 year old female referred for  evaluation of extracranial cerebrovascular occlusive disease.  She had  suffered several episodes in the past of feelings of presyncope  associated with near blackouts, dizziness and tremulousness of the arms.  Workup for this revealed extensive extracranial cerebrovascular  occlusive disease.  Arteriography revealed a severe stenosis of the left  internal carotid artery.  A moderate stenosis of the right internal  carotid artery at 50-60%.  Bilateral vertebral artery origin stenoses  were also present.   The patient is brought the operating room at this time for left carotid  endarterectomy for reduction of stroke risk and relief of symptoms.   OPERATIVE PROCEDURE:  The patient was brought to the operating room in  stable condition.  The patient was placed under general endotracheal  anesthesia.  The left neck was prepped and draped in a sterile fashion.  Foley catheter and arterial line were in place.   A curvilinear skin incision was made along the anterior border of the  left sternomastoid muscle.  Dissection was carried down through  subcutaneous tissue with electrocautery.  Deep dissection was carried  down and the  jugular vein was identified.  Facial vein was ligated with  2-0 silk and divided.  The carotid bifurcation was exposed.  The vagus  nerve was identified posteriorly and preserved.  There was extensive  calcified plaque in the left carotid bulb extending into the left  internal carotid artery.  The left common carotid artery was exposed  down to the omohyoid muscle and encircled with vessel loop.  The origin  of the superior thyroid and external carotid were freed and encircled  with vessel loops.  The internal carotid artery was followed distally up  to the posterior belly of the digastric muscle.  An incidental lymph  node was biopsied in the left jugular digastric area.  The internal  carotid artery was mobilized distally.  The hypoglossal nerve was  reflected superiorly and preserved.  The distal internal carotid artery  was soft and encircled with a vessel loop.   The patient was administered 7,000 units of heparin intravenously.  The  carotid vessels were controlled with clamps.  A longitudinal arteriotomy  was made in the distal common carotid artery.  The arteriotomy extended  up into the carotid bulb.  There was extensive calcified plaque and a  severe  stenosis.  The lumen could not be identified.  Therefore, the  distal internal carotid artery was opened longitudinally through an  arteriotomy.  This allowed placement of a shunt.  Once the shunt was  placed, the arteriotomies were communicated.   There was extensive plaque heavily calcified in the distal bulb and left  internal carotid artery and a severe stenosis.   The plaque was removed with an endarterectomy elevator.  The  endarterectomy was carried proximally down to the common carotid artery  where it was divided transversely with Potts scissors.  The plaque then  raised up into the bulb of the superior thyroid and external carotid  were endarterectomized using an eversion technique.  The distal internal  carotid artery  plaque feathered out well.  Fragments of plaque were  removed with fine forceps.   The site was irrigated with heparin, saline and dextran solution.   The arteriotomy was then closed with a patch angioplasty using a Finesse  Dacron patch with a running 6-0 Prolene suture.  At completion of the  patch angioplasty, the shunt was removed, all vessels well flushed.  Clamps were removed.  Antegrade flowed up the external carotid artery.  Following this, the internal carotid was released.   Adequate pulse and Doppler signal in the distal internal carotid artery  was present.   The patient did receive 6,000 units of heparin at initiation of the  procedure, reversed with 40 mg of protamine intravenously.   The sternomastoid fascia was closed with running 2-0 Vicryl suture.  The  platysma was closed with running 3-0 Vicryl suture.  The skin was closed  with 4-0 Monocryl and Dermabond applied.   The patient did receive 10 mg of Decadron during the procedure due to  chronic lung disease and also an albuterol inhalation.   At the termination of the procedure, there was no significant wheezing  and the patient was extubated.  Neurologically intact.  Transferred to  the recovery room in stable condition.      Balinda Quails, M.D.  Electronically Signed     PGH/MEDQ  D:  05/09/2007  T:  05/09/2007  Job:  [id number]   cc:   Candyce Churn, M.D.  Marlan Palau, M.D.

## 2010-09-26 NOTE — Discharge Summary (Signed)
[name], [name]             ACCOUNT NO.:  [id number]   MEDICAL RECORD NO.:  [id number]          PATIENT TYPE:  IPS   LOCATION:  4007                         FACILITY:  [facility]   PHYSICIAN:  Ranelle Oyster, M.D.DATE OF BIRTH:  06/09/1923   DATE OF ADMISSION:  11/30/2008  DATE OF DISCHARGE:  12/09/2008                               DISCHARGE SUMMARY   DISCHARGE DIAGNOSES:  1. Deconditioning with chronic obstructive pulmonary disease and      pneumonia.  2. Left ankle fracture with open reduction and internal fixation on      October 12, 2008.  3. Subcutaneous Lovenox for deep vein thrombosis prophylaxis.  4. Pain management.  5. Hypertension.  6. Insulin-dependent diabetes mellitus.  7. Hypothyroidism.  8. Iron-deficiency anemia.  9. Anxiety with depression.  10.Gastroesophageal reflux disease.   This is an 77 year old white female, history of chronic obstructive  pulmonary disease with chronic oxygen, left ankle fracture with open  reduction and internal fixation on June 1 per Dr. Rennis Chris, was discharged  to [facility], admitted July 14 from skilled  nursing facility with increased shortness of breath and fever, chest x-  ray with pulmonary vascular congestion.  BNP of 1187.  Blood cultures  negative.  Troponin 1.83 with followup Cardiology Service, Dr.  Patty Sermons.  Echocardiogram with ejection fraction of 55% without emboli,  and myocardial infarction was ruled out.  Intravenous vancomycin and  Zosyn for empiric coverage of suspect pneumonia.  Subcutaneous Lovenox  for deep vein thrombosis prophylaxis.  She remained on albuterol  nebulizer treatments as well as high dose of prednisone for her chronic  obstructive pulmonary disease.  Followup Orthopedic Service, Dr. Rennis Chris  for recent left ankle fracture with CAM boot, discontinued on July 16,  increased weightbearing as tolerated.  She is minimal assist for  ambulation with limited endurance.  She was  seen by Inpatient Rehab  Services, felt to be a good candidate for comprehensive rehab program.   PAST MEDICAL HISTORY:  See discharge diagnoses.  Remote smoker.  No  alcohol.   ALLERGIES:  SULFA, TEQUIN, FLONASE, SPIRIVA, THEOPHYLLINE, TRICOR, and  ZETIA.   SOCIAL HISTORY:  Married.  She had been at [facility] on June 1  after left ankle fracture.  Plans to go home with her husband, daughter,  son-in-law, who can assist for the next few months, has a son next door  that works, 1-level home, 1-step to entry.   FUNCTIONAL HISTORY:  Independent prior to ankle fracture.   FUNCTIONAL STATUS UPON ADMISSION TO REHAB SERVICES:  Supervision bed  mobility and transfers, minimal assist ambulation, 12 feet, supervision  upper body activities of daily living, minimal assist lower body.   MEDICATIONS PRIOR TO ADMISSION:  1. 70/30 insulin 35 units a.m. and 15 units p.m.  2. Albuterol nebulizer treatments as needed.  3. 2 L oxygen.  4. Pulmicort 0.25 mg twice daily.  5. Iron twice daily.  6. Protonix 40 mg twice daily.  7. Synthroid 88 mcg daily.  8. Benicar 5 mg 2 tablets daily.  9. Actos 30 mg daily.  10.Prednisone 10  mg daily.  11.Zoloft 25 mg daily.  12.Lasix 60 mg daily.  13.Aspirin 81 mg daily.  14.Calcium daily.  15.Magnesium oxide 400 mg daily.   PHYSICAL EXAMINATION:  VITAL SIGNS:  Blood pressure 150/73, pulse 74,  temperature 97.8, and respirations 17.  NEUROLOGIC:  This is an alert female, in no acute distress, oriented x3.  Deep tendon reflexes 2+.  Calves remain cool without swelling, erythema,  nontender.  Sensation decreased to light touch distally.  She is moving  all extremities.  LUNGS:  Decreased breath sounds at bases.  CARDIAC:  Regular rate and rhythm.  ABDOMEN:  Soft and nontender.  Good bowel sounds.   REHABILITATION HOSPITAL COURSE:  The patient was admitted to Inpatient  Rehab Services with therapies initiated on a 3-hour daily basis  consisting of  physical therapy, occupational therapy, and rehabilitation  nursing.  The following issues were addressed during the patient's  rehabilitation stay.  Pertaining to Mr. Forton' deconditioning chronic  obstructive pulmonary disease, her strength endurance had greatly  improved.  She was slowly weaned from her prednisone.  Her standing dose  prior to admission was 10 mg daily.  She would remain with her albuterol  treatments as prior to hospital admission as well as chronic oxygen.  Recent left ankle fracture with open reduction and internal fixation on  June 1.  She is weightbearing as tolerated, per Dr. Rennis Chris.  Neurovascular sensation intact.  She remained on subcutaneous Lovenox  for deep vein thrombosis prophylaxis.  Pain management with oxycodone  immediate release as needed.  Blood pressures well controlled on Lasix  and Benicar with no orthostatic changes.  She did have a history of  insulin-dependent diabetes mellitus.  Her 70/30 insulin was resumed.  Blood sugars well controlled.  She is presently on 10 units in the  morning and 5 units every evening of her 70/30 insulin.  She will  continue her Synthroid for hypothyroidism.  She did have a history of  anxiety with depression maintained on Zoloft.  She was voiding without  difficulty.  The patient received weekly collaborative interdisciplinary  team conferences to discuss estimated length of stay, family teaching,  and any barriers to discharge.  She was now independent in her room,  independent activities daily living, upper body for self-care with  simple set up, minimal assist lower body.  She was independent to propel  her wheelchair.  Strength and endurance have greatly improved.  She was  encouraged overall progress and ultimately discharged to home with home  health therapies as advised.   DISCHARGE MEDICATIONS AT THE TIME OF DICTATION:  1. Prednisone 10 mg daily and taper as directed.  2. Aspirin 81 mg daily.  3.  Pulmicort 0.25 mg twice daily.  4. Colace 100 mg twice daily.  5. Multivitamin daily.  6. Benicar 10 mg daily.  7. Protonix 40 mg twice daily.  8. Zoloft 25 mg daily.  9. Lasix 60 mg daily.  10.Calcium 1 tablet daily.  11.Nu-Iron 150 mg twice daily.  12.Magnesium oxide 400 mg daily.  13.Albuterol nebulizer treatments as prior to hospital admission.  14.Mucinex 60 mg twice daily.  15.Synthroid 88 mcg daily.  16.Insulin 70/30 10 units a.m. and 5 units p.m.  17.Oxycodone immediate release 5 mg 1 or 2 tablets every 4 hours as      needed for pain, dispense of 60 tablets.   DIET:  Diabetic diet.   SPECIAL INSTRUCTIONS:  Continue oxygen as prior to hospital admission.  The patient  should follow up with Dr. Johnella Moloney, Medical Management;  Dr. Rennis Chris, Orthopedic Services as needed after recent left ankle  fracture; Dr. Riley Kill, Outpatient Rehab Services only as needed; and Dr.  Patty Sermons, Cardiology Services, call for appointment.      Mariam Dollar, P.A.      Ranelle Oyster, M.D.  Electronically Signed    DA/MEDQ  D:  12/08/2008  T:  12/08/2008  Job:  [id number]   cc:   Candyce Churn, M.D.  Vania Rea. Supple, M.D.  Cassell Clement, M.D.

## 2010-09-26 NOTE — Discharge Summary (Signed)
[name], [name]             ACCOUNT NO.:  [id number]   MEDICAL RECORD NO.:  [id number]          PATIENT TYPE:  INP   LOCATION:  1527                         FACILITY:  [facility]   PHYSICIAN:  Vania Rea. Supple, M.D.  DATE OF BIRTH:  1923/09/20   DATE OF ADMISSION:  10/12/2008  DATE OF DISCHARGE:  10/14/2008                               DISCHARGE SUMMARY   ADMISSION DIAGNOSES:  1. Left trimalleolar ankle fracture/dislocation.  2. Chronic obstructive pulmonary disease, home oxygen dependent.  3. Hypertension.  4. Diabetes.  5. History of hypothyroidism.  6. Reflux.  7. Peripheral vascular disease.  8. Anxiety/depression.  9. Mild hyperkalemia.   DISCHARGE DIAGNOSES:  1. Left trimalleolar ankle fracture/dislocation.  2. Chronic obstructive pulmonary disease, home oxygen dependent.  3. Hypertension.  4. Diabetes.  5. History of hypothyroidism.  6. Reflux.  7. Peripheral vascular disease.  8. Anxiety/depression.  9. Mild hyperkalemia.  10.Status post open reduction and internal fixation of left ankle.   OPERATION:  Open reduction and internal fixation of left bimalleolar  ankle fracture/dislocation.   SURGEON:  Vania Rea. Supple, M.D.   Threasa HeadsFrench Ana A. Shuford, P.A.-C.   ANESTHESIA:  Spinal anesthetic.   BRIEF HISTORY:  [name] is an 77 year old female with significant  medical history as above who had a fall in the home.  Significant pain  and deformity of the left lower extremity.  She was brought to [facility] where she was found to have a displaced left trimalleolar ankle  fracture/dislocation.  Risks, benefits and operative intervention were  indicated.  Due to her significant COPD, however, spinal anesthetic was  recommended.  She had also just recently eaten a large meal, so 8 hours  were allowed to progress before initiating surgery.   HOSPITAL COURSE:  The patient was admitted and underwent the above named  procedure and tolerated this well.  We did  call Medicine to see her  preoperatively due to her significant medical comorbidities.  [facility] saw and followed along with [place].  The  patient did extremely well with surgery.  Medicine did see and adjust  medications while inpatient.  Due to her significant medical history as  well as her need for being nonweightbearing to the left lower extremity  for a total of 6-8 weeks, a skilled bed was felt appropriate.  This is  being dictated in anticipation for discharge tomorrow to a skilled care  facility.  On today's date, the patient was alert and oriented.  She is  afebrile.  Vital signs were stable.  Her postoperative labs showed to be  within normal limits other than a mild hyperkalemia which is her  baseline.  At this time, she was felt medically and orthopedically  stable to discharge to a skilled care facility tomorrow.   LABORATORY DATA:  Preoperatively showed a BMET of mild hyperkalemia at  5.2, mild elevated BUN at 26.  Normal pro-time and INR.   DIAGNOSTICS:  The EKG showed normal sinus rhythm with left axis  deviation and right branch block.   CONDITION ON DISCHARGE:  Stable  and improved.   DISCHARGE INSTRUCTIONS:  The patient is being discharged to skilled care  facility.  In regards to orthopedic orders, she needs to be strict  nonweightbearing to the left lower extremity.  While she was inpatient,  she was provided Lovenox for DVT and PE prophylaxis.  This may be  transitioned, however, to an aspirin daily while in the facility.  Again, PT, OT daily.  Strict nonweightbearing.  They should call to  arrange transportation in 2 weeks at [phone] for follow up appointment  with Dr. Rennis Chris for follow up x-rays and wound check.  She should keep  splint clean and dry.  Otherwise, she may be on regular diet.   CURRENT MEDICATIONS:  1. Colace 100 mg b.i.d.  2. Benicar 10 mg daily.  3. Zoloft 25 mg daily.  4. Multivitamin daily.  5. Calcium  carbonate, Os-Cal daily.  6. Guaifenesin dextromethorphan b.i.d.  7. Protonix 80 mg daily.  8. Actos 30 mg daily.  9. Lasix 60 mg daily.  10.Synthroid 88 mcg daily.  11.Prednisone 10 mg daily.  12.Albuterol 2.5 inhaled nebulizer q.i.d.  13.Pulmicort 0.25 mg b.i.d.  14.Citrucel b.i.d.  15.She is on Lantus insulin 25 units FS q.h.s.  16.She was on NovoLog sliding scale which may be continued at the      facility.  17.Percocet 1-2 every 4-6 h. p.r.n. pain.  18.Enema of choice.  19.Robaxin 500 mg q.6 h. p.r.n.  20.Other medicines to be adjusted by the local physician.   Again prior to dictation tomorrow, anticipation for discharge.   CONDITION ON DISCHARGE:  Stable and improved.      Tracy A. Shuford, P.A.-C.      Vania Rea. Supple, M.D.  Electronically Signed    TAS/MEDQ  D:  10/13/2008  T:  10/13/2008  Job:  [id number]

## 2010-09-26 NOTE — Consult Note (Signed)
VASCULAR SURGERY CONSULTATION   EMRY, TOBIN  DOB:  1923/11/25                                       05/01/2007  UEAVW#:[id number]   PRIMARY CARE PHYSICIAN:  Candyce Churn, M.D.   REASON FOR CONSULTATION:  Extracranial cerebrovascular occlusive disease  (ECCVOD).   Patient is an 77 year old female patient of primary care physician Dr.  Johnella Moloney, referred for evaluation of diffuse extracranial  cerebrovascular occlusive disease.  She describes a history dating back  to approximately early October of this year with recurrent episodes of  near-blackout events.  The patient reports a feeling of dizziness,  tremulousness in the arms, and the feeling that her legs are about to  collapse.  She has a feeling of near loss of consciousness with the  sensation that she can hear but cannot respond.  Denies dimming or loss  of vision but does describe blurred vision.  These episodes last for  approximately 2-3 minutes and then clear.  She notes these episodes  particularly after standing up and has never had an episode while  sitting or lying down.   No monocular blindness, focal, sensory, or motor deficit.   No palpitations, chest pain, or chest pressure.   A CT scan performed in the emergency department at [facility] reveals a  mild-to-moderate degree of small vessel ischemic changes, but no acute  stroke was evident.   There is apparently one episode that was associated with some trouble  with speech.  This was short and resolved spontaneously.   PAST MEDICAL HISTORY:  1. Hypertension.  2. Dyslipidemia.  3. Type 2 diabetes.  4. Depression and anxiety.  5. COPD.   PAST SURGICAL HISTORY:  Cholecystectomy.   MEDICATIONS:  1. Humalog sliding scale.  2. NovoLog sliding scale.  3. Zoloft 25 mg daily.  4. Lasix 40 mg daily.  5. Benicar 20 mg daily.  6. Fish oil 1200 mg daily.  7. Magnesium 500 mg daily.  8. Potassium.  9. DuoNeb 3-4 tablets  daily.  10.Actos 45 mg daily.  11.Synthroid 0.1 mg daily.  12.Protonix 40 mg daily.  13.Multivitamin 1 tablet daily.  14.Calcium plus D 1200 mg daily.  15.Vitamin B12 500 mg daily.  16.Vitamin C 500 mg daily.  17.Aspirin 81 mg daily.  18.Vitamin D 2000 mg daily.   ALLERGIES:  SULFA, PENICILLIN, SPIRIVA, and STATINS.   FAMILY HISTORY:  Mother died at age 79 of the complications of diabetes  and a stroke.  Father died at age 4 of lung CA.  One brother died of  congenital heart disease.   SOCIAL HISTORY:  Patient is married with two children.  She is retired.  She does not smoke.  Discontinued tobacco use approximately six years,  having smoked for 60 years.  Does not consume alcohol.   REVIEW OF SYSTEMS:  This is reviewed on the patient encounter form  today.  Negative findings include general complaints, GU, orthopedic,  skin, or bleeding problems.  Patient does describe shortness of breath  when lying flat or with exertion.  She uses home oxygen and has a  history of COPD, bronchitis, and wheezing.  She does have some GI reflux  and chronic constipation.  Notes depression and anxiety.  She is hard of  hearing and has had laser surgery for retinal problems.   PHYSICAL EXAMINATION:  General:  An 77 year old female, alert and  oriented.  No acute distress.  Vital signs:  BP is 139/69.  Pulse is 89  and regular.  Respirations are 24.  O2 saturation is 97% on room air.  Temperature 97.4.  HEENT:  Mouth and throat are clear.  Normocephalic.  Extraocular movements are intact.  Mouth and throat are clear.  No  scleral icterus.  Neck:  Supple.  No thyromegaly or adenopathy.  Nontender.  Chest:  Scattered expiratory wheezes are present.  Air entry  equal bilaterally.  No rales.  Distant breath sounds.  Cardiovascular:  Bilateral carotid bruits.  Normal heart sounds without murmurs.  Regular  rate and rhythm.  Abdomen:  Soft.  Nontender.  No masses or  organomegaly.  Normal bowel sounds  without bruits.  Extremities:  No  joint deformity.  No ankle swelling.  Femoral pulses are 2+ bilaterally.  Neurologic:  Cranial nerves are intact.  No facial asymmetry.  Strength  is equal bilaterally at 5/5.  Normal motor tone.  Reflexes are 1+  bilaterally.  Skin:  Intact.  Warm and dry without ulceration or rash.   INVESTIGATIONS:  A four vessel cerebral arteriogram was carried out on  April 25, 2007.  This reveals bilateral vertebral artery origin  stenoses.  Dominant left vertebral artery.  An 80-99% left internal  carotid artery stenosis at the bulb, associated with calcified plaque.  A 50-60% right internal carotid artery stenosis at its origin with  calcified plaque.   Intracranial MRA reveals small caliber left internal carotid artery.  Decreased left ICA signal.  Dominant right vertebral flow.   MRI of the brain reveals mild-to-moderate small vessel disease with no  evidence of acute infarct, hemorrhage, or mass.   IMPRESSION:  1. Diffuse extracranial cerebrovascular occlusive disease with      generalized symptoms, no focal symptoms.  2. Hypertension.  3. Dyslipidemia.  4. Diabetes.  5. Depression.  6. Chronic obstructive pulmonary disease.   RECOMMENDATIONS:  Patient has diffuse extracranial cerebrovascular  occlusive disease with generalized neurologic symptoms.  No focal  symptoms.  A poor candidate for carotid stenting secondary to age and  degree of calcification of the severe left internal carotid artery  stenosis.  Despite her COPD, I do feel she is an adequate candidate to  undergo surgical management with left carotid endarterectomy.  The  relative risks of this are low with a 2-3% major morbidity mortality.   Following this, she can then be electively scheduled for potential  vertebral artery angioplasty and stenting.  Increase aspirin to 225 mg  daily and schedule elective left carotid endarterectomy.   Balinda Quails, M.D.  Electronically Signed   PGH/MEDQ  D:  05/05/2007  T:  05/06/2007  Job:  [id number]   cc:   Candyce Churn, M.D.  Marlan Palau, M.D.

## 2010-09-26 NOTE — Procedures (Signed)
CAROTID DUPLEX EXAM   INDICATION:  Followup carotid artery disease.   HISTORY:  Diabetes:  Yes, insulin-dependent.  Cardiac:  No.  Hypertension:  No.  Smoking:  No.  Previous Surgery:  Left carotid endarterectomy with Dacron patch  angioplasty on May 09, 2007, by Dr. Madilyn Fireman.  CV History:  Previous duplex on August 21, 2007, revealed 60-79% right ICA  stenosis and 20-39% left ICA stenosis status post endarterectomy.  Patient fell twice in the last week.  Amaurosis Fugax No, Paresthesias No, Hemiparesis No                                       RIGHT             LEFT  Brachial systolic pressure:         146               146  Brachial Doppler waveforms:         Triphasic         Triphasic  Vertebral direction of flow:        Antegrade         Antegrade  DUPLEX VELOCITIES (cm/sec)  CCA peak systolic                   79                222  ECA peak systolic                   212               225  ICA peak systolic                   296               213  ICA end diastolic                   47                38  PLAQUE MORPHOLOGY:                  Calcified         Soft  PLAQUE AMOUNT:                      Moderate          Moderate  PLAQUE LOCATION:                    Proximal ICA, proximal CCA          Distal ICA   IMPRESSION:  1. Right ICA stenosis 60-79%.  2. Left ICA stenosis 40-59% status post endarterectomy.  3. No significant change from previous study performed August 21, 2007.   ___________________________________________  P. Liliane Bade, M.D.   MC/MEDQ  D:  02/26/2008  T:  02/26/2008  Job:  [id number]

## 2010-09-26 NOTE — Consult Note (Signed)
[name], [name]             ACCOUNT NO.:  [id number]   MEDICAL RECORD NO.:  [id number]          PATIENT TYPE:  INP   LOCATION:  2920                         FACILITY:  [facility]   PHYSICIAN:  Leslye Peer, MD    DATE OF BIRTH:  Jan 15, 1924   DATE OF CONSULTATION:  11/24/2008  DATE OF DISCHARGE:                                 CONSULTATION   REQUESTING PHYSICIAN:  [facility].   REASON FOR CONSULT:  Acute respiratory failure and fever.   HISTORY OF PRESENT ILLNESS:  [name] is an 77 year old female with  a known history of O2 dependent COPD, who presented on November 24, 2008, to  the ER from a skilled nursing facility with a 2-3 day history of fever  as well as cough and congestion.  She began having cough and congestion  approximately 5-6 days ago, developing fever in the last 2-3 days and  became acutely more short of breath on the evening of November 23, 2008.  The skilled nursing facility in which she lives had performed a chest x-  ray as well as urinalysis and culture earlier in the week due to her  fever and both were unremarkable.  The patient was subsequently admitted  on November 24, 2008, by the Hospitalist Team and CCM was asked to evaluate  at this time for respiratory failure and SIRS.   PAST MEDICAL HISTORY:  1. Hypertension.  2. O2-dependent COPD.  3. Cerebrovascular disease.  4. Peripheral vascular disease, status post carotid endarterectomy.  5. GERD.  6. Anxiety.  7. Osteoporosis.  8. Diabetes.  9. Hypothyroid.  10.Vertigo.  11.Multiple falls.  12.Chronic anemia.   ALLERGIES:  The patient has multiple allergies including STATINS,  PENICILLIN, SULFA, AVELOX, ZETIA, TRICOR ZEGERID, THEOPHYLLINE, SPIRIVA,  PREMPRO, BUMETANIDE, and FLONASE.   MEDICATIONS:  1. Colace 100 mg p.o. b.i.d.  2. Benicar 10 mg p.o. daily.  3. Zoloft 25 mg p.o. daily.  4. Multivitamin p.o. daily.  5. Calcium carbonate 1 tab p.o. daily.  6. Guaifenesin unknown dose b.i.d.  7. Protonix 80 mg p.o. daily.  8. Actos 30 mg p.o. daily.  9. Lasix 60 mg p.o. daily.  10.Synthroid 88 mcg p.o. daily.  11.Prednisone 10 mg daily.  12.Albuterol nebs q.i.d.  13.Pulmicort 0.25 mg b.i.d.  14.Citrucel b.i.d.  15.Lantus insulin 25 units nightly.  16.NovoLog sliding scale insulin.   SOCIAL HISTORY:  The patient is married.  She lives in [place] in a  skilled nursing facility.  She is a former smoker with a 50-pack-year  smoking history and quit in 2003.  The patient denies alcohol or illicit  drug use.   FAMILY HISTORY:  Unknown other than a brother with emphysema.   REVIEW OF SYSTEMS:  The patient does complain of some fevers and chills.  She also complains of shortness of breath as well as dry cough.  All  other systems were reviewed and were negative.   PHYSICAL EXAMINATION:  VITAL SIGNS:  T-max 103.5, blood pressure 123/60,  heart rate 103, respiratory rate 22, and O2 sat 100% on 40% BiPAP.  GENERAL:  This is an  elderly female in mild distress at this time.  NEURO:  She is alert and oriented.  Cranial nerves grossly intact.  Speech is clear.  Moves all extremities.  HEENT:  Pupils are equal, round, and reactive to light.  Sclerae are  clear.  Mucous membranes are moist.  Dentition is good.  BiPAP mask in  place.  NECK:  Supple without lymphadenopathy.  No thyromegaly.  No JVD.  CARDIOVASCULAR:  S1 and S2.  Regular rate and rhythm.  No murmurs, rubs,  or gallops.  PULMONARY:  Breaths are even and mildly labored.  She has slight  accessory muscle usage.  LUNGS:  Lung sounds are decreased throughout, however, the patient is  able to complete a full sentence without stopping for breath.  GI:  Abdomen is soft, nontender, and nondistended.  Positive bowel  sounds.  SKIN:  Warm and dry without rashes or lesions and no peripheral edema.   RADIOLOGY DATA:  Chest x-ray shows bibasilar atelectasis and  questionable vascular congestion.   LABORATORY DATA:  Complete  metabolic panel:  Sodium 142, potassium 3.8,  glucose 163, BUN 17, and creatinine 0.86.  CBC:  White blood cells 11.6,  hemoglobin 11.4, hematocrit 32.9, and platelets 243.  Cardiac panel  shows a myoglobin of 229, troponin of 0.53, CK-MB is 4.5.  Urinalysis is  negative.  Arterial blood gas was drawn on BiPAP 12/6 on 40% pH 7.44,  pCO2 of 50, pO2 of 178, and bicarb 33.9.   Micro data at time of dictation, urine culture as well as blood cultures  x2 and sputum cultures are pending.   IMPRESSION AND PLAN:  1. Acute respiratory failure, at this time remains unknown etiology,      questionable pulmonary source of infection given fever and      leukocytosis.  However, cannot rule out a pulmonary embolism at      this time.  The patient is somewhat improved on BiPAP.  She has no      obvious infiltrate on chest x-ray and does have an underlying      chronic obstructive pulmonary disease.  Recommend continue BiPAP.      We will follow up chest x-ray and ABG.  I would recommend a 2-D      echo as well as CT angio to rule out a pulmonary embolism.      Bronchodilators will be given as needed.  Primary Team has already      started the patient on Solu-Medrol which is appropriate given her      chronic prednisone.  2. Systemic inflammatory response syndrome.  This is also of unclear      etiology, possible pulmonary source of infection.  The patient has      been pancultured and culture data is pending at this time.  The      patient is then started on vancomycin and Zosyn per Primary Team,      which appears appropriate given that she lives in a skilled nursing      facility.  Recommend we check a lactate, again wait for culture      data to return to need antibiotics.  Followup chest x-ray and again      we will check lactic acid.  3. Increased troponins.  The patient did have a troponin of 0.53.      Plan is to recycle enzymes as it could be related to cardiac stress      in the setting of  respiratory distress.  Again, Primary Team has      ordered to recycle enzymes and if they remain elevated Primary Team      will consult Cardiology.      Dirk Dress, NP      Leslye Peer, MD  Electronically Signed    KW/MEDQ  D:  11/24/2008  T:  11/25/2008  Job:  [id number]   cc:   Charlaine Dalton. Sherene Sires, MD, [facility]  Candyce Churn, M.D.

## 2010-09-26 NOTE — Assessment & Plan Note (Signed)
OFFICE VISIT   [name], [name]  DOB:  08-27-1923                                       05/20/2008  FAOZH#:[id number]   The patient returned to the office today after undergoing MRI of the  brain and MRA.  This reveals evidence of a likely remote cerebellar  stroke.  The MRA of the brain reveals no significant intracranial  atherosclerotic narrowing.  She does have dominant vertebral flow via  the right vertebral artery.   We discussed these results.  She is now on aspirin 81 mg daily.  I think  that continued conservative treatment is warranted at present due to her  recent asymptomatic state.  I will plan to see her again in 6 months  with followup Dopplers.   Balinda Quails, M.D.  Electronically Signed   PGH/MEDQ  D:  05/20/2008  T:  05/21/2008  Job:  [id number]   cc:   Candyce Churn, M.D.  Jake Bathe, MD

## 2010-09-26 NOTE — Consult Note (Signed)
[name], [name]             ACCOUNT NO.:  [id number]   MEDICAL RECORD NO.:  [id number]          PATIENT TYPE:  INP   LOCATION:  0106                         FACILITY:  [facility]   PHYSICIAN:  Hollice Espy, M.D.DATE OF BIRTH:  01-03-1924   DATE OF CONSULTATION:  DATE OF DISCHARGE:                                 CONSULTATION   REQUESTING PHYSICIAN:  Dr. Rennis Chris of orthopedics.   REASON FOR CONSULTATION:  Medical management of diabetes type and COPD.   HISTORY OF PRESENT ILLNESS:  [name] is an 77 year old white female  who presents to the [facility] emergency room following a fall.  She  apparently slipped accidentally and suffered a left ankle fracture.  We  were asked to evaluate the patient for recommendations in regards to her  diabetes and COPD management.   ALLERGIES:  1. THEOPHYLLINE.  2. PREMPRO.  3. FLONASE.  4. BUDESONIDE.  5. ZETIA CAUSES MUSCLE CRAMPS.  6. TRICOR CAUSES CRAMPS.  7. ZEGERID CAUSES BLURRED VISION.  8. STATIN INTOLERANCE.  9. AVELOX CAUSES __________.  10.SULFA.  11.PENICILLIN.  12.PLAVIX.  13.SPIRIVA HANDIHALER.   PAST MEDICAL HISTORY:  1. COPD.  2. Hypertension.  3. Diabetes type 2.  4. Iron deficiency anemia.  5. Hypothyroid.  6. GERD.  7. Anxiety.  8. Vertigo.  9. Concussion in March 2010.  10.Cerebrovascular disease.  11.Peripheral vascular disease.   PAST SURGICAL HISTORY:  1. Cholecystectomy.  2. Carotid endarterectomy.   SOCIAL HISTORY:  Lives with husband.  Former smoker, quit 9 years ago.  Denies alcohol use.   FAMILY HISTORY:  Noncontributory.   MEDICATIONS:  1. NovoLog sliding scale coverage, Humalog per sliding scale b.i.d.      The patient reports she usually takes 36-38 units in the morning      and 17 units in the evening, as well as 8-10 units of NovoLog      before lunch.  2. Benicar 10 mg p.o. daily.  3. Sertraline 25 mg p.o. daily.  4. Centrum Silver p.o. daily.  5. Calcium 600/400 mg 1 tab  p.o. daily.  6. Vitamin B, dose unclear at this time.  7. Mucinex DM 1 tab p.o. b.i.d.  8. Potassium gluconate 595 p.o. daily.  9. Omeprazole 20 mg 2 tablets p.o. q.a.c. breakfast.  10.Actos 30 mg p.o. daily.  11.Lasix 60 mg p.o. daily.  12.Levothyroxine 88 mcg p.o. daily.  13.Prednisone 10 mg p.o. daily.  14.Albuterol/ipratropium nebs 4 times daily.  15.Pulmicort nebulizers 0.25 mg p.o. b.i.d.  16.Magnesium 500 mg p.o. daily.  17.Citrucel 1 teaspoon p.o. b.i.d.  18.Oxygen 2 liters nasal cannula at night and as needed during the      day.   REVIEW OF SYSTEMS:  GENERAL:  She denies fever.  ENT:  Denies nasal congestion.  CARDIOVASCULAR:  Denies chest pain or palpitations.  RESPIRATORY:  She noted green sputum last week and was apparently being  treated for acute COPD exacerbation and is currently down to 10 mg of  prednisone daily.  Denies cough.  GI:  She noted chronic abdominal pain which primary MD contributes to  gas.  She denies nausea or vomiting.  GU:  Denies dysuria, denies hematuria.  PSYCHIATRIC:  History of depression which is well controlled on  sertraline.  NEURO:  Denies focal weakness.  SKIN:  Denies rashes.  MUSCULOSKELETAL:  Chronic arthritis on the right index finger.   LABS AND RADIOLOGY:  Sodium 136, potassium 5.2, chloride 96, bicarb 34,  BUN 26, creatinine 1.07, glucose 157.  Hemoglobin 11.1, hematocrit 32.9,  white blood cell count 10.3, platelets 296, INR 1.0.  X-ray of the left  ankle notes a trimalleolar fracture.  Chest x-ray stable borderline  cardiomegaly with bibasilar __________/atelectasis.   PHYSICAL EXAMINATION:  VITAL SIGNS:  Blood pressure 121/53, heart rate  80, respiratory rate 22, temperature 97.3.  O2 saturation 99% on 2  liters.  GENERAL:  The patient is an elderly white female in no acute distress.  NECK:  She is noted to have scarring from bilateral endarterectomies.  ENT:  Moist oral mucosa is noted.  CARDIOVASCULAR:  S1-S2.   Regular rate and rhythm.  No lower extremity  edema is noted.  RESPIRATORY:  Breath sounds.  Clear to auscultation bilaterally without  wheezes, rales or rhonchi.  No increased work of breathing.  ABDOMEN:  Soft, nontender, nondistended.  Protuberant.  Positive bowel  sounds are noted.  PSYCHIATRIC:  She is A and O x3, and pleasant.  EXTREMITIES:  She is noted to have the left lower extremity in a boot  with an ice pack.  No significant swelling of the toes noted.   ASSESSMENT/PLAN:  1. Left ankle fracture.  The patient is for surgical repair tonight      per Dr. Dub Mikes team.  Urinalysis culture and sensitivity pending.      We will also check EKG preoperatively.  2. Diabetes type 2.  She is using NovoLog sliding scale at home and      Humalog sliding scale.  She notes 36-38 units in the morning and 17      units in the evening yesterday, however, is unable to further      decipher her sliding scale at this time.  At this time, we will      discontinue Humalog preoperatively and give her a decreased dose of      Lantus.  We will also continue her sliding scale coverage while      inpatient and check a hemoglobin A1c.  3. Chronic obstructive pulmonary disease.  She is home O2 dependent.      Plan to continue home oxygen and DuoNebs.  4. Hypertension.  Her blood pressure is stable.  5. History of hypothyroid.  Continue home Synthroid, check TSH.  6. Gastroesophageal reflux disease, stable.  Continue home omeprazole.  7. History of peripheral vascular disease, currently stable throughout      patient follow up.  8. Anxiety/depression, stable.  Continue sertraline.  9. Mild hyperkalemia.  Plan to hold potassium supplement, as well as      magnesium supplement at this time.  Will repeat BMET and magnesium      in the morning.      Sandford Craze, NP      Hollice Espy, M.D.  Electronically Signed    MO/MEDQ  D:  10/12/2008  T:  10/12/2008  Job:  [id number]

## 2010-09-26 NOTE — H&P (Signed)
[name], [name]             ACCOUNT NO.:  [id number]   MEDICAL RECORD NO.:  [id number]          PATIENT TYPE:  INP   LOCATION:  1422                         [facility]ACILITY[facility]  [facility]   PHYSICIAN:  Michiel Cowboy, MDDATE OF BIRTH:  Sep 20, 1923   DATE OF ADMISSION:  07/28/2008  DATE OF DISCHARGE:                              HISTORY & PHYSICAL   PRIMARY CARE PHYSICIAN:  Candyce Churn, M.D.   CHIEF COMPLAINT:  Shortness of breath.   HISTORY OF PRESENT ILLNESS:  The patient is an 77 year old female with  history of COPD, persistent vertigo, hypertension, peripheral  vascular  disease, anxiety, as well as diabetes.  The patient reports that over  the past weeks or so, she had been having progressive worsening  shortness of breath, although, when pressed exactly, she is unable to  tell me in which way they are actually getting worse, perhaps some  worsening with activity.  She was in touch with her primary care  [name] throughout the week, trying to contact his office and let him  know that she had been having shortness of breath.  When she finally was  able to get hold of him, that was reportedly right after she had done  some strenuous activity.  She was extremely short of breath at the  moment which per her is actually fairly normal and has been like this  for quite some time for her.  While talking on the phone with her  physician she had very hard time catching her breath and was not able to  speak in complete sentences at which point she was asked to present to  the emergency department for further evaluation and possible admission.   At home, she so far has been doubling her prednisone from 10-20 and  continues her nebulizer treatments.  She says that if she does not use a  nebulizer she gets more short of breath.  She does have fairly advanced  COPD on chronic COPD at 2 liters followed by Dr. Sherene Sires.   She denies any fevers, no chills.  Positive for shortness of  breath but  no chest pain.  No exertional chest pain, shortness of breath can be at  rest but also with exertion.  Her blood sugars have been under good  control.  She has had problems with vertigo ever since the fall a couple  of weeks ago at [employer] when she had a concussion, and for this, she  takes meclizine as needed.   Per the patient, she has had a negative echogram recently, but also has  been dealing with some worsening lower extremity edema for which her  Lasix has been increased from 40-60.  Otherwise, review of systems  unremarkable.  No cough which is actually surprising to the patient.   PAST MEDICAL HISTORY:  1. Hypertension.  2. COPD on chronic 2 liters of oxygen.  3. History of cerebrovascular disease.  4. Peripheral vascular disease status post carotid endarterectomy.  5. GERD.  6. Anxiety.  7. Osteoporosis.  8. Type 2 diabetes.  9. Hypothyroidism.  10.Vertigo.  11.Recent fall with concussion.  12.Chronic anemia.   SOCIAL HISTORY:  The patient used to smoke but quit 80 years ago.  Does  not abuse alcohol.  Lives at home, has a very supportive family.   ALLERGIES:  SHE HAS INTOLERANCE TO STATINS, ALLERGIC TO PENICILLIN,  SULFA.  AVELOX APPARENTLY CAUSES GLOSSITIS, ZETIA CAUSES MUSCLE CRAMPS.  TRICOR CAUSES RIB CRAMPS, ZEGERID CAUSED BLURRED VISION.  ALSO ON HER  ALLERGY LIST IS THEOPHYL LIN, SPIRIVA, PREMPRO, FLONASE.  BUDESONIDE  CAUSES MOUTH SORES.   FAMILY HISTORY:  Noncontributory.   CURRENT MEDICATIONS:  1. Benicar 10 mg daily.  2. Sertraline 25 mg daily.  3. Centrum Silver.  4. Calcium.  5. Vitamin D.  6. Vitamin C.  7. Potassium 595 mg daily,  8. Omeprazole 40 mg daily.  9. Actos 30 mg daily.  10.NovoLog sliding scale.  11.Lasix 60 mg q.a.m.  12.Levothyroxine 88 mg daily.  13.__________.  14.Aspirin 81 mg daily.  15.Prednisone.  She is been recently taking 20 mg daily.  16.Albuterol/ipratropium neb four times a day.  17.Pulmicort twice a  day.  18.Magnesium 500 daily.  19 . Citrucel twice a day.  1. Valium as needed for dizziness 5 mg twice a day.  2. Antivert 12.5 mg twice a day, also for vertigo.  3. Two liters of oxygen.   PHYSICAL EXAMINATION:  VITAL SIGNS:  Temperature 97.5, blood pressure  164/56, pulse 85, respirations 18, satting 94% on 2 liters up to 100.  The patient currently appears to be in no acute distress beginning  complete sentences and states that she is feeling much better and was  back to her baseline.  GENERAL:  In the emergency department, she received Solu-Medrol 125 and  then received her regular breathing treatments.  HEENT:  Head nontraumatic.  Somewhat dry mucous membranes and somewhat  decreased skin turgor.  LUNGS:  No wheezes bilaterally.  Occasional crackles at the bases  bilaterally.  HEART:  Regular rate and rhythm.  Minor murmur noted.  ABDOMEN:  Slightly distended, but soft and nontender.  LOWER EXTREMITIES: Without clubbing, cyanosis or edema.  NEUROLOGICAL:  Strength 5/5, cranial nerves intact.  SKIN:  Clean, dry and intact.   LABORATORY DATA:  White cell count 8.0, hemoglobin 9.9.  Sodium 137,  potassium 4.2, creatinine 1.1.   Cardiac enzymes, first set negative.  BNP negative.  Chest x-ray showed  COPD, but no infiltrate.  EKG showing right bundle branch block,  bifascicular block.  Heart rate of 81, but no acute ischemic changes.  Of note, a 2-D echo, results sent to her, showed some mild diastolic  dysfunction but EF of 65-70% and mild aortic sclerosis.   ASSESSMENT/PLAN:  This is an 77 year old female, with likely progression  of her COPD versus mild COPD exacerbation.  1. COPD.  We will continue prednisone at 60.  Continue DuoNeb and      albuterol p.r.n. and Pulmicort.  For antibiotics, will do      doxycycline as the patient has multiple allergies.  Will write for      guaifenesin.  Continue home oxygen.  The patient is already      improving.  2. Shortness of  breath.  Likely COPD related.  For completion's sake,      will cycle cardiac markers because she continues to have abnormal      EKG, but her history is not necessarily consistent with unstable      angina.  Will need to, at some point, make sure that she had  a      stress test done.  Obtain D. dimer.  3. History of diastolic dysfunction.  I think at this point she is      somewhat fluid down.  Will hold Lasix for now and follow BNP.  4. GERD.  Will continue Omeprazole.  5. Hypertension.  Continue Benicar,  6. Diabetes.  Will do sliding scale and hold Actos.  7. Hypothyroidism.  Continue levothyroxine.  8. Anemia improved.  Will obtain anemia panel and hemoccult stools.      Currently, no indication for transfusion.  9. Vertigo.  Continue home medications.  10.Prophylaxis.  Protonix and Lovenox.   CODE STATUS:  The patient is currently unsure.  She says that she keeps  on changing her mind.  If a Kajsa Butrum, will make her full code, but she  will need to have this discussion with her primary care Kayzlee Wirtanen.      Michiel Cowboy, MD  Electronically Signed     AVD/MEDQ  D:  07/28/2008  T:  07/28/2008  Job:  [id number]   cc:   Candyce Churn, M.D.  Fax: [phone]

## 2010-09-26 NOTE — H&P (Signed)
[name], [name]             ACCOUNT NO.:  [id number]   MEDICAL RECORD NO.:  [id number]          PATIENT TYPE:  IPS   LOCATION:  4007                         FACILITY:  [facility]   PHYSICIAN:  Ranelle Oyster, M.D.DATE OF BIRTH:  February 20, 1924   DATE OF ADMISSION:  11/30/2008  DATE OF DISCHARGE:                              HISTORY & PHYSICAL   CHIEF COMPLAINT:  Deconditioning, left ankle pain.   PRIMARY CARE PHYSICIAN:  Candyce Churn, MD   ORTHOPEDICS:  Vania Rea. Supple, MD   CARDIOLOGIST:  Cassell Clement, MD   HISTORY OF PRESENT ILLNESS:  This is an 78 year old white female with  COPD on chronic O2 who suffered left ankle fracture and ORIF was  performed on October 12, 2008, by Dr. Rennis Chris.  She was discontinued to  [facility].  She was admitted back to [facility] on  November 24, 2008, with increased shortness of breath and fever of 103.  Chest x-ray showed pulmonary vascular congestion and BNP was 1187.  Blood cultures were negative.  Troponin was 1.83.  Echocardiogram  essentially was unremarkable.  The patient ruled out for MI.  The  patient was placed on IV vancomycin and Zosyn empirically for pneumonia  coverage.  She is placed on subcu Lovenox for DVT prophylaxis.  She  continues to remain on medications for her breathing problems with slow  taper of prednisone plan.  Her CAM boot was stopped on November 26, 2008,  and the patient is now weightbearing as tolerated.  I saw her rehab  consultation yesterday and felt that she could potentially benefit from  an inpatient admission with goals of returning home and the patient was  brought here today for that purpose.   REVIEW OF SYSTEMS:  Notable for shortness breath, cough, reflux,  weakness, anxiety, depression, and dizziness.  She is moving her bowels.  She has a Foley catheter in.  Full reviews in the written H and P.   PAST MEDICAL HISTORY:  Positive for:  1. COPD, on chronic O2.  2. Hypertension.  3. Iron deficiency anemia.  4. Hypothyroid.  5. GERD.  6. Anxiety/depression.  7. Vertigo.  8. TIA.  9. PVD.  10.Cholecystectomy.  11.Left CEA, 2008.   FAMILY HISTORY:  Positive for CAD and diabetes.   SOCIAL HISTORY:  Remote history of tobacco abuse.  She does not drink.  The patient is married and has been at [employer] for above.  Plan is to go  home with husband, daughter, and son-in-law who can assist at home.  They are preparing their house.  She has a one-level home with one step  to enter.   ALLERGIES:  1. SULFA.  2. TEQUIN.  3. FLONASE.  4. SPIRIVA.  5. THEOPHYLLINE.  6. TRICOR.  7. ZETIA.   HOME MEDICATIONS:  Insulin 70/30, albuterol neb, oxygen, Pulmicort,  iron, Protonix, Synthroid, Benicar, Actos, Zoloft, prednisone, Lasix,  aspirin, and calcium.   LABORATORY DATA:  Hemoglobin 10.7, white count 6.3, platelets 255.  Sodium 141, potassium 3.8, BUN 21, creatinine 0.8.  Most recent BNP was  1131 on November 26, 2008.  Chest x-ray on November 25, 2008, showed mild  peribronchial thickening and right basilar atelectasis.   PHYSICAL EXAMINATION:  VITAL SIGNS:  Blood pressure 150/73, pulse 74,  respiratory rate 17, and temperature 97.8.  GENERAL:  The patient is generally pleasant, alert, and oriented x3.  HEENT:  Pupils equal, round, and reactive to light.  Ear, nose, and  throat exam is generally unremarkable with borderline dentition.  NECK:  Supple without JVD or lymphadenopathy.  CHEST:  Notable for diffuse wheezes and decreased air movement overall.  No rhonchi or frank rales were appreciated.  HEART:  Regular rate and rhythm without murmurs, rubs, or gallops.  EXTREMITIES:  No clubbing or cyanosis.  Trace edema.  ABDOMEN:  Soft and nontender.  Bowel sounds are positive.  SKIN:  Notable for left ankle wound, which has healed essentially.  No  other obvious breakdown was seen.  She has had a few bruises here and  there.  NEUROLOGIC:  Cranial nerves II-XII are intact.   Reflexes are 1+ and  sensation was within normal limits in all four extremities.  Strength is  4/5 in the upper extremities, proximal to 4+/5 distally.  Lower  extremity strength was 2-3/5, proximal to 4/5 distally with some  weakness more in the left ankle than the right ankle.  Judgment,  orientation, memory, and mood were all within functional limits.  The  patient was hard of hearing.   POST ADMISSION PHYSICIAN EVALUATION:  1. Functional deficits secondary to deconditioning after pneumonia and      COPD exacerbation.  The patient with remote left ankle fracture,      which is now weightbearing as tolerated.  2. The patient is admitted to receive collaborative interdisciplinary      care between the physiatrist, rehab nursing staff, and therapy      team.  3. The patient's level of medical complexity and substantial therapy      needs in context of that medical necessity cannot be provided a      lesser intensity of care.  4. The patient has experienced substantial functional loss from her      baseline.  Upon functional assessment within last 24 hours, the      patient has been supervision bed mobility and transfers, min assist      gait 12 feet with significant shortness of breath, supervision      upper body ADLs, min assist lower body ADLs.  Judging by the      patient's diagnosis, physical exam, and functional history, she has      the functional potential for progress, which will result in      measurable gains while in inpatient rehab.  These gains will be      substantial and practical use upon discharge home in facilitating      mobility and self-care.  5. Physiatrist will provide 24-hour management of medical needs as      well as oversight of the therapy plan/treatment and provide      guidance as appropriate regarding interaction of the two.  Medical      problem list and plan are below.  6. The 24-hour rehab nursing will assist in management of the      patient's  bowel and bladder issues as well as pulmonary management,      skin, pain, and administration of medications, nutritional      concepts, etc.  7. PT will assess and treat for lower extremity strength, range of  motion, balance, endurance, functional mobility, and gait with      goals supervision to modified independent.  8. OT will assess and treat for upper extremity use and ADLs as well      as adaptive equipment and techniques, family and safety education      with goals supervision to modified independent.  9. Case management and social worker will assess and treat for      psychosocial issues and discharge planning.  10.Team conferences will be held weekly to assess progress towards      goals and to determine barriers at discharge.  11.The patient has demonstrated sufficient medical stability and      exercise capacity to tolerate at least 3 hours of therapy per day      at least 5 days per week.  12.Estimated length of stay is approximately 7 days.  Prognosis is      good.   MEDICAL PROBLEM LIST AND PLAN:  1. Deep vein thrombosis prophylaxis, subcu Lovenox.  Observe for any      signs of bleeding or effects upon platelets.  2. Pain control:  The patient is doing fairly well with oxycodone IR      p.r.n.  We will add Robaxin p.r.n. for spasms as well.  3. Blood pressure control:  The patient's blood pressure is borderline      today.  She remains on Lasix and Benicar.  We will titrate      medications to desired blood pressure.  Pressure should improve      with taper of prednisone.  4. Diabetes:  Lantus 20 units nightly.  We will need to adjust Lantus      and diabetes regimen based on prednisone taper as well.  5. Hypothyroid:  Synthroid.  6. Iron deficiency anemia:  Iron supplement b.i.d.  We will track      serial H and H.  7. Anxiety/depression, Zoloft.  8. Severe chronic obstructive pulmonary disease:  Continue albuterol      nebs, oxygen, and prednisone taper.  The  patient has severe disease      at baseline.  Effects of vigorous exercise and therapy will need to      be monitored closely.      Ranelle Oyster, M.D.  Electronically Signed     Ranelle Oyster, M.D.  Electronically Signed    ZTS/MEDQ  D:  11/30/2008  T:  11/30/2008  Job:  [id number]   cc:   Candyce Churn, M.D.  Vania Rea. Supple, M.D.  Cassell Clement, M.D.

## 2010-09-26 NOTE — Assessment & Plan Note (Signed)
OFFICE VISIT   ARLITA, BUFFKIN  DOB:  [birth year]                                       03/11/2008  ZOXWR#:[id number]   The patient returned to the office today for continued discussion  regarding her extracranial cerebrovascular disease.  She has undergone  left carotid endarterectomy 05/09/2007 and had an initial improvement in  her symptoms.   She however recently has developed recurrent symptoms of presyncope and  has had 2 episodes associated with near falls.   Review of her arteriograms reveals severe bilateral vertebral artery  disease with a dominant right vertebral artery.   With her recurrence of symptoms I am more in favor now of an  intervention, her right vertebral artery with stenting of the ostial  stenosis.  I have discussed this with the patient and her family.  Quoted a risk of 2-3% of potential cerebrovascular complications.   They are going to discuss this further and make final decision at that  time.   Balinda Quails, M.D.  Electronically Signed   PGH/MEDQ  D:  03/11/2008  T:  03/12/2008  Job:  [id number]

## 2010-09-26 NOTE — Assessment & Plan Note (Signed)
OFFICE VISIT   [name], [name]  DOB:  [birth year]                                       05/22/2007  UEAVW#:[id number]   The patient underwent left carotid endarterectomy for severe stenosis  carried out on 05/09/2007 at [facility].  Uneventful  postoperative recovery.  Discharged home in good condition.  No major  complaints since that time.   She has noted a resolution of her symptoms of unsteadiness of gait and  shaking of hands following this surgery.  Also noted improvement in her  left eye vision.   She remains on aspirin daily.   Left neck incision healing unremarkably.  Cranial nerves are intact.  Strength equal bilaterally.  BP is 130/74, pulse is 103 per minute,  respirations 20 per minute.   The patient has done well following her surgery.  I will plan to follow  up with her again in 3 months.  She does have significant posterior  circulation problems and a decision will have to be made regarding  possible percutaneous intervention regarding these.   Balinda Quails, M.D.  Electronically Signed   PGH/MEDQ  D:  05/22/2007  T:  05/23/2007  Job:  [id number]   cc:   Candyce Churn, M.D.

## 2010-09-26 NOTE — Procedures (Signed)
CAROTID DUPLEX EXAM   INDICATION:  Followup carotid artery disease.   HISTORY:  Diabetes:  Yes, on insulin.  Cardiac:  No.  Hypertension:  No.  Smoking:  No.  Previous Surgery:  Left carotid endarterectomy with DPA 05/09/2007 by  Dr. Madilyn Fireman.  CV History:  Amaurosis Fugax No, Paresthesias No, Hemiparesis No                                       RIGHT             LEFT  Brachial systolic pressure:         140               140  Brachial Doppler waveforms:         Biphasic          Biphasic  Vertebral direction of flow:        Antegrade         Antegrade  DUPLEX VELOCITIES (cm/sec)  CCA peak systolic                   103               114  ECA peak systolic                   234               145  ICA peak systolic                   265               94  ICA end diastolic                   44                19  PLAQUE MORPHOLOGY:                  Calcified with shadowing            Calcified  PLAQUE AMOUNT:                      Large             Moderate  PLAQUE LOCATION:                    ICA, ECA          ICA, ECA   IMPRESSION:  1. Bilateral ECA stenoses right greater than left.  2. 60-79% right ICA stenosis.  However, calcified plaque with      shadowing could obscure a more severe stenosis.  3. 20-39% left ICA stenosis status post endarterectomy.   ___________________________________________  P. Liliane Bade, M.D.   DP/MEDQ  D:  08/21/2007  T:  08/21/2007  Job:  [id number]

## 2010-09-26 NOTE — Assessment & Plan Note (Signed)
OFFICE VISIT   MUSETTE, KISAMORE  DOB:  11-23-1923                                       02/26/2008  [id number]#:[id number]   The patient underwent a left carotid endarterectomy for severe stenosis  05/09/2007.  Uneventful postoperative recovery.   She had initially resolution of her symptoms of unsteadiness of gait,  presyncope and shaking of her hands.  However, she has now noticed a  recurrence of these symptoms.  She has had two presyncopal type  episodes.   She has diffuse extracranial cerebrovascular occlusive disease.  Has  clearly now had left carotid endarterectomy.  She does have significant  vertebral artery disease with severe bilateral vertebral artery  stenoses.  Dominant left vertebral artery.   May well require a left vertebral angioplasty and stenting for relief of  potential posterior circulation symptoms.   Balinda Quails, M.D.  Electronically Signed   PGH/MEDQ  D:  03/04/2008  T:  03/05/2008  Job:  [id number]

## 2010-09-26 NOTE — Assessment & Plan Note (Signed)
OFFICE VISIT   SANYAH, MOLNAR  DOB:  30-Jul-1923                                       05/06/2008  XBMWU#:[id number]   The patient returned to the office today with her daughter to discuss  further management of her cerebrovascular disease.  She has undergone a  left carotid endarterectomy for severe stenosis.   She has known significant vertebral artery disease with dominant left  vertebral artery and high-grade stenoses of the vertebral vessels  bilaterally.   She has had symptoms in the past of presyncope and unsteadiness of gait.  No recent symptoms.   We discussed further workup and management of this.  The key question is  whether she needs a left vertebral stent placed.   I have ordered an MRI of the brain and MRA of the brain to further  evaluate her posterior circulation and make an educated decision at that  time.   Balinda Quails, M.D.  Electronically Signed   PGH/MEDQ  D:  05/06/2008  T:  05/10/2008  Job:  [id number]   cc:   Candyce Churn, M.D.  Jake Bathe, MD

## 2010-09-26 NOTE — Discharge Summary (Signed)
[name], ZETTLER             ACCOUNT NO.:  [id number]   MEDICAL RECORD NO.:  [id number]          PATIENT TYPE:  INP   LOCATION:  1422                         FACILITY:  [facility]   PHYSICIAN:  Candyce Churn, M.D.DATE OF BIRTH:  [birth year]   DATE OF ADMISSION:  07/27/2008  DATE OF DISCHARGE:  07/29/2008                               DISCHARGE SUMMARY   DISCHARGE DIAGNOSES:  1. Chronic obstructive pulmonary disease with acute exacerbation with      hypoxemia, improved.  2. Iron deficiency anemia with workup ongoing  3. Type 2 diabetes mellitus.  4. Hypertension.  5. Hypothyroidism.  6. GERD.  7. Anxiety.  8. History of vertigo.  9. History of recent fall with concussion.  10.Osteoporosis.  11.Cerebrovascular disease.  12.Peripheral vascular disease status post carotid endarterectomy.   DISCHARGE MEDICATIONS:  1. Benicar 10 mg orally daily.  2. Sertraline 25 mg orally daily.  3. Centrum Silver 1 tablet orally daily.  4. Calcium and vitamin D 600/400 1 tablet daily.  5. Potassium gluconate 595 mg 1 tablet daily.  6. Omeprazole 20 mg daily.  7. Actos 30 mg daily.  8. NovoLog sliding scale for CBG 150 and below, zero units, 151-200      three units, 201-250 five units, 251-300 eight units, 301-350      eleven units, 351-400 fifteen units, greater than 400 sixteen units      and call M.D.  9. Lasix 60 mg orally daily.  10.Levothyroxine 75 mcg orally daily.  11.Prednisone taper starting with 40 mg a day for 3 days, then 30 mg a      day for 3 days, then 20 mg a day for 3 days, then 10 mg daily.  12.Albuterol/ ipratropium nebulizers 4 times daily 2.5/0.5.  13.Pulmicort nebulizers b.i.d. 0.25 mg.  14.Magnesium 500 mg daily.  15.Citrucel b.i.d. as per prior to admission.  16.Vicodin 5 mg b.i.d. p.r.n.  17.Oxygen 2 liters p.r.n. per nasal cannula.  18.Doxycycline 100 mg orally b.i.d. for 5 days.   PROCEDURES:  CT angiogram of the chest on July 28, 2008 revealed  moderate centrilobar emphysema with atherosclerotic disease of the  aorta, great vessels and coronary arteries.  There was also mild  superior endplate compression deformities of 3 continuous levels in the  upper to mid- thoracic spine, consistent with appearance on chest  radiograph June 27, 2008.  Cardiomegaly also noted.  No evidence for  pulmonary emboli.   DISCHARGE LABORATORY DATA:  Hemoglobin 11.3, white count 8800, platelet  count  261,000.  Iron levels prior to transfusion 23, low, iron binding  capacity 480, elevated.  Iron saturation 5%, low.  Vitamin B12 level  589, normal.  TSH mildly low at 0.216 and Synthroid will be reduced from  88 mcg a day to 75 mcg daily.  B-Met on day of discharge revealed a  sodium 140, potassium 4.3, chloride 97, bicarb 32, glucose 221, BUN 35,  creatinine 0.98, calcium 9.6.  Cardiac enzymes on admission revealed a  CK of 65, CK-MB 2.8 and troponins 0.04, all normal.  BNP on admission  was 67.8, normal.  Lipid profile  was also normal with cholesterol 204,  triglyceride 41, HDL 82 and LDL 114.   HOSPITAL COURSE:  Ms. Mylynn Dinh is an 77 year old female with  history of O2 dependent COPD, episodic and frequent vertigo,  hypertension, peripheral varices, anxiety and diabetes.  She had been  reporting progressive shortness of breath over the past couple weeks and  became acutely short of breath while trying to water some plants at her  home.  I was called and asked her to report to the [facility] Emergency  Room, where her shortness of breath had improved by the time of  evaluation but she was noted to be quite anemic with a hemoglobin of 8.8  and she had expiratory wheezes.  She is followed chronically by Dr.  Sandrea Hughs of [facility] for her COPD.  She tries to be very  careful about her medications and is very compliant with how she takes  them.  Chest x-ray on admission revealed linear atelectasis and/or  scarring in the bases  with underlying COPD.  Abdominal x-ray revealed a  non-obstructive bowel gas pattern.  The patient was admitted and treated  with doxycycline, oxygen, prednisone and budesonide and albuterol  nebulizer therapies and improved quickly in terms of her COPD.  She was  transfused with 2 units of packed red cells for hemoglobin 8.8 and her  strength and fatigue improved immensely with the transfusion.  She was  noted to be iron deficient and that will be  worked up as an outpatient  and plans are for an see Dr. Danise Edge for probable upper endoscopy  and perhaps colonoscopy to be set up in the next week or so.  She will  be discharged on a prednisone taper as above, to taper down to her usual  dose of 10 mg daily, chronically.  In terms of her hypothyroidism, she  is slightly over treated with a suppressed TSH and will decrease  Levothyroxine from  88 to 75 mcg daily.  She was to have eye surgery in  several days and that has been postponed.   CONDITION ON DISCHARGE:  Much improved.      Candyce Churn, M.D.  Electronically Signed     RNG/MEDQ  D:  07/29/2008  T:  07/29/2008  Job:  [id number]   cc:   Charlaine Dalton. Sherene Sires, MD, FCCP  [address]  [place] [address]   Danise Edge, M.D.  Fax: [phone]

## 2010-09-29 NOTE — Discharge Summary (Signed)
NAME:  [name], [name] NO.:  [id number]   MEDICAL RECORD NO.:  [id number]                   PATIENT TYPE:  INP   LOCATION:  0366                                 FACILITY:  [facility]   PHYSICIAN:  Candyce Churn, MD            DATE OF BIRTH:  [birth year]   DATE OF ADMISSION:  03/03/2002  DATE OF DISCHARGE:                                 DISCHARGE SUMMARY   DISCHARGE DIAGNOSES:  1. Upper abdominal pain, improved.  2. Biliary dyskinesia with decreased gallbladder ejection fraction at 17% on     biliary HIDA scanning on March 05, 2002.  3. Chronic obstructive pulmonary disease.  4. Hypertension, controlled.  5. Type 2 diabetes mellitus for 12 years, controlled.  6. Hypothyroidism.  7. Mild depression, controlled.   DISCHARGE MEDICATIONS:  1. Actos 45 mg daily.  2. Glyburide 5 mg b.i.d.  3. Synthroid 125 mcg daily.  4. Lasix 20 mg q.d.  5. Advair inhaler 100/50 one inhalation b.i.d.  6. Benicar 20 mg q.d.  7. Effexor 37.5 mg daily.  8. Protonix 40 mg p.o. b.i.d.  9. Vicodin extra-strength one to two p.o. q.4-6h. p.r.n. pain.  10.      Phenergan 25 mg p.o. q.6-8h. p.r.n. nausea.   CONSULTATIONS:  1. Dr. Susy Frizzle Martin--general surgery.  2. Dr. Rochele Pages.   PROCEDURE:  1. Abdominal ultrasound performed on March 03, 2002 date of admission     revealed fatty liver and cortical thinning of the kidneys.  Gallbladder     appeared normal and common bile duct was normal diameter at 2.5 mm.  No     gallstones or gallbladder wall thickening noted.  2. Chest x-ray March 03, 2002:  Borderline heart size and bibasilar     atelectasis left greater than right.  3. CT scan of the abdomen and pelvis March 04, 2002:  Atherosclerotic     changes are seen within the aorta, but no aneurysms noted.  Pelvis     revealed an atrophic uterus, negative adnexal masses, no pelvic     lymphadenopathy.  Considered to be essentially normal  examination.     Hepatobiliary scanning performed on March 05, 2002 revealed     visualization of the gallbladder at 25 minutes and visualization of the     biliary tree at 15 minutes.  Ejection fraction at 60 minutes after     stimulation with 1.4 mcg of CCK with only 19.2%, well below normal     values.   HOSPITAL COURSE:  The patient is a very pleasant 77 year old female who  presented to the emergency department with a four to five day history of  progressive worsening colicky epigastric abdominal pain.  The patient would  last about a minute in sudden duration the first few days but then this  became increasingly worse.  She was found to have some sweating with this  pain.  She was seen at  a medical facility at [facility] and was told it  was probably gas and was given ranitidine and she returned to [facility]  and presented to the [facility] Emergency Room complaining of worsening  pain.   Dr. Ike Bene noted her to not have any pain in the emergency room  but because of the progressive colicky nature of the epigastric pain that  radiated into her back, wanted to rule out an ulcer or aneurysm.  A  gallbladder work-up initially has been relatively normal except for the  decreased ejection fraction.   While admitted she essentially had normal laboratories except for a slightly  low hemoglobin which I think is anemia of chronic disease and I will have to  review records at the office.  She has had normal white count, normal  platelet count, and liver functions and BMET have been within normal limits.  Amylase and lipase were normal as well.   She continued to have episodic pain, usually after meals while hospitalized  which required Dilaudid for pain resolution.  She did report that her pain  sometimes may be worse when she is lying down.   With the work-up as above, the etiology of the pain is still elusive and we  will be consulting Dr. Danise Edge today to try  to get an upper endoscopy  performed.  If this is normal surgical consult may require further cardiac  work-up prior to elective cholecystectomy which is probably the most  impressive finding thus far as an etiology for her pain, though certainly  not entirely convincing.   Will await upper endoscopy results and likely discharge patient home today  to be followed up by surgery next week of EGD is negative.  May try to  schedule Adenosine Cardiolite testing prior to discharge as well.                                               Candyce Churn, MD    RNG/MEDQ  D:  03/06/2002  T:  03/06/2002  Job:  [id number]   cc:   Danise Edge, M.D.  [address]  [address]  [place]  [place] [address]  Fax: [phone]   [place]. Daphine Deutscher, M.D.  Fax: [phone]

## 2010-09-29 NOTE — Discharge Summary (Signed)
[name], [name]             ACCOUNT NO.:  [id number]   MEDICAL RECORD NO.:  [id number]          PATIENT TYPE:  INP   LOCATION:  6736                         FACILITY:  [facility]   PHYSICIAN:  Candyce Churn, M.D.DATE OF BIRTH:  05/04/1924   DATE OF ADMISSION:  11/01/2005  DATE OF DISCHARGE:  11/07/2005                                 DISCHARGE SUMMARY   DISCHARGE DIAGNOSES:  1. Right lower lobe pneumonia and pleural effusion - improved.  2. Chronic obstructive pulmonary disease.  3. Hypertension.  4. Type 2 diabetes mellitus.  5. Hypothyroidism.  6. Chronic anxiety.  7. Gastroesophageal reflux disease.   ALLERGIES:  INCLUDE ALL STATINS, PENICILLIN AND SULFA.   DISCHARGE MEDICATIONS:  1. NPH insulin 12-15 units subcu at bedtime depending on sliding scale.  2. Zoloft 25 mg p.o. daily.  3. Benicar 20 mg p.o. daily.  4. Actos 45 mg p.o. daily.  5. O2 2 liters per nasal cannula p.r.n. shortness of breath.  6. Glyburide 5 mg p.o. b.i.d.  7. Metformin 1 g p.o. b.i.d.  8. Protonix 40 mg every day.  9. Synthroid 125 mcg daily.  10.Avelox 400 mg orally daily for 5 days.  11.Albuterol and Atrovent nebs 4 x daily.   CONDITION ON DISCHARGE:  Improved.   HOSPITAL COURSE:  [name] is a very pleasant 77 year old female  with fairly severe COPD as well as hypertension, diabetes, hypothyroidism.   The say of admission, patient presented to [facility] with  more than 24 hours of right lung pain and shortness of breath.  She had a  low-grade fever with a temperature of 99.3 and a nonproductive cough.  Pulse  ox was 83% and chest x-ray revealed right lower lobe effusion as well as  possible infiltrate.  Thoracentesis was considered, but not performed.   The patient was admitted and hospital course was as follows:  Pulmonary - The patient was actually found to have a bilobar pneumonia on a  chest x-ray and had a persistent pleural effusion; however,  thoracentesis  was not performed secondary to it being a very small effusion.  The  patient's white count on admission was 13,500 and November 06, 2005, it was down  to 8500.  She was also hyponatremic on admission with a sodium of 127, and  this improved to 136 by the time of discharge on November 07, 2005.   She was treated with IV Rocephin and azithromycin and at discharge, she was  converted to Avalox 400 mg added for 5 more days as an outpatient.   The patient did require nasal cannula O2 while hospitalized and on room air  her O2 saturation was 86-89% at discharge, and she was discharged home on 2  liters of O2.   Diabetes - Her hemoglobin A1c was measured while admitted, and it was 8.2%.  Glucose while admitted though however, were quite well-controlled ranging  anywhere from 51 to 115.  She was hyperglycemic on admission at 272.   By discharge, patient was ambulating well, but still required nasal cannula  O2.   The patient was also found  to be anemic on admission with a hemoglobin of  9.1 and by November 05, 2005, hemoglobin dropped to 8.1.   Because of her severe COPD and pneumonia, it was felt that transfusion would  be reasonable, and she received 2 units of packed red cells on November 05, 2005, with a hemoglobin at discharge 11.3.   The cause of anemia felt likely to be secondary to diverticulosis and her  acute illness.  She had an upper endo and colonoscopy in 2006, which  revealed no neoplasms, only diverticulosis.   The patient was discharged in improved condition.  She is to follow up at  [facility] for an appointment on November 12, 2005.      Candyce Churn, M.D.  Electronically Signed     RNG/MEDQ  D:  12/29/2005  T:  12/29/2005  Job:  [id number]

## 2010-09-29 NOTE — Op Note (Signed)
NAME:  [name], [name]                       ACCOUNT NO.:  [id number]   MEDICAL RECORD NO.:  [id number]                   PATIENT TYPE:  OIB   LOCATION:  2870                                 FACILITY:  [facility]   PHYSICIAN:  [place] Park. Daphine Deutscher, M.D.             DATE OF BIRTH:  June 15, 1923   DATE OF PROCEDURE:  03/13/2002  DATE OF DISCHARGE:                                 OPERATIVE REPORT   PREOPERATIVE DIAGNOSES:  A 77 year old patient with COPD and biliary  dyskinesia.   POSTOPERATIVE DIAGNOSES:  A 77 year old patient with COPD and biliary  dyskinesia.   OPERATION PERFORMED:  Laparoscopic cholecystectomy with intraoperative  cholangiogram and repair of umbilical hernia.   SURGEON:  Thornton Park. Daphine Deutscher, M.D.   ASSISTANT:  Anselm Pancoast. Zachery Dakins, M.D.   ANESTHESIA:  General endotracheal.   INDICATIONS FOR PROCEDURE:  The patient is a 77 year old lady with COPD, who  has been previously admitted to [facility] with right upper quadrant  abdominal pain.  A HID scan with CCK stimulation indicated an ejection  fraction of only 15%.  Cardiac clearance was obtained and the patient was  brought in at this time for a laparoscopic cholecystectomy.   DESCRIPTION OF PROCEDURE:  The patient was taken to room 16 and given  general anesthesia.  The abdomen was prepped with Betadine and draped  sterilely.  A longitudinal incision down into obvious frank umbilical  hernia.  After doing that, I went ahead and freed up the fascia around this  ring and used a pursestring suture through it to hold the Hasson in place.  The Hasson was inserted and the abdomen was insufflated.  Three other  trocars were placed.  The gallbladder was grasped, elevated.  I took down  some adhesions in her right upper quadrant to the gallbladder.  A clip was  used to clip the vascular supply to the omental adhesions proximally and  then they were taken down sharply off the gallbladder.  The cystic duct was  dissected free  and a clip placed upon the gallbladder.  An incision was made  and Reddick catheter inserted.  A dynamic cholangiogram was obtained which  showed prompt filling of the intrahepatic duct and flow into the duodenum.  The cystic duct was then triple clipped and divided and then the gallbladder  was removed from the gallbladder using the hook electrocautery.  It was  detached, placed in a bag and brought out through the umbilicus.   In the meantime, I then repaired the umbilical defect using interrupted  simple sutures of 0 Prolene.  This approximated the hernia.  The closure was  inspected from the inside and looked good.  Port sites were withdrawn.  The  skin was closed with 4-0 Vicryl, benzoin and Steri-Strips.  The patient  seemed to tolerate the procedure well and was taken to the recovery room in  satisfactory condition.  Thorn[place] Park Daphine Deutscher, M.D.    MBM/MEDQ  D:  03/13/2002  T:  03/13/2002  Job:  [id number]   cc:   Candyce Churn, MD  [address]  [place] [address]  Fax: [phone]

## 2010-09-29 NOTE — H&P (Signed)
[name], [name]             ACCOUNT NO.:  [id number]   MEDICAL RECORD NO.:  [id number]          PATIENT TYPE:  INP   LOCATION:  NA                           FACILITY:  [facility]   PHYSICIAN:  Candyce Churn, M.D.DATE OF BIRTH:  08-May-1924   DATE OF ADMISSION:  DATE OF DISCHARGE:                                HISTORY & PHYSICAL   CHIEF COMPLAINT:  Right lung pain and shortness of breath.   HISTORY OF PRESENT ILLNESS:  Over 24 hours, the patient has had right lung  pain and shortness of breath.  She has had a low-grade fever and a  nonproductive cough as well.  She is not wheezing.  She was brought in for  further evaluation and found to have a right lower lobe pneumonia.  She also  is hypoxic (83% pulse ox).  The decision was to admit her.   REVIEW OF SYSTEMS:  Per CC and HPI.   PAST MEDICAL HISTORY:  1.  COPD.  2.  Hypertension.  3.  Diabetes mellitus.  4.  Hypothyroidism.   PAST SURGICAL HISTORY:  Gallbladder removed in 2004 by Dr. Wenda Low.   MEDICATIONS:  1.  Albuterol 2 puffs q.4h. p.r.n.  2.  Atrovent by nebulizer.  3.  Benicar 20 mg 1 daily.  4.  Actos 45 mg daily.  5.  Glyburide 5 mg b.i.d.  6.  NPH Insulin 19 units at night.  7.  Metformin 1 gm b.i.d.  It is on hold until Friday morning because of CT      scan.  8.  Protonix 40 mg every other day.  9.  Zoloft 25 mg.  10. Synthroid 125 mcg daily.   DRUG ALLERGIES:  ALL STATINS, PENICILLIN, SULFA.   SOCIAL HISTORY:  She quit smoking in 2003.  She never drank any alcohol.  She is retired from office work.  Her first husband died of a cerebral  aneurysm.  She is remarried.   FAMILY HISTORY:  Father died at age 96 of lung cancer.  Mother died of a CVA  in her 4s as well.  Two brothers, both are deceased, one from diabetes, the  other from cancer of the kidney and bladder.   PHYSICAL EXAMINATION:  VITAL SIGNS:  Blood pressure 128/60, temperature  99.3, pulse 76, pulse ox off O2 is 83%.  HEENT:   Normal with nasal mucosa.  Oral mucosa normal.  NECK:  Supple with no adenopathy, JVD, or bruits.  LUNGS:  Decreased breath sounds to the right lower lung.  HEART:  Regular rate and rhythm without murmurs, rubs, gallops, or clicks.  ABDOMEN:  Soft, nontender with positive bowel sounds throughout.  RECTAL:  No blood in the stool.  EXTREMITIES:  No clubbing, cyanosis or edema.  SKIN:  Clear.   LABS:  Chest x-ray suggests a right lower pleural effusion.  Pulse ox of  83%.   ASSESSMENT:  Right lower lobe with hypoxia.   Admit for IV antibiotics.  Consult with Dr. Robley Fries.      [name], N.P.      Candyce Churn, M.D.  Electronically Signed    MAP/MEDQ  D:  11/01/2005  T:  11/01/2005  Job:  [id number]

## 2010-09-29 NOTE — Op Note (Signed)
[name], [name]             ACCOUNT NO.:  [id number]   MEDICAL RECORD NO.:  [id number]          PATIENT TYPE:  AMB   LOCATION:  ENDO                         FACILITY:  [facility]   PHYSICIAN:  Danise Edge, M.D.   DATE OF BIRTH:  12/01/1923   DATE OF PROCEDURE:  DATE OF DISCHARGE:  12/27/2004                                 OPERATIVE REPORT   PROCEDURE:  Esophagogastroduodenoscopy and incomplete colonoscopy.   INDICATIONS:  [name] is a 77 year old female born January 22, 1924.  [name] is undergoing diagnostic esophagogastroduodenoscopy and  colonoscopy to evaluate the passage of melenic stool and hematochezia.  She  takes aspirin 81 mg daily.   On April 14, 2001, I was able to perform a proctocolonoscopy to the distal  ascending; due to colonic loop formation, I was unable to examine the  proximal ascending colon, cecum and ileocecal valve.   PREMEDICATION:  Versed 7.5 mg, Demerol 60 mg.   DESCRIPTION OF PROCEDURE:  Procedure #1:  Diagnostic  esophagogastroduodenoscopy.  After obtaining informed consent, [name]  was placed in the left lateral decubitus position.  I administered  intravenous Demerol and intravenous Versed to achieve conscious sedation for  the procedure.  The patient's blood pressure, oxygen saturation and cardiac  rhythm were monitored throughout the procedure and documented in the medical  record.   The Olympus gastroscope was passed through the posterior hypopharynx and the  proximal esophagus without difficulty.  The hypopharynx, larynx and vocal  cords appeared normal.   Esophagoscopy: The proximal, mid and lower segments of the esophageal mucosa  appeared normal.  Squamocolumnar junction and esophagogastric junction are  noted at 40 cm from the incisor teeth.   Gastroscopy: A retroflexed view of the gastric cardia and fundus was normal.  The gastric body, antrum and pylorus appear normal.   Duodenoscopy: The duodenal  bulb, mid duodenum and distal duodenum appeared  normal.   ASSESSMENT:  Normal esophagogastroduodenoscopy.   PROCEDURE #2:  Incomplete proctocolonoscopy to the mid transverse colon.   Anal inspection and digital rectal exam were normal.  The Olympus adjustable  pediatric colonoscope was introduced into the rectum and once advanced to  the mid transverse colon, further advancement was impossible due to colonic  loop formation which could not be overcome by repositioning the patient to  the supine position and applying external abdominal pressure.  Colonic  preparation for the exam today was satisfactory.   [name] has extensive left colonic diverticulosis without  diverticulitis.   Rectum normal.  Retroflexed view of the distal rectum reveals large  nonbleeding internal hemorrhoids.   Sigmoid colon and ascending colon. Colonic diverticulosis.   Splenic flexure normal.   Mid and distal transverse colon normal.   Proximal transverse colon, hepatic flexure, ascending colon, cecum and  ileocecal valve were not examined.   RECOMMENDATIONS:  I will schedule [name] for an air contrast barium  enema next week.           ______________________________  Danise Edge, M.D.     MJ/MEDQ  D:  12/27/2004  T:  12/27/2004  Job:  [id number]   cc:   Candyce Churn, M.D.  [address] [place]  [place] [address]  Fax: [phone]

## 2010-09-29 NOTE — Discharge Summary (Signed)
[name]             ACCOUNT NO.:  [id number]   MEDICAL RECORD NO.:  [id number]          PATIENT TYPE:  OUT   LOCATION:  XRAY                         FACILITY:  [facility]   PHYSICIAN:  Balinda Quails, M.D.    DATE OF BIRTH:  Mar 06, 1924   DATE OF ADMISSION:  05/10/2007  DATE OF DISCHARGE:  05/10/2007                               DISCHARGE SUMMARY   ADMISSION DIAGNOSIS:  Left carotid stenosis, symptomatic.   DISCHARGE DIAGNOSES:  Left carotid stenosis, symptomatic.   DISCHARGE SECONDARY DIAGNOSES:  1. Diffuse extracranial cerebrovascular occlusive disease generalized      symptoms, no focal symptoms.  2. Hypertension.  3. Dyslipidemia.  4. Diabetes.  5. Depression.  6. Chronic obstructive pulmonary disease.   CONSULTANTS:  Pharmacy on May 09, 2007 for vancomycin.   PROCEDURE:  Left carotid endarterectomy with Dacron patch, done by Dr.  Nelle Mcguire. [name] on May 09, 2007.   BRIEF HISTORY AND PHYSICAL:  [name] is an 77 year old female  referred to for evaluation and screening of cerebrovascular occlusive  disease.  She had suffered several episodes in the past of feeling pre-  syncope associated with blackouts, dizziness and tremulousness of the  arms.  Workup for this revealed extensive extracranial cerebrovascular  occlusive disease.  Arteriography revealed a severe stenosis of the left  internal carotid artery.  A moderate stenosis of the right internal  carotid artery , bilaterally vertebral artery at origin stenosis were  also present.  The patient was a poor candidate for carotid stenting,  secondary to age and degree of calcification of the severe left internal  carotid stenosis.  Despite her COPD, Dr. Madilyn Mcguire felt that she was an  excellent candidate to undergo surgical management with left carotid  endarterectomy.  The relative risk of these are low, with 2% to 3% major  morbidity and mortality.  Then, following this procedure, the patient  could be electively scheduled for a potential vertebral artery  angioplasty and stenting.  At this time, Dr. Madilyn Mcguire increased aspirin to  225 mg daily and scheduled elective left carotid endarterectomy, after  explaining the risks and complications to the patient.  The patient  agreed with the elective surgery and signed consent.   HOSPITAL COURSE:  On March 09, 2007, the patient was admitted to [facility] and taken to the operating room at this time for a left  carotid endarterectomy, for reduction of stroke risk  and relief of  symptoms by Dr. Nelle Mcguire. [name].  The patient remained stable  throughout the procedure, was transferred to PACU stable.  The patient  continued to stay in PACU and was transferred later to 3300.  The  patient continued to remain stable overnight.  On post-op day March 10, 2007:  1) The patient was comfortable, incisions looked fine.  Neurologically, she was intact and labs were okay, did plan to keep the  patient one day and discharge on March 11, 2007.  The patient was on  aspirin.  On March 11, 2007, post-op day of left carotid  endarterectomy, the patient continued to be comfortable with  some  constipation.  The patient was afebrile, vitals were stable.  She had  blood pressure of 132/50, temperature 97.6 and heart rate at 94.  The  patient stated she was ready to go home with no complaints. On physical  examination, the patient was neurologically intact.  Her left neck  incision looked fine with no hematoma or signs of infection and clean  and dry.  Plan at this time was for patient to discharge to home and  continue taking aspirin.  The patient agreed to this and stated she was  ready to go home.   DISCHARGE MEDICATIONS:  1. Humalog insulin sliding scale.  2. NovoLog insulin sliding scale.  3. Zoloft 25 mg daily.  4. Lasix 40 mg daily.  5. Benicar 20 mg daily.  6. Fish oil 1200 mg daily.  7. Magnesium 500 mg daily.  8. Potassium  daily.  9. Actos 45 mg daily.  10.Synthroid 100 mg daily.  11.Protonix 40 mg daily.  12.Multivitamin daily.  13.Calcium plus vitamin D daily, two tablets.  14.Vitamin B12 500 mg daily.  15.Vitamin C 500 mg daily.  16.Aspirin 325 mg daily, and on December 25, the patient will resume      to 81 mg.  17.Vitamin D 1000 mg.  18.Pro-Air inhaler.   DISCHARGE INSTRUCTIONS:  The patient's discharge instructions were  discussed with her on March 11, 2007, the patient agreed and stated  that she understood these instructions, and she will be discharged to  home as long as she continued to remain stable and afebrile.  The  patient was instructed to continue to follow a diabetic diet.  The  patient was instructed to clean wounds with soap and water.  The patient  was instructed to activity slowly and walk with assistance and may  shower post-op 48 hours.  The patient instructed no lifting or driving  for 2 weeks.  Discussed with patient to come back to the office of ER if  any redness, swelling, drainage or fever greater than 101, onset of  severe headache or neurological status change.  The patient acknowledges  instructions.  She needs to follow up with Dr. Madilyn Mcguire in 2 weeks, and the  office will contact to schedule.  The patient also instructed to  continue medications per reconciliation sheet, and also patient was  prescribed Tylox 1 to 2 tablets orally every 4 to 6 hours as needed for  pain.  The patient was discharged to home on March 11, 2007.      Cyndy Freeze, PA      P. Liliane Bade, M.D.  Electronically Signed    ALW/MEDQ  D:  05/28/2007  T:  05/28/2007  Job:  [id number]

## 2010-09-29 NOTE — Discharge Summary (Signed)
[name], [name]             ACCOUNT NO.:  [id number]   MEDICAL RECORD NO.:  [id number]          PATIENT TYPE:  INP   LOCATION:  5712                         FACILITY:  [facility]   PHYSICIAN:  Candyce Churn, M.D.DATE OF BIRTH:  January 16, 1924   DATE OF ADMISSION:  04/24/2006  DATE OF DISCHARGE:  04/26/2006                               DISCHARGE SUMMARY   DISCHARGE DIAGNOSES:  1. Abdominal pain and bloating, resolved.  Likely secondary to      metformin or combination of metformin, glyburide, and Actos.  2. Type 2 diabetes mellitus.  3. Chronic obstructive pulmonary disorder.  4. Hypertension.  5. Gastroesophageal reflux disease.  6. Mild chronic anxiety.  7. Hypothyroidism with reduction in Synthroid therapy.   DISCHARGE MEDICATIONS:  1. Benicar 20 mg daily.  2. Zoloft 25 mg daily.  3. Synthroid 100 mcg daily.  4. Magnesium oxide 400 mg daily, p.r.n. leg cramping but this may be      causing some increase in stool number per day.  5. Protonix 40 mg daily.  6. Oxygen 2 liters nasal cannula as needed.  7. NPH insulin 12 units subcutaneous q. a.m. and 8 units subcutaneous      q.h.s.  8. Albuterol and Atrovent nebs at home 3 to 4 times a day.  9. Lasix 40 mg daily.   HOSPITAL COURSE:  [name] is very pleasant 77 year old  female who was admitted on April 24, 2006, after a prolonged course  of postprandial abdominal pain.  Thought was she might have chronic  mesenteric ischemia but on stopping oral medications in the hospital for  type 2 diabetes mellitus her abdominal pain has resolved.  On the day of  admission she actually had what appears to be a vasovagal reaction at  the time of trying to perform the MRI angiogram of her splenic bed and  the procedure was discontinued.  Since admission she has felt well and  has been able to eat off the Actos, glyburide, and metformin.  It was  felt one of those or a combination of the 3 have caused her symptoms  that we will simply treat with insulin for diabetes for now.  A new pair  of CBGs have been in the 100's and low 200's while hospitalized and we  will be increasing her dose slightly at discharge to 12 units NPH and 8  units NPH in the evening.   LABORATORY DATA:  Laboratories while hospitalized are as follows:  White  cell count 5,900, hemoglobin 10.7, platelet count 281,000 with normal  differential.  CMET revealed sodium 134, potassium 3.8, chloride 96,  bicarb 28, glucose 125, BUN 20, creatinine 0.8, total bili 0.4, alk phos  57, SGOT 26, SGPT 16, total protein 16.6, albumin 3.1, calcium 9.3,  amylase 37, lipase 34.  Free T4 1.77 and TSH mild suppressed at 0.057.   Patient will be discharged home on above medications and will likely  switch over to a mixed dosed insulin such as Humulin 75/25 over the next  several weeks.   CONDITION ON DISCHARGE:  Improved.  Candyce Churn, M.D.  Electronically Signed     RNG/MEDQ  D:  04/26/2006  T:  04/26/2006  Job:  [id number]   cc:   Danise Edge, M.D.

## 2010-09-29 NOTE — Op Note (Signed)
   NAME:  [name], [name]                       ACCOUNT NO.:  [id number]   MEDICAL RECORD NO.:  [id number]                   PATIENT TYPE:  INP   LOCATION:  0366                                 FACILITY:  [facility]   PHYSICIAN:  Danise Edge, M.D.                DATE OF BIRTH:  22-Jun-1923   DATE OF PROCEDURE:  03/06/2002  DATE OF DISCHARGE:                                 OPERATIVE REPORT   PROCEDURE:  Esophagogastroduodenoscopy.   PROCEDURE INDICATION:  Ms. [name] is a 77 year old female with  unexplained upper abdominal pain.   ENDOSCOPIST:  Charolett Bumpers, M.D.   PREMEDICATION:  Versed 3 mg, Demerol 30 mg.   ENDOSCOPE:  Olympus gastroscope.   DESCRIPTION OF PROCEDURE:  After obtaining informed consent, Ms. [name]  patient was placed in the left lateral decubitus position.  I administered  intravenous Versed and intravenous Demerol to achieve conscious sedation for  the procedure.  The patient's blood pressure, oxygen saturation, and cardiac  rhythm were monitored throughout the procedure and documented in the medical  record.   The Olympus gastroscope was passed through the posterior hypopharynx into  the proximal esophagus without difficulty.  The hypopharynx, larynx, and  vocal cords appeared normal.   ESOPHAGOSCOPY:  The proximal, mid, and lower segments of the esophagus  appear normal.  The squamocolumnar junction and esophagogastric junction  were noted at 40 cm from the incisor teeth.   GASTROSCOPY:  Retroflexed view of the gastric cardia and fundus was normal.  The diaphragmatic hiatus was mildly patulous.  The gastric body, antrum, and  pylorus appear normal.   DUODENOSCOPY:  The duodenal bulb, mid duodenum, and distal duodenum appear  normal.   ASSESSMENT:  Normal esophagogastroduodenoscopy.                                               Danise Edge, M.D.   MJ/MEDQ  D:  03/06/2002  T:  03/07/2002  Job:  [id number]   cc:   Candyce Churn, MD  [address] [place]  [place] [address]  Fax: [phone]

## 2010-09-29 NOTE — H&P (Signed)
[name], [name]             ACCOUNT NO.:  [id number]   MEDICAL RECORD NO.:  [id number]          PATIENT TYPE:  INP   LOCATION:  5712                         FACILITY:  [facility]   PHYSICIAN:  Danise Edge, M.D.   DATE OF BIRTH:  07-17-1923   DATE OF ADMISSION:  04/24/2006  DATE OF DISCHARGE:                              HISTORY & PHYSICAL   ADMISSION DIAGNOSES:  1. Chronic abdominal pain.  Rule out chronic mesenteric ischemia      versus medication side effect.  2. Type 2 diabetes mellitus.   HISTORY OF PRESENT ILLNESS:  [name] is an 77 year old  female born 04-07-1924.  [name] has not felt well since  discharge from [facility] November 07, 2005, following antibiotic  treatment for right lower lobe pneumonia complicated by a pleural  effusion.  She does have severe chronic obstructive pulmonary disease.   Her main complaint is post-prandial generalized abdominal pain with  bloating and nonbloody diarrhea.  She has lost 10 pounds over the past  month due to sitophobia.   Her stool C. difficile toxin screen was negative.  Fecal leukocyte  screen was negative.  Flexible proctosigmoidoscopy to 30 cm was normal.  CT scan of the abdomen and pelvis was normal except for hepatomegaly and  left colonic diverticulosis.  Her CBC was normal except for hemoglobin  of 11.6 grams.  Complete metabolic profile was normal except for a  mildly elevated glucose.  Her serum amylase was normal.  Her urinalysis  was normal.   [name] was scheduled to undergo a CT mesenteric angiogram the  afternoon of her admission.  Due to illness and feeling faint, the  procedure was canceled.  Her blood sugar was measured low at 71.   [name] does have chronic type 2 diabetes mellitus.   MEDICATION ALLERGIES:  1. STATINS.  2. PENICILLIN.  3. SULFA.   CHRONIC MEDICATIONS:  1. Lasix 40 mg daily.  2. NPH insulin 12-15 units nightly.  3. Zoloft 25 mg daily.  4.  Benicar 20 mg daily.  5. Actos 45 mg daily.  6. Nasal oxygen 2 liters per minute p.r.n.  7. Glyburide 5 mg twice daily.  8. Metformin 1 g twice daily.  9. Protonix 40 mg each morning.  10.Synthroid 125 mcg daily.  11.Albuterol 4 times daily.  12.Atrovent 4 times daily.   PAST MEDICAL HISTORY:  1. June 2007, [place] hospitalization to treat right lower lobe      pneumonia with pleural effusion.  2. Severe chronic obstructive pulmonary disease.  3. Hypertension.  4. Type 2 diabetes mellitus.  5. Chronic gastroesophageal reflux disease.  6. Chronic anxiety disorder.  7. December 27, 2004, normal esophagogastroduodenoscopy.  8. December 27, 2004, proctocolonoscopy to the hepatic flexure, normal,      except for large internal hemorrhoids and left colonic      diverticulosis.  9. Primary hypothyroidism.  10.March 13, 2002, laparoscopic cholecystectomy.  11.February 12, 2005, air contrast barium enema normal except for      colonic diverticulosis.  12.April 10, 2006, CT scan of the abdomen  and pelvis revealed      hepatomegaly and left colonic diverticulosis.  13.Osteoporosis.  14.April 22, 2006, normal flexible proctosigmoidoscopy to 30 cm.  15.April 14, 2001, proctocolonoscopy to the hepatic flexure normal.   FAMILY HISTORY:  Noncontributory.   SOCIAL HISTORY:  [name] quit smoking cigarettes in 2003 after a 50-  pack-year history.  No alcohol consumption.   REVIEW OF SYSTEMS:  VITAL SIGNS:  Weight 129 pounds, blood pressure  130/50.  Glucose 71.  HEENT:  Anicteric sclera.  Normal oropharynx.  LUNGS:  Clear to auscultation.  CARDIAC:  Regular rhythm without  murmurs.  ABDOMEN:  Soft, flat, and nontender.  The patient experiences  severe pain if she tries to eat food.   ASSESSMENT:  1. Chronic intermittent generalized abdominal pain with weight loss.      Rule out chronic mesentery ischemia versus medication side effects.  2. Type 2 diabetes mellitus.   PLAN:   Stop diabetic medication.  Monitor blood sugars.  Patient may  require a MR mesenteric angiogram and esophagogastroduodenoscopy.           ______________________________  Danise Edge, M.D.     MJ/MEDQ  D:  04/25/2006  T:  04/25/2006  Job:  [id number]   cc:   Candyce Churn, M.D.

## 2010-09-29 NOTE — H&P (Signed)
NAME:  [name], [name] NO.:  [id number]   MEDICAL RECORD NO.:  [id number]                   PATIENT TYPE:  EMS   LOCATION:  ED                                   FACILITY:  [facility]   PHYSICIAN:  Ike Bene, M.D.              DATE OF BIRTH:  1923/12/26   DATE OF ADMISSION:  03/03/2002  DATE OF DISCHARGE:                                HISTORY & PHYSICAL   CHIEF COMPLAINT:  Patient with abdominal pain just below the ribs for five  day.   HISTORY OF PRESENT ILLNESS:  The patient is a 77 year old female who  presented to the emergency department with a four- to five-day history of  progressively worsening colicky epigastric abdominal pain.  The pain lasted  about one minute in duration and has been increasingly worse for the last  several days.  It is not associated with nausea or vomiting.  No diarrhea,  no constipation.  No fevers, chills, sweats, or weakness.  She does have  periodic episodes of diaphoresis, but that is not new.  She was evaluated at  a medical facility at [facility] and was told this was probably gas.  She  had a cardiac evaluation which was apparently normal there.   REVIEW OF SYSTEMS:  She denies any chest pains or palpitations.  No  orthopnea, no chronic coughing, no new shortness of breath.  No nausea, no  vomiting, no diarrhea.  No urinary symptoms.  No new musculoskeletal  symptoms.  No weight gain, no weight loss.  No night sweats, fevers, chills,  diarrhea, change in vision or hearing.   PAST MEDICAL HISTORY:  1. COPD.  Quit smoking in February 2003.  2. Hypertension.  3. Diabetes mellitus x 12 years.  4. Hypothyroidism, with some depression.   PAST SURGICAL HISTORY:  None.   MEDICATIONS:  1. Glucophage 1000 mg b.i.d.  2. Actos 45 mg daily.  3. Glyburide 5 mg daily.  4. Synthroid 125 mcg p.o. q.d.  5. Benicar 20 mg p.o. q.d.  6. Lasix 20 mg daily.  7. Advair 100/50 b.i.d.  8. Nasacort 2 sprays each nostril  once daily.  9. Effexor 37.5 alternating with 75 mg every other day.  10.      Aspirin 81 mg daily.  11.      Vitamin E IU daily.   ALLERGIES:  ALL STATINS.  Also, PENICILLIN and SULFA.   SOCIAL HISTORY:  The patient quit smoking after greater than 50 years of use  in February 2003.  No history of alcohol.  She is retired from office work.  Her first husband died from a cerebral aneurysm.  She is currently  remarried.   FAMILY HISTORY:  Father died, age 43, of lung cancer.  Mother is 88, with  CVA.  Two brothers are deceased:  One from diabetes and one from cancer of  the kidney and bladder.   PHYSICAL  EXAMINATION:  VITAL SIGNS:  Blood pressure 145/65, pulse 78,  respiratory rate 14, afebrile.  HEENT:  Tympanic membranes are normal.  Nasal mucosa and oral mucosa normal.  NECK:  Supple.  No adenopathy, JVD.  No bruits.  LUNGS:  Clear to auscultation bilaterally.  HEART:  Regular rate and rhythm.  Without murmurs, rubs, or gallops.  Chest  wall has no tenderness.  ABDOMEN:  Soft, nontender, nondistended.  Positive bowel sounds.  No  hepatosplenomegaly.  RECTAL:  No blood and no stool.  EXTREMITIES:  No clubbing, no cyanosis, no edema.  She has decreased pulses  distally.  SKIN:  Within normal limits.   LABORATORY DATA:  White count normal at 7.2, hemoglobin slightly low at  10.6, platelet count 272.  BUN, creatinine, electrolytes, glucose  essentially normal.  LFTs normal.  CK normal.  Troponin normal.  Lipase  normal.   Abdominal series normal.  Chest x-ray shows left lower lobe atelectasis.  Right upper quadrant abdominal ultrasound within normal limits.   ASSESSMENT AND PLAN:  1. Patient with five days of epigastric abdominal pain which is colicky in     nature, radiating to her back, lasts one minute each time it happens:     Will rule out an ulcer, rule out aneurysm.  Will get a CT of her abdomen     and pelvis.  Start her on Protonix.  Gallbladder workup appears to be      normal, and she has no abnormal LFTs.  Nausea has occurred only after     getting pain medications.  Will try to limit that.  2. Diabetes mellitus:  Continue current medications without Glucophage.  3. Hypertension:  Restart her usual medications.                                               Ike Bene, M.D.    RM/MEDQ  D:  03/04/2002  T:  03/04/2002  Job:  [id number]

## 2010-09-29 NOTE — Consult Note (Signed)
NAME:  [name], [name]                       ACCOUNT NO.:  [id number]   MEDICAL RECORD NO.:  [id number]                   PATIENT TYPE:  INP   LOCATION:  0366                                 FACILITY:  [facility]   PHYSICIAN:  Thornton Park. Daphine Deutscher, M.D.             DATE OF BIRTH:  [birth year]   DATE OF CONSULTATION:  DATE OF DISCHARGE:                                   CONSULTATION   CHIEF COMPLAINT:  Mid epigastric pain.   HISTORY OF PRESENT ILLNESS:  The patient is a 77 year old lady who was  admitted on March 03, 2002 with 4-5 day history of progressing epigastric  abdominal pain. These are short, lasting about one minute in duration. She  had some happen down at the beach and then came back here and it had been  worse for the last several days. These were not associated with nausea,  vomiting, or diarrhea. She denied any fever, chills, night sweats. Sometime  she will have some bloating.   REVIEW OF SYSTEMS:  Negative for any recent weight loss or chest pain.  Positive for umbilical hernia which has been aggravated by her coughing.   PAST MEDICAL HISTORY:  Notable for chronic obstructive pulmonary disease  although she quit smoking in February of 2003. Hypertension, diabetes  mellitus times twelve years and hypothyroidism.   CURRENT MEDICATIONS:  1. Glucophage 1,000 mg twice a day.  2. Actos 45 mg each day.  3. Glyburide 5 mg each day.  4. Synthroid 125 mcg each day.  5. Benicar 20 mg each day.  6. Lasix 20 mg one each day.  7. Advair 100/50 twice a day.  8. Nasacort two sprays each nostril one each day.  9. Effexor 37.5 alternating with 75 mg every other day.  10.      Aspirin 81 mg each day.   ALLERGIES:  STATINS, PENICILLIN, AND SULFA.   SOCIAL HISTORY:  She has a 50 pack year smoking history.   PHYSICAL EXAMINATION:  GENERAL: She was in no acute distress.  ABDOMEN: Protuberant with palpable reducible umbilical hernia. There is no  rebound, guarding, or localized  tenderness at this time.  CHEST: Shows evidence of chronic obstructive pulmonary disease.  HEART: Regular rate and rhythm. No murmur, rub, or gallop.   SUMMARY:  I discussed this case with Dr. Kevan Ny. The episodic pain sounds a  lot like it could be esophageal spasm. She did undergo a CCK stimulated HIDA  scan today which showed a 17% ejection fraction. This, however, was not  correlating with the time of any abdominal pain. Prior to recommending any  cholecystectomy, would get an upper endoscopy and make sure that she does  not have evidence of esophagitis or other reason for esophageal spasm. Also,  would get cardiac clearance before recommending cholecystectomy. Will  discuss and follow with Dr. Kevan Ny.   IMPRESSION:  Biliary dyskinesia.   PLAN:  Further delineation of this symptom.  Thornton Park Daphine Deutscher, M.D.    MBM/MEDQ  D:  03/05/2002  T:  03/05/2002  Job:  [id number]   cc:   Candyce Churn, MD  [address] [place]  [place] [address]  Fax: [phone]

## 2010-09-29 NOTE — Discharge Summary (Signed)
   NAME:  ROSANGELICA, PEVEHOUSE                       ACCOUNT NO.:  [id number]   MEDICAL RECORD NO.:  [id number]                   PATIENT TYPE:  INP   LOCATION:  5735                                 FACILITY:  [facility]   PHYSICIAN:  Thornton Park. Daphine Deutscher, M.D.             DATE OF BIRTH:  19-Mar-1924   DATE OF ADMISSION:  03/13/2002  DATE OF DISCHARGE:  03/17/2002                                 DISCHARGE SUMMARY   ADMISSION DIAGNOSIS:  Acute cholecystitis.   PROCEDURE:  Laparoscopic cholecystectomy and intraoperative cholangiogram,  03/13/02.   HOSPITAL COURSE:  Keirah Konitzer underwent the aforementioned laparoscopic  cholecystectomy.  She has underlying COPD which necessitated a longer stay  but she was ready for discharge on 03/17/02.   FOLLOW UP:  Followup per Dr. Johnella Moloney and per my office.   FINAL DIAGNOSIS:  Chronic cholecystitis with history of biliary dyskinesia.                                               Thornton Park Daphine Deutscher, M.D.    MBM/MEDQ  D:  04/15/2002  T:  04/16/2002  Job:  [id number]

## 2010-10-04 ENCOUNTER — Ambulatory Visit (INDEPENDENT_AMBULATORY_CARE_PROVIDER_SITE_OTHER): Payer: Medicare Other | Admitting: Adult Health

## 2010-10-04 ENCOUNTER — Encounter: Payer: Self-pay | Admitting: Adult Health

## 2010-10-04 VITALS — BP 132/72 | HR 76 | Temp 98.0°F | Ht 61.0 in | Wt 124.4 lb

## 2010-10-04 DIAGNOSIS — J4489 Other specified chronic obstructive pulmonary disease: Secondary | ICD-10-CM

## 2010-10-04 DIAGNOSIS — J449 Chronic obstructive pulmonary disease, unspecified: Secondary | ICD-10-CM

## 2010-10-04 NOTE — Patient Instructions (Addendum)
Prednisone at  20mg  for 1 week then alternate 20 and 10mg  for 1 week Then go back to 10mg  daily -then follow  Med calendar for prednisone dosing in futureuntil seen back in office.  follow up  4 weeks and As needed   Mucinex DM Twice daily  As needed  Cough/congestion   Please contact office for sooner follow up if symptoms do not improve or worsen or seek emergency care

## 2010-10-05 NOTE — Assessment & Plan Note (Signed)
Resolving AECOPD  Plan:  Prednisone at  20mg  for 1 week then alternate 20 and 10mg  for 1 week Then go back to 10mg  daily -then follow  Med calendar for prednisone dosing in futureuntil seen back in office.  follow up  4 weeks and As needed   Mucinex DM Twice daily  As needed  Cough/congestion   Please contact office for sooner follow up if symptoms do not improve or worsen or seek emergency care

## 2010-10-05 NOTE — Progress Notes (Signed)
Subjective:    Patient ID: [name], female    DOB: [birth year], 77 y.o.   MRN: [id number]  HPI  88 yowf quit smoking 2002 with onset doe and a mild chronic cough. She subsequently gained 15 pounds and beginning about the middle of 2006 began to have exacerbations with coughing wheezing and shortness of breath which required short course of prednisone to get her back to baseline but requiring Prednisone daily since 3/09.   December 22, 2007 ov no decrease in ex tol albeit on oxygen at 24 hours a day and maintaining prednisone at 10 mg daily with gradual improvement in dyspnea.   March 16, 2010  c/o sharp pain substernally radiating back along lower rib cage, c/o dyspnea , no wheeze, no signs of cold - Not taking spiriva  Steroid dependent, takes pulmicort & duonebs  Told at [facility] that she has clogged arteries causing abdominal pain  EKG - nSR with RBBB& left bifascicular block - q waves in V1-2 related to BBB ? vs old MI   April 03, 2010 --Presents for follow up. Had fall 3 weeks ago. She lost her balance in closet 10/29 and fell forward. Had bilateral sharp rib pain w/ radiation into back. CXR w/ no acute changes. Seen at urgent care 2 weeks go, tx w/ advil. She is no better. Has sudden sharp pain in ribs bialterally w/ pain into back-catching pain w/ sudden movement. Denies chest pain, dyspnea, orthopnea, hemoptysis, fever, n/v/d, edema, headache. Her to breathe in at times. dx T 9 fx > Deveshwar eval, no rx.   April 20, 2010 ov cc sob x 3 weeks, sob at rest, no unusual cough/ congestion, does ok sleeping . Increased pred back to 20 mg per day with no benefit. no purulent sputum. back better, no leg c/ow just gen fatigue. Pt denies any significant sore throat, nasal congestion or excess secretions, fever, chills, sweats, unintended wt loss, pleuritic or exertional cp, orthopnea pnd or leg swelling.   Jan 2012 Hosp much better and 10 days of avelox >  Then cipro and able  prednisone   08/22/10 Mid march prednisone up to 40 and another round of cipro >  Looser less yellow and pred down to 10 mg per day.  Confused with meds/ instructions and maint vs prn.   >>rx perforomoist and spiriva   09/19/10 Acute OV Complains of prod cough with yellow mucus (clearing), wheezing - states feels "about the same" even with doxy rx given 5.4.12.  daughter reports increased pred to 20mg  on 5.4.12. She is some better and mucus is mostly clear.Marland Kitchen She was at beach last week when cough got worse. Cant stop coughing at times. Worse at night. >>rec to finish doxyc, and steroid bump to 40mg  w/ taper to hold at 20mg    10/04/10 Follow up  2 week follow up. Last ov w/ AECOPD. She was tx with doxyc and steroid bump at 40mg  . She states wheezing and cough have improved, still having clear mucus production and weakness. Feels a lot better. Continues to have no energy.  No discolored mucus or fever. No increased swelling.  She is currently at 20mg  of prednsione daily    Review of Systems Constitutional:   No  weight loss, night sweats,  Fevers, chills,  +fatigue, or  lassitude.  HEENT:   No headaches,  Difficulty swallowing,  Tooth/dental problems, or  Sore throat,                No  sneezing, itching, ear ache, nasal congestion, post nasal drip,   CV:  No chest pain,  Orthopnea, PND, swelling in lower extremities, anasarca, dizziness, palpitations, syncope.   GI  No heartburn, indigestion, abdominal pain, nausea, vomiting, diarrhea, change in bowel habits, loss of appetite, bloody stools.   Resp:  No hemoptysis or  chest wall deformity  Skin: no rash or lesions.  GU: no dysuria, change in color of urine, no urgency or frequency.  No flank pain, no hematuria   MS:  No joint pain or swelling.  No decreased range of motion.  No back pain.  Psych:  No change in mood or affect. No depression or anxiety.  No memory loss.          Objective:   Physical Exam GEN: A/Ox3; pleasant , NAD,  elderly female   HEENT:  [place]/AT,  EACs-clear, TMs-wnl, NOSE-clear, THROAT-clear, no lesions, no postnasal drip or exudate noted.   NECK:  Supple w/ fair ROM; no JVD; normal carotid impulses w/o bruits; no thyromegaly or nodules palpated; no lymphadenopathy.  RESP  Coarse BS w/ scattered rhonchi, no wheezing    CARD:  RRR, no m/r/g  , no peripheral edema, pulses intact, no cyanosis or clubbing.  GI:   Soft & nt; nml bowel sounds; no organomegaly or masses detected.  Musco: Warm bil, no deformities or joint swelling noted.   Neuro: alert, no focal deficits noted.    Skin: Warm, no lesions or rashes            Assessment & Plan:

## 2010-10-12 ENCOUNTER — Other Ambulatory Visit: Payer: Self-pay | Admitting: *Deleted

## 2010-11-03 ENCOUNTER — Other Ambulatory Visit (INDEPENDENT_AMBULATORY_CARE_PROVIDER_SITE_OTHER): Payer: Medicare Other

## 2010-11-03 ENCOUNTER — Ambulatory Visit (INDEPENDENT_AMBULATORY_CARE_PROVIDER_SITE_OTHER): Payer: Medicare Other

## 2010-11-03 DIAGNOSIS — I6529 Occlusion and stenosis of unspecified carotid artery: Secondary | ICD-10-CM

## 2010-11-03 DIAGNOSIS — Z48812 Encounter for surgical aftercare following surgery on the circulatory system: Secondary | ICD-10-CM

## 2010-11-03 NOTE — H&P (Signed)
HISTORY AND PHYSICAL EXAMINATION  November 03, 2010  Re:  Michelle, Mcguire             DOB:  01/16/1924  DIAGNOSIS:  Carotid occlusive disease.  HISTORY OF PRESENT ILLNESS:  This is an 77 year old white female with a history of left carotid endarterectomy in December 2008 by Dr. Madilyn Fireman. At that time she had suffered several episodes of presyncope associated with near blackouts and dizziness as well as tremors of both her arms. Her last follow-up with [place] was in January 2010.  She and her daughter present today with complaints of right hand tremors that have been intermittent in the past 2-3 weeks as well as decreased energy, weak, and decreased memory.  She denies any presyncope or dizziness or blackouts.  She also states that she has some unsteady gait.  Otherwise, she denies any temporary blindness, amaurosis fugax, slurred speech, hemiparesis, or paresthesias.  The patient returns today with a carotid duplex scan.  PAST MEDICAL/SURGICAL HISTORY: 1. History of cerebrovascular disease.     a.     Status post left carotid endarterectomy in 2008. 2. Diabetes. 3. Severe COPD.     a.     On 2 liters of home O2. 4. Hypertension. 5. Hyperthyroidism. 6. Peripheral vascular disease. 7. Iron deficiency anemia. 8. GERD. 9. Anxiety. 10.History of vertigo. 11.History of fracture of her left lower extremity. 12.Hyperlipidemia. 13.Osteoporosis. 14.History of mesenteric ischemia. 15.History of compression fractures that healed. 16.Constipation. 17.Celiac artery stenosis.  ALLERGIES: 1. Spiriva. 2. Sulfa. 3. Penicillin. 4. Statins.  CURRENT MEDICATIONS: 1. Albuterol 2.5 mg aerosolized q.i.d. 2. Valium 5 mg 1/2 tablet b.i.d. 3. Fleet's enema p.r.n. constipation. 4. Aspirin 81 mg 3 tablets p.o. daily. 5. Benicar 20 mg 1/2 tablet daily. 6. Caltrate 600 mg with vitamin D p.o. b.i.d. 7. MiraLax daily. 8. Iron sulfate 325 mg daily. 9. Humalog 75/25 8-35 units subcu  b.i.d. 10.Maalox 2-4 tablespoons orally q.i.d. p.r.n. 11.Magnesium 500 mg daily. 12.Multivitamin 1 p.o. daily. 13.Sliding scale NovoLog insulin per previous home scale at lunch. 14.Omeprazole 20 mg p.o. b.i.d. 15.Potassium gluconate 595 mg p.o. daily. 16.Prednisone 10 mg daily. 17.Albuterol 2 puffs inhaler q.4h. p.r.n. 18.Pulmicort 0.5 mg/2 mL nebulizer b.i.d. 19.Saline nasal spray 2 sprays nightly p.r.n. 20.Sertraline 50 mg 1/2 to 1 tablet daily. 21.Synthroid 88 mcg daily. 22.Systane eye drops 1 drop in both eyes b.i.d. 23.Vicodin 5/500 one-half to 1 tablet q.6h. p.r.n. 24.Vitamin B12 1000 mcg 1/2 tablet daily. 25.Vitamin C 1000 mg daily. 26.Vitamin D3 1000 units daily.  SOCIAL HISTORY:  She has remote tobacco use.  She is married with 2 children.  Denies ETOH.  She is retired.  FAMILY HISTORY:  Mother died at age 31 of CVA.  Father died at age at 85 of cancer.  REVIEW OF SYSTEMS:  GENERAL:  She has had an 8-pound weight loss over the past 6 months, otherwise denies fever, chills, nausea, vomiting. VASCULAR:  She denies any stroke symptoms or mini stroke symptoms. Denies slurred speech, temporary blindness, facial droop, or amaurosis fugax.  She also denies any paresthesias or hemiparesis. CARDIAC:  She denies chest pain or any history of MI.  She does have occasional shortness of breath when lying flat and uses breathing treatments. GI: Denies any history of melena.  She has had some hematochezia but does have hemorrhoids.  She does have acid reflux disease as well as mesenteric ischemia. NEUROLOGIC:  Denies any dizziness, blackouts or headaches.  She does have some unsteady gait and right hand  tremors that are intermittent. PULMONARY:  She is on 2 liters of O2 at home 24 hours a day.  She had bronchitis earlier this year and pneumonia in January of this year. Denies any hemoptysis. HEMATOLOGIC:  She denies any bleeding or clotting disorders. URINARY:  Denies any  difficulty urinating.  Denies hematuria or incontinence. EENT:  She denies any changes in her eyesight or hearing.  She denies any nosebleeds. MUSCULOSKELETAL:  She has a history of compression fractures.  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 PSYCHIATRIC:  She does have some anxiety.  PHYSICAL EXAM:  Blood pressure is 130/50, heart rate 76, O2 saturation 99% on 2 liters of O2 nasal cannula.  General: She is in no acute distress.  She is well-nourished.  HEENT:  Pupils are equal, round, reactive to light.  Conjunctiva normal.  Lungs were clear to auscultation bilaterally.  No rales, rhonchi or wheezing.  Neuro: There are no focal defects.  No carotid bruits are heard.  Heart:  A regular rate and rhythm.  No murmurs are heard.  Lungs: Clear to auscultation bilaterally.  Abdomen: Soft, nontender, nondistended.  Positive bowel sounds.  No masses are felt.  Musculoskeletal: There are no major deformities.  Extremities: She does have 1+ to 2+ pitting edema in her lower extremities bilaterally.  There are 2+ radial pulses bilaterally and 2+ posterior tibial pulses bilaterally.  ASSESSMENT:  This is an 77 year old with cerebrovascular disease and a history of left carotid endarterectomy.  Her carotid duplex scan today reveals right internal carotid artery stenosis in the 60% to 79% range, which is essentially unchanged from her scan in October 2009.  Her left internal carotid artery is patent with a history of endarterectomy.  She does have abnormal antegrade and blunted waveforms of the left vertebral artery.  PLAN:  Return in 6 months with another carotid duplex scan.  Newton Pigg, [place]  Fransisco Hertz, MD Electronically Signed  SE/MEDQ  D:  11/03/2010  T:  11/03/2010  Job:  [id number]

## 2010-11-13 ENCOUNTER — Ambulatory Visit: Payer: Medicare Other | Admitting: Adult Health

## 2010-11-16 NOTE — Procedures (Unsigned)
CAROTID DUPLEX EXAM  INDICATION:  Followup carotid stenosis.  HISTORY: Diabetes:  Yes. Cardiac:  No. Hypertension:  Yes. Smoking:  Previous. Previous Surgery:  Left carotid endarterectomy 05/09/2007 with Dr. Madilyn Fireman. CV History:  Asymptomatic. Amaurosis Fugax No, Paresthesias No, Hemiparesis No                                      RIGHT             LEFT Brachial systolic pressure:         132               130 Brachial Doppler waveforms:         WNL               WNL Vertebral direction of flow:        Antegrade         Antegrade / blunted DUPLEX VELOCITIES (cm/sec) CCA peak systolic                   79                94 ECA peak systolic                   243               127 ICA peak systolic                   221               92 ICA end diastolic                   67                14 PLAQUE MORPHOLOGY:                  Calcified         Calcified PLAQUE AMOUNT:                      Moderate to severe                  Mild PLAQUE LOCATION:                    CCA, ICA, ECA     CCA, ECA  IMPRESSION: 1. Right internal carotid artery stenosis in the 60%-79% range. 2. Right external carotid artery stenosis present. 3. Left internal carotid artery appears patent with history of     endarterectomy.  No elevated velocities are identified on today's     study. 4. Abnormal antegrade and blunted waveform of the left vertebral     artery. 5. Right carotid system is essentially unchanged since previous study     on 02/26/2008. 6. Left carotid artery system velocities have appeared to normalize     since previous study on 02/26/2008.      ___________________________________________ Quita Skye. Hart Rochester, M.D.  SH/MEDQ  D:  11/03/2010  T:  11/03/2010  Job:  [id number]

## 2010-11-17 ENCOUNTER — Ambulatory Visit: Payer: Medicare Other | Admitting: Adult Health

## 2010-11-20 ENCOUNTER — Telehealth: Payer: Self-pay | Admitting: Adult Health

## 2010-11-20 NOTE — Telephone Encounter (Signed)
[email or web address] Aja, CMA  

## 2010-11-21 ENCOUNTER — Ambulatory Visit (INDEPENDENT_AMBULATORY_CARE_PROVIDER_SITE_OTHER): Payer: Medicare Other | Admitting: Adult Health

## 2010-11-21 ENCOUNTER — Encounter: Payer: Self-pay | Admitting: Adult Health

## 2010-11-21 DIAGNOSIS — J449 Chronic obstructive pulmonary disease, unspecified: Secondary | ICD-10-CM

## 2010-11-21 MED ORDER — LEVOFLOXACIN 500 MG PO TABS: 500.0000 mg | ORAL_TABLET | Freq: Every day | ORAL | Status: AC

## 2010-11-21 NOTE — Patient Instructions (Addendum)
Levaquin 500mg  daily for 7 days  Mucinex DM Twice daily  As needed  Cough/congestion  Fluids and rest  Increase Prednisone 20mg  daily for 1 week, then back 10mg  daily  Please contact office for sooner follow up if symptoms do not improve or worsen or seek emergency care  follow up Dr. Sherene Sires  In 4 weeks

## 2010-11-21 NOTE — Assessment & Plan Note (Addendum)
Exacerbation  Will cover with more broad spectrum since recent hospitlization   Plan:  Levaquin 500mg  daily for 7 days  Mucinex DM Twice daily  As needed  Cough/congestion  Fluids and rest  Increase Prednisone 20mg  daily for 1 week, then back 10mg  daily  Please contact office for sooner follow up if symptoms do not improve or worsen or seek emergency care  follow up Dr. Sherene Sires  In 4 weeks

## 2010-11-21 NOTE — Progress Notes (Signed)
Subjective:    Patient ID: [name], female    DOB: 1923-11-30, 77 y.o.   MRN: [id number]  HPI 70 yowf quit smoking 2002 with onset doe and a mild chronic cough. She subsequently gained 15 pounds and beginning about the middle of 2006 began to have exacerbations with coughing wheezing and shortness of breath which required short course of prednisone to get her back to baseline but requiring Prednisone daily since 3/09.   December 22, 2007 ov no decrease in ex tol albeit on oxygen at 24 hours a day and maintaining prednisone at 10 mg daily with gradual improvement in dyspnea.   March 16, 2010  c/o sharp pain substernally radiating back along lower rib cage, c/o dyspnea , no wheeze, no signs of cold - Not taking spiriva  Steroid dependent, takes pulmicort & duonebs  Told at [facility] that she has clogged arteries causing abdominal pain  EKG - nSR with RBBB& left bifascicular block - q waves in V1-2 related to BBB ? vs old MI   April 03, 2010 --Presents for follow up. Had fall 3 weeks ago. She lost her balance in closet 10/29 and fell forward. Had bilateral sharp rib pain w/ radiation into back. CXR w/ no acute changes. Seen at urgent care 2 weeks go, tx w/ advil. She is no better. Has sudden sharp pain in ribs bialterally w/ pain into back-catching pain w/ sudden movement. Denies chest pain, dyspnea, orthopnea, hemoptysis, fever, n/v/d, edema, headache. Her to breathe in at times. dx T 9 fx > Deveshwar eval, no rx.   April 20, 2010 ov cc sob x 3 weeks, sob at rest, no unusual cough/ congestion, does ok sleeping . Increased pred back to 20 mg per day with no benefit. no purulent sputum. back better, no leg c/ow just gen fatigue. Pt denies any significant sore throat, nasal congestion or excess secretions, fever, chills, sweats, unintended wt loss, pleuritic or exertional cp, orthopnea pnd or leg swelling.   Jan 2012 Hosp much better and 10 days of avelox >  Then cipro and able prednisone     08/22/10 Mid march prednisone up to 40 and another round of cipro >  Looser less yellow and pred down to 10 mg per day.  Confused with meds/ instructions and maint vs prn.   >>rx perforomoist and spiriva   09/19/10 Acute OV Complains of prod cough with yellow mucus (clearing), wheezing - states feels "about the same" even with doxy rx given 5.4.12.  daughter reports increased pred to 20mg  on 5.4.12. She is some better and mucus is mostly clear.Marland Kitchen She was at beach last week when cough got worse. Cant stop coughing at times. Worse at night. >>rec to finish doxyc, and steroid bump to 40mg  w/ taper to hold at 20mg    10/04/10 Follow up  2 week follow up. Last ov w/ AECOPD. She was tx with doxyc and steroid bump at 40mg  . She states wheezing and cough have improved, still having clear mucus production and weakness. Feels a lot better. Continues to have no energy.  No discolored mucus or fever. No increased swelling.  She is currently at 20mg  of prednsione daily   11/21/2010 Acute OV  Pt presents for an acute office visit. Complains of increased SOB, wheezing, prod cough with yellow/brown mucus x 4 days.  Pt is here with her daughter today. 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 She was having significant abdominal pain w/  n/v and weight loss over last several weeks -she is feeling so much better w/ improved appetite and resolution of abdominal pain. She started with cough and congestion after discharge. No fever or chest pain. No increased edema .  SHe is currently on prednisone 20mg  daily . She is now on plavix .Denies hemoptysis.      Review of Systems Constitutional:   No  weight loss, night sweats,  Fevers, chills, fatigue, or  lassitude.  HEENT:   No headaches,  Difficulty swallowing,  Tooth/dental problems, or  Sore throat,                No sneezing, itching, ear ache, nasal congestion, post nasal drip,   CV:  No chest pain,  Orthopnea, PND, swelling  in lower extremities, anasarca, dizziness, palpitations, syncope.   GI  No heartburn, indigestion, abdominal pain, nausea, vomiting, diarrhea, change in bowel habits, loss of appetite, bloody stools.   Resp:   No coughing up of blood. Marland Kitchen  No chest wall deformity  Skin: no rash or lesions.  GU: no dysuria, change in color of urine, no urgency or frequency.  No flank pain, no hematuria   MS:  No joint pain or swelling.  No decreased range of motion.  No back pain.  Psych:  No change in mood or affect. No depression or anxiety.  No memory loss.         Objective:   Physical Exam GEN: A/Ox3; pleasant , NAD, frail and elderly   HEENT:  [place]/AT,  EACs-clear, TMs-wnl, NOSE-clear, THROAT-clear, no lesions, no postnasal drip or exudate noted.   NECK:  Supple w/ fair ROM; no JVD; normal carotid impulses w/o bruits; no thyromegaly or nodules palpated; no lymphadenopathy.  RESP  Coarse BS w/ no wheezing.  no accessory muscle use, no dullness to percussion  CARD:  RRR, no m/r/g  , no peripheral edema, pulses intact, no cyanosis or clubbing.  GI:   Soft & nt; nml bowel sounds; no organomegaly or masses detected.  Musco: Warm bil, no deformities or joint swelling noted.   Neuro: alert, no focal deficits noted.    Skin: Warm, no lesions or rashes         Assessment & Plan:

## 2010-11-21 NOTE — Telephone Encounter (Signed)
Spoke with the pt daughter and she states they have an appt today with TP and they will discuss issue with her at this time. [name], CMA

## 2010-11-24 ENCOUNTER — Telehealth: Payer: Self-pay | Admitting: Adult Health

## 2010-11-24 NOTE — Telephone Encounter (Signed)
I spoke with pt's daughter, Alona Bene, and pt is refusing to take anymore Levaquin. She says it makes her feel "crazy" and is disturbing her sleep. She does not think this is due to the increased dosage of Prednisone because she does tapers of this periodically and it does not affect her this way. Pt does have Cipro 500 mg tablets from a previous RX and wants to know if she can take this, instead of, the Levaquin for the remaining 4 days of treatment. Daughter says TP only gave the pt a broader spectrum abx due to recent hospitalization. Pls avise. Allergies  Allergen Reactions  . Penicillins   . Statins   . Sulfonamide Derivatives

## 2010-11-24 NOTE — Telephone Encounter (Signed)
Ok to use cipro

## 2010-11-24 NOTE — Telephone Encounter (Signed)
Pt daughter advised. Jennifer Castillo, CMA  

## 2010-11-24 NOTE — Telephone Encounter (Signed)
[email or web address].Everline Mahaffy, CMA  

## 2010-12-20 ENCOUNTER — Ambulatory Visit: Payer: Medicare Other | Admitting: Adult Health

## 2010-12-27 ENCOUNTER — Ambulatory Visit: Payer: Medicare Other | Admitting: Adult Health

## 2010-12-28 ENCOUNTER — Encounter: Payer: Self-pay | Admitting: Adult Health

## 2010-12-28 ENCOUNTER — Ambulatory Visit (INDEPENDENT_AMBULATORY_CARE_PROVIDER_SITE_OTHER): Payer: Medicare Other | Admitting: Adult Health

## 2010-12-28 DIAGNOSIS — J449 Chronic obstructive pulmonary disease, unspecified: Secondary | ICD-10-CM

## 2010-12-28 NOTE — Progress Notes (Signed)
Subjective:    Patient ID: [name], female    DOB: Aug 04, 1923, 77 y.o.   MRN: [id number]  HPI  48 yowf quit smoking 2002 with onset doe and a mild chronic cough. She subsequently gained 15 pounds and beginning about the middle of 2006 began to have exacerbations with coughing wheezing and shortness of breath which required short course of prednisone to get her back to baseline but requiring Prednisone daily since 3/09.   December 22, 2007 ov no decrease in ex tol albeit on oxygen at 24 hours a day and maintaining prednisone at 10 mg daily with gradual improvement in dyspnea.   March 16, 2010  c/o sharp pain substernally radiating back along lower rib cage, c/o dyspnea , no wheeze, no signs of cold - Not taking spiriva  Steroid dependent, takes pulmicort & duonebs  Told at [facility] that she has clogged arteries causing abdominal pain  EKG - nSR with RBBB& left bifascicular block - q waves in V1-2 related to BBB ? vs old MI   April 03, 2010 --Presents for follow up. Had fall 3 weeks ago. She lost her balance in closet 10/29 and fell forward. Had bilateral sharp rib pain w/ radiation into back. CXR w/ no acute changes. Seen at urgent care 2 weeks go, tx w/ advil. She is no better. Has sudden sharp pain in ribs bialterally w/ pain into back-catching pain w/ sudden movement. Denies chest pain, dyspnea, orthopnea, hemoptysis, fever, n/v/d, edema, headache. Her to breathe in at times. dx T 9 fx > Deveshwar eval, no rx.   April 20, 2010 ov cc sob x 3 weeks, sob at rest, no unusual cough/ congestion, does ok sleeping . Increased pred back to 20 mg per day with no benefit. no purulent sputum. back better, no leg c/ow just gen fatigue. Pt denies any significant sore throat, nasal congestion or excess secretions, fever, chills, sweats, unintended wt loss, pleuritic or exertional cp, orthopnea pnd or leg swelling.   Jan 2012 Hosp much better and 10 days of avelox >  Then cipro and able  prednisone   08/22/10 Mid march prednisone up to 40 and another round of cipro >  Looser less yellow and pred down to 10 mg per day.  Confused with meds/ instructions and maint vs prn.   >>rx perforomoist and spiriva   09/19/10 Acute OV Complains of prod cough with yellow mucus (clearing), wheezing - states feels "about the same" even with doxy rx given 5.4.12.  daughter reports increased pred to 20mg  on 5.4.12. She is some better and mucus is mostly clear.Marland Kitchen She was at beach last week when cough got worse. Cant stop coughing at times. Worse at night. >>rec to finish doxyc, and steroid bump to 40mg  w/ taper to hold at 20mg    10/04/10 Follow up  2 week follow up. Last ov w/ AECOPD. She was tx with doxyc and steroid bump at 40mg  . She states wheezing and cough have improved, still having clear mucus production and weakness. Feels a lot better. Continues to have no energy.  No discolored mucus or fever. No increased swelling.  She is currently at 20mg  of prednsione daily   11/21/2010 Acute OV  Pt presents for an acute office visit. Complains of increased SOB, wheezing, prod cough with yellow/brown mucus x 4 days.  Pt is here with her daughter today. 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 She was having significant abdominal pain w/  n/v and weight loss over last several weeks -she is feeling so much better w/ improved appetite and resolution of abdominal pain. She started with cough and congestion after discharge. No fever or chest pain. No increased edema .  SHe is currently on prednisone 20mg  daily . She is now on plavix .Denies hemoptysis. >>Levaquin /steroid taper.   12/28/2010 Follow up  Returns for 1 month follow up . She has AECOPD last ov , tx w/ Levaquin and steroid taper . Says she is feeling better back to her baseline. Currently on pred 10mg  daily . No flare in cough or dyspnea. No leg swelling . No fever.  Says she is off Spiriva- messed with her eyes  in past.       Review of Systems  Constitutional:   No  weight loss, night sweats,  Fevers, chills, fatigue, or  lassitude.  HEENT:   No headaches,  Difficulty swallowing,  Tooth/dental problems, or  Sore throat,                No sneezing, itching, ear ache, nasal congestion, post nasal drip,   CV:  No chest pain,  Orthopnea, PND, swelling in lower extremities, anasarca, dizziness, palpitations, syncope.   GI  No heartburn, indigestion, abdominal pain, nausea, vomiting, diarrhea, change in bowel habits, loss of appetite, bloody stools.   Resp:   No coughing up of blood. Marland Kitchen  No chest wall deformity  Skin: no rash or lesions.  GU: no dysuria, change in color of urine, no urgency or frequency.  No flank pain, no hematuria   MS:  No joint pain or swelling.  No decreased range of motion.  No back pain.  Psych:  No change in mood or affect. No depression or anxiety.  No memory loss.         Objective:   Physical Exam  GEN: A/Ox3; pleasant , NAD, frail and elderly   HEENT:  [place]/AT,  EACs-clear, TMs-wnl, NOSE-clear, THROAT-clear, no lesions, no postnasal drip or exudate noted.   NECK:  Supple w/ fair ROM; no JVD; normal carotid impulses w/o bruits; no thyromegaly or nodules palpated; no lymphadenopathy.  RESP  Coarse BS w/ no wheezing.  no accessory muscle use, no dullness to percussion  CARD:  RRR, no m/r/g  , no peripheral edema, pulses intact, no cyanosis or clubbing.  GI:   Soft & nt; nml bowel sounds; no organomegaly or masses detected.  Musco: Warm bil, no deformities or joint swelling noted.   Neuro: alert, no focal deficits noted.    Skin: Warm, no lesions or rashes         Assessment & Plan:

## 2010-12-28 NOTE — Patient Instructions (Signed)
Continue on current meds  Follow up in 2 months and As needed

## 2011-02-16 LAB — TYPE AND SCREEN: Antibody Screen: NEGATIVE

## 2011-02-16 LAB — CBC
HCT: 30.4 — ABNORMAL LOW
Hemoglobin: 10.1 — ABNORMAL LOW
MCHC: 33.3
MCV: 93
MCV: 94
Platelets: 227
Platelets: 259
RDW: 14.4
WBC: 10.3
WBC: 7

## 2011-02-16 LAB — COMPREHENSIVE METABOLIC PANEL
AST: 27
Albumin: 3.5
Calcium: 9.5
Creatinine, Ser: 0.92
GFR calc Af Amer: >60
Total Protein: 7.1

## 2011-02-16 LAB — BLOOD GAS, ARTERIAL
Acid-Base Excess: 2.7 — ABNORMAL HIGH
Drawn by: 274481
O2 Saturation: 98
pCO2 arterial: 43.3

## 2011-02-16 LAB — URINALYSIS, ROUTINE W REFLEX MICROSCOPIC
Nitrite: NEGATIVE
Specific Gravity, Urine: 1.011
pH: 6

## 2011-02-16 LAB — BASIC METABOLIC PANEL
BUN: 23
Chloride: 100
Glucose, Bld: 210 — ABNORMAL HIGH
Potassium: 4.3
Sodium: 138

## 2011-02-16 LAB — APTT: aPTT: 24

## 2011-02-19 ENCOUNTER — Telehealth: Payer: Self-pay | Admitting: Internal Medicine

## 2011-02-19 LAB — BASIC METABOLIC PANEL
CO2: 28
Calcium: 9.4
Chloride: 101
Glucose, Bld: 106 — ABNORMAL HIGH
Sodium: 137

## 2011-02-19 LAB — CBC
Hemoglobin: 10.8 — ABNORMAL LOW
MCHC: 34.1
MCV: 90.4
RBC: 3.48 — ABNORMAL LOW
RDW: 14.4

## 2011-02-19 LAB — PROTIME-INR: INR: 1

## 2011-02-19 NOTE — Telephone Encounter (Signed)
[email or web address]  

## 2011-02-19 NOTE — Telephone Encounter (Signed)
Increase preed to 20 mg for now Further recomm by dr wert can wait until tomorrow

## 2011-02-19 NOTE — Telephone Encounter (Signed)
Add on to schedule for 10/9, bring all active meds with her

## 2011-02-19 NOTE — Telephone Encounter (Signed)
I spoke with pt daughter and she states pt has a productive cough w/ grey phlem, little wheezing, very little chest tightness, pt felt a little unstable this morning. Pt denies any fever, nausea, vomiting. Pt was on cipro x 8 days but has finished. Daughter thinks pt needs another round of abx. Their are no available openings today nor tomorrow and daughter states she does not want to go to the [facility] location. Will forward to the doc of the day for recs for pt. Please advise Dr. Vassie Loll, Thanks  Allergies  Allergen Reactions  . Penicillins   . Statins   . Sulfonamide Derivatives     Carver Fila, CMA

## 2011-02-20 NOTE — Telephone Encounter (Signed)
I spoke with joyce and she states pt can't come in today and is fine to come in tomorrow to see TP tomorrow at 10:15. I advised Alona Bene if pt gets worse then to seek emergency care. She verbalized understanding and had no questions

## 2011-02-20 NOTE — Telephone Encounter (Signed)
[name] is returning triage's call & can be reached at  [phone].  [name]

## 2011-02-20 NOTE — Telephone Encounter (Signed)
[email or web address]  

## 2011-02-21 ENCOUNTER — Ambulatory Visit (INDEPENDENT_AMBULATORY_CARE_PROVIDER_SITE_OTHER): Payer: Medicare Other | Admitting: Adult Health

## 2011-02-21 ENCOUNTER — Encounter: Payer: Self-pay | Admitting: Adult Health

## 2011-02-21 VITALS — BP 118/62 | HR 68 | Temp 97.2°F | Ht 61.0 in | Wt 133.4 lb

## 2011-02-21 DIAGNOSIS — J449 Chronic obstructive pulmonary disease, unspecified: Secondary | ICD-10-CM

## 2011-02-21 LAB — COMPREHENSIVE METABOLIC PANEL WITH GFR
AST: 27
Albumin: 3.1 — ABNORMAL LOW
Alkaline Phosphatase: 71
Chloride: 102
GFR calc Af Amer: 56 — ABNORMAL LOW
Potassium: 3.8
Sodium: 139
Total Bilirubin: 0.4

## 2011-02-21 LAB — COMPREHENSIVE METABOLIC PANEL
ALT: 21
BUN: 22
CO2: 30
Calcium: 9
Creatinine, Ser: 1.13
GFR calc non Af Amer: 46 — ABNORMAL LOW
Glucose, Bld: 105 — ABNORMAL HIGH
Total Protein: 6.3

## 2011-02-21 LAB — DIFFERENTIAL
Basophils Absolute: 0.1
Basophils Relative: 1
Eosinophils Absolute: 0.1
Eosinophils Relative: 1
Lymphocytes Relative: 25
Lymphs Abs: 1.7
Monocytes Absolute: 0.6
Monocytes Relative: 9
Neutro Abs: 4.3
Neutrophils Relative %: 64

## 2011-02-21 LAB — URINALYSIS, ROUTINE W REFLEX MICROSCOPIC
Bilirubin Urine: NEGATIVE
Glucose, UA: NEGATIVE
Hgb urine dipstick: NEGATIVE
Ketones, ur: NEGATIVE
Nitrite: NEGATIVE
Protein, ur: NEGATIVE
Specific Gravity, Urine: 1.018
Urobilinogen, UA: 1
pH: 6

## 2011-02-21 LAB — CBC
HCT: 31.8 — ABNORMAL LOW
Hemoglobin: 10.9 — ABNORMAL LOW
MCHC: 34.3
MCV: 91.3
Platelets: 186
RBC: 3.48 — ABNORMAL LOW
RDW: 14.3 — ABNORMAL HIGH
WBC: 6.7

## 2011-02-21 LAB — URINE MICROSCOPIC-ADD ON

## 2011-02-21 MED ORDER — DOXYCYCLINE HYCLATE 100 MG PO TABS: 100.0000 mg | ORAL_TABLET | Freq: Two times a day (BID) | ORAL | Status: AC

## 2011-02-21 NOTE — Patient Instructions (Signed)
Doxycyline 100mg  Twice daily  For 7 days  Increase Prednisone 20mg  daily for 1 week then back to 10 mg daily   Mucinex DM Twice daily  As needed  Cough/congestion  Fluids and rest  follow up 6 weeks and As needed   Please contact office for sooner follow up if symptoms do not improve or worsen or seek emergency care

## 2011-02-21 NOTE — Assessment & Plan Note (Signed)
Slow to resolve exacerbation  Plan:   Doxycyline 100mg  Twice daily  For 7 days  Increase Prednisone 20mg  daily for 1 week then back to 10 mg daily   Mucinex DM Twice daily  As needed  Cough/congestion  Fluids and rest  follow up 6 weeks and As needed   Please contact office for sooner follow up if symptoms do not improve or worsen or seek emergency care

## 2011-02-21 NOTE — Progress Notes (Signed)
Subjective:    Patient ID: [name], female    DOB: 17-Sep-1923, 77 y.o.   MRN: [id number]  HPI  39 yowf quit smoking 2002 with onset doe and a mild chronic cough. She subsequently gained 15 pounds and beginning about the middle of 2006 began to have exacerbations with coughing wheezing and shortness of breath which required short course of prednisone to get her back to baseline but requiring Prednisone daily since 3/09.   December 22, 2007 ov no decrease in ex tol albeit on oxygen at 24 hours a day and maintaining prednisone at 10 mg daily with gradual improvement in dyspnea.   March 16, 2010  c/o sharp pain substernally radiating back along lower rib cage, c/o dyspnea , no wheeze, no signs of cold - Not taking spiriva  Steroid dependent, takes pulmicort & duonebs  Told at [facility] that she has clogged arteries causing abdominal pain  EKG - nSR with RBBB& left bifascicular block - q waves in V1-2 related to BBB ? vs old MI   April 03, 2010 --Presents for follow up. Had fall 3 weeks ago. She lost her balance in closet 10/29 and fell forward. Had bilateral sharp rib pain w/ radiation into back. CXR w/ no acute changes. Seen at urgent care 2 weeks go, tx w/ advil. She is no better. Has sudden sharp pain in ribs bialterally w/ pain into back-catching pain w/ sudden movement. Denies chest pain, dyspnea, orthopnea, hemoptysis, fever, n/v/d, edema, headache. Her to breathe in at times. dx T 9 fx > Deveshwar eval, no rx.   April 20, 2010 ov cc sob x 3 weeks, sob at rest, no unusual cough/ congestion, does ok sleeping . Increased pred back to 20 mg per day with no benefit. no purulent sputum. back better, no leg c/ow just gen fatigue. Pt denies any significant sore throat, nasal congestion or excess secretions, fever, chills, sweats, unintended wt loss, pleuritic or exertional cp, orthopnea pnd or leg swelling.   Jan 2012 Hosp much better and 10 days of avelox >  Then cipro and able  prednisone   08/22/10 Mid march prednisone up to 40 and another round of cipro >  Looser less yellow and pred down to 10 mg per day.  Confused with meds/ instructions and maint vs prn.   >>rx perforomoist and spiriva   09/19/10 Acute OV Complains of prod cough with yellow mucus (clearing), wheezing - states feels "about the same" even with doxy rx given 5.4.12.  daughter reports increased pred to 20mg  on 5.4.12. She is some better and mucus is mostly clear.Marland Kitchen She was at beach last week when cough got worse. Cant stop coughing at times. Worse at night. >>rec to finish doxyc, and steroid bump to 40mg  w/ taper to hold at 20mg    10/04/10 Follow up  2 week follow up. Last ov w/ AECOPD. She was tx with doxyc and steroid bump at 40mg  . She states wheezing and cough have improved, still having clear mucus production and weakness. Feels a lot better. Continues to have no energy.  No discolored mucus or fever. No increased swelling.  She is currently at 20mg  of prednsione daily   11/21/2010 Acute OV  Pt presents for an acute office visit. Complains of increased SOB, wheezing, prod cough with yellow/brown mucus x 4 days.  Pt is here with her daughter today. 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 She was having significant abdominal pain w/  n/v and weight loss over last several weeks -she is feeling so much better w/ improved appetite and resolution of abdominal pain. She started with cough and congestion after discharge. No fever or chest pain. No increased edema .  SHe is currently on prednisone 20mg  daily . She is now on plavix .Denies hemoptysis. >>Levaquin /steroid taper.   8/16//2012 Follow up  Returns for 1 month follow up . She has AECOPD last ov , tx w/ Levaquin and steroid taper . Says she is feeling better back to her baseline. Currently on pred 10mg  daily . No flare in cough or dyspnea. No leg swelling . No fever.  Says she is off Spiriva- messed with her eyes  in past.  >>no changes   02/21/2011 Acute OV  Complains of cough , congestion , wheezing for 2 weeks. Took Cipro 7 days and steroid burst (  left over from daughter rx ). She got better but then cough returned 3 days ago, restarted Cipro 500mg  . Still has some cough , but much better. Currently on prednisone 10mg  daily . Better but not totally over symptoms. Daughter Alona Bene) is leaving to go out of town and would like her to be checked. No hemoptysis or edema     Review of Systems  Constitutional:   No  weight loss, night sweats,  Fevers, chills, fatigue, or  lassitude.  HEENT:   No headaches,  Difficulty swallowing,  Tooth/dental problems, or  Sore throat,                No sneezing, itching, ear ache, nasal congestion, post nasal drip,   CV:  No chest pain,  Orthopnea, PND, swelling in lower extremities, anasarca, dizziness, palpitations, syncope.   GI  No heartburn, indigestion, abdominal pain, nausea, vomiting, diarrhea, change in bowel habits, loss of appetite, bloody stools.   Resp:   No coughing up of blood. Marland Kitchen  No chest wall deformity  Skin: no rash or lesions.  GU: no dysuria, change in color of urine, no urgency or frequency.  No flank pain, no hematuria   MS:  No joint pain or swelling.  No decreased range of motion.  No back pain.  Psych:  No change in mood or affect. No depression or anxiety.  No memory loss.         Objective:   Physical Exam  GEN: A/Ox3; pleasant , NAD, frail and elderly   HEENT:  [place]/AT,  EACs-clear, TMs-wnl, NOSE-clear, THROAT-clear, no lesions, no postnasal drip or exudate noted.   NECK:  Supple w/ fair ROM; no JVD; normal carotid impulses w/o bruits; no thyromegaly or nodules palpated; no lymphadenopathy.  RESP  Coarse BS w/ no wheezing.  no accessory muscle use, no dullness to percussion  CARD:  RRR, no m/r/g  , no peripheral edema, pulses intact, no cyanosis or clubbing.  GI:   Soft & nt; nml bowel sounds; no organomegaly or masses  detected.  Musco: Warm bil, no deformities or joint swelling noted.   Neuro: alert, no focal deficits noted.    Skin: Warm, no lesions or rashes         Assessment & Plan:

## 2011-02-28 ENCOUNTER — Ambulatory Visit: Payer: Medicare Other | Admitting: Adult Health

## 2011-04-10 ENCOUNTER — Ambulatory Visit: Payer: Medicare Other | Admitting: Internal Medicine

## 2011-04-30 ENCOUNTER — Telehealth: Payer: Self-pay | Admitting: Internal Medicine

## 2011-04-30 NOTE — Telephone Encounter (Signed)
I spoke with pt daughter and she states pt should received her pulmicort neb tomorrow. She states pt will miss today's dose. Pt is doing fine and not having any acute problems. I advised her if that is so then pt should be just begin the budesonide when she receives it. Also I advised her pt does have an albuterol neb and if needed she can use that. She voiced her understanding and needed nothing further

## 2011-05-01 ENCOUNTER — Ambulatory Visit: Payer: Medicare Other | Admitting: Internal Medicine

## 2011-05-10 ENCOUNTER — Encounter (HOSPITAL_COMMUNITY): Payer: Self-pay | Admitting: Emergency Medicine

## 2011-05-10 ENCOUNTER — Other Ambulatory Visit: Payer: Self-pay

## 2011-05-10 ENCOUNTER — Emergency Department (HOSPITAL_COMMUNITY): Payer: Medicare Other

## 2011-05-10 ENCOUNTER — Telehealth: Payer: Self-pay | Admitting: Internal Medicine

## 2011-05-10 ENCOUNTER — Other Ambulatory Visit: Payer: Medicare Other

## 2011-05-10 ENCOUNTER — Inpatient Hospital Stay (HOSPITAL_COMMUNITY)
Admission: EM | Admit: 2011-05-10 | Discharge: 2011-05-18 | DRG: 152 | Disposition: A | Discharge status: Home Health | Payer: Medicare Other | Attending: Internal Medicine | Admitting: Internal Medicine

## 2011-05-10 DIAGNOSIS — J111 Influenza due to unidentified influenza virus with other respiratory manifestations: Principal | ICD-10-CM | POA: Diagnosis present

## 2011-05-10 DIAGNOSIS — G9340 Encephalopathy, unspecified: Secondary | ICD-10-CM | POA: Diagnosis present

## 2011-05-10 DIAGNOSIS — R509 Fever, unspecified: Secondary | ICD-10-CM

## 2011-05-10 DIAGNOSIS — I10 Essential (primary) hypertension: Secondary | ICD-10-CM | POA: Diagnosis present

## 2011-05-10 DIAGNOSIS — J4489 Other specified chronic obstructive pulmonary disease: Secondary | ICD-10-CM | POA: Insufficient documentation

## 2011-05-10 DIAGNOSIS — E876 Hypokalemia: Secondary | ICD-10-CM

## 2011-05-10 DIAGNOSIS — Z880 Allergy status to penicillin: Secondary | ICD-10-CM

## 2011-05-10 DIAGNOSIS — Z794 Long term (current) use of insulin: Secondary | ICD-10-CM

## 2011-05-10 DIAGNOSIS — E669 Obesity, unspecified: Secondary | ICD-10-CM | POA: Diagnosis present

## 2011-05-10 DIAGNOSIS — Z87891 Personal history of nicotine dependence: Secondary | ICD-10-CM

## 2011-05-10 DIAGNOSIS — J101 Influenza due to other identified influenza virus with other respiratory manifestations: Secondary | ICD-10-CM | POA: Diagnosis present

## 2011-05-10 DIAGNOSIS — T380X5A Adverse effect of glucocorticoids and synthetic analogues, initial encounter: Secondary | ICD-10-CM | POA: Diagnosis not present

## 2011-05-10 DIAGNOSIS — J9600 Acute respiratory failure, unspecified whether with hypoxia or hypercapnia: Secondary | ICD-10-CM | POA: Diagnosis present

## 2011-05-10 DIAGNOSIS — E785 Hyperlipidemia, unspecified: Secondary | ICD-10-CM | POA: Diagnosis present

## 2011-05-10 DIAGNOSIS — IMO0002 Reserved for concepts with insufficient information to code with codable children: Secondary | ICD-10-CM

## 2011-05-10 DIAGNOSIS — Z79899 Other long term (current) drug therapy: Secondary | ICD-10-CM

## 2011-05-10 DIAGNOSIS — E039 Hypothyroidism, unspecified: Secondary | ICD-10-CM | POA: Diagnosis present

## 2011-05-10 DIAGNOSIS — R05 Cough: Secondary | ICD-10-CM

## 2011-05-10 DIAGNOSIS — K219 Gastro-esophageal reflux disease without esophagitis: Secondary | ICD-10-CM | POA: Diagnosis present

## 2011-05-10 DIAGNOSIS — N390 Urinary tract infection, site not specified: Secondary | ICD-10-CM | POA: Diagnosis present

## 2011-05-10 DIAGNOSIS — J441 Chronic obstructive pulmonary disease with (acute) exacerbation: Secondary | ICD-10-CM | POA: Diagnosis present

## 2011-05-10 DIAGNOSIS — M439 Deforming dorsopathy, unspecified: Secondary | ICD-10-CM | POA: Diagnosis present

## 2011-05-10 DIAGNOSIS — Z883 Allergy status to other anti-infective agents status: Secondary | ICD-10-CM

## 2011-05-10 DIAGNOSIS — R4182 Altered mental status, unspecified: Secondary | ICD-10-CM

## 2011-05-10 DIAGNOSIS — M810 Age-related osteoporosis without current pathological fracture: Secondary | ICD-10-CM | POA: Diagnosis present

## 2011-05-10 DIAGNOSIS — J209 Acute bronchitis, unspecified: Secondary | ICD-10-CM

## 2011-05-10 DIAGNOSIS — B961 Klebsiella pneumoniae [K. pneumoniae] as the cause of diseases classified elsewhere: Secondary | ICD-10-CM | POA: Diagnosis present

## 2011-05-10 DIAGNOSIS — Z7902 Long term (current) use of antithrombotics/antiplatelets: Secondary | ICD-10-CM

## 2011-05-10 DIAGNOSIS — E119 Type 2 diabetes mellitus without complications: Secondary | ICD-10-CM | POA: Diagnosis present

## 2011-05-10 DIAGNOSIS — A498 Other bacterial infections of unspecified site: Secondary | ICD-10-CM | POA: Diagnosis present

## 2011-05-10 DIAGNOSIS — J449 Chronic obstructive pulmonary disease, unspecified: Secondary | ICD-10-CM | POA: Insufficient documentation

## 2011-05-10 DIAGNOSIS — Z7982 Long term (current) use of aspirin: Secondary | ICD-10-CM

## 2011-05-10 HISTORY — DX: Nicotine dependence, unspecified, in remission: F17.201

## 2011-05-10 HISTORY — DX: Atelectasis: J98.11

## 2011-05-10 LAB — URINALYSIS, ROUTINE W REFLEX MICROSCOPIC
Leukocytes, UA: NEGATIVE
Protein, ur: NEGATIVE mg/dL
Specific Gravity, Urine: 1.012 (ref 1.005–1.030)
Urobilinogen, UA: 0.2 mg/dL (ref 0.0–1.0)

## 2011-05-10 LAB — GLUCOSE, CAPILLARY: Glucose-Capillary: 92 mg/dL (ref 70–99)

## 2011-05-10 LAB — POCT I-STAT, CHEM 8
BUN: 36 mg/dL — ABNORMAL HIGH (ref 6–23)
Calcium, Ion: 1.13 mmol/L (ref 1.12–1.32)
HCT: 41 % (ref 36.0–46.0)
Sodium: 139 mEq/L (ref 135–145)
TCO2: 40 mmol/L (ref 0–100)

## 2011-05-10 LAB — POCT I-STAT TROPONIN I: Troponin i, poc: 0.02 ng/mL (ref 0.00–0.08)

## 2011-05-10 MED ORDER — IPRATROPIUM BROMIDE 0.02 % IN SOLN
0.5000 mg | Freq: Once | RESPIRATORY_TRACT | Status: AC
  Administered 2011-05-11: 0.5 mg via RESPIRATORY_TRACT
  Filled 2011-05-10: qty 2.5

## 2011-05-10 MED ORDER — ACETAMINOPHEN 500 MG PO TABS
1000.0000 mg | ORAL_TABLET | Freq: Once | ORAL | Status: AC
  Administered 2011-05-10: 1000 mg via ORAL

## 2011-05-10 MED ORDER — ALBUTEROL SULFATE (5 MG/ML) 0.5% IN NEBU
2.5000 mg | INHALATION_SOLUTION | Freq: Once | RESPIRATORY_TRACT | Status: AC
  Administered 2011-05-11: 2.5 mg via RESPIRATORY_TRACT
  Filled 2011-05-10: qty 0.5

## 2011-05-10 MED ORDER — ACETAMINOPHEN 500 MG PO TABS
ORAL_TABLET | ORAL | Status: AC
  Administered 2011-05-10: 1000 mg via ORAL
  Filled 2011-05-10: qty 2

## 2011-05-10 MED ORDER — DOXYCYCLINE HYCLATE 100 MG PO TABS: ORAL_TABLET | ORAL | Status: DC

## 2011-05-10 NOTE — Telephone Encounter (Signed)
Per [facility]-ok[facility] to give Doxycyline 100 mg #8 take 2 today then 1 daily until gone no refills.

## 2011-05-10 NOTE — Telephone Encounter (Signed)
I spoke with daughter and she states pt c/o cough w/ yellow phlem, light wheezing, some chest tightness x couple days. NO nausea, vomiting or fever. She is requesting to have doxycyline called in to knock this out before she gets worse. Since MW is out will forward to Dr. Maple Hudson. Please advise, thanks  Allergies  Allergen Reactions  . Levaquin     Unable to sleep, pt stated that she felt crazy--only took for 3 days  . Penicillins   . Statins   . Sulfonamide Derivatives     --[facility]

## 2011-05-10 NOTE — ED Notes (Signed)
Per ems, that patient started having a cough today with no n/v/d

## 2011-05-10 NOTE — Telephone Encounter (Signed)
Pt daughter is aware of cdy recs. Rx has been sent to the pharmacy

## 2011-05-10 NOTE — ED Notes (Signed)
ZOX:[id number]<AV> Expected date:05/10/11<BR> Expected time: 8:46 PM<BR> Means of arrival:Ambulance<BR> Comments:<BR> M261 - 87yoF hard to wake earlier, fever, cough

## 2011-05-11 ENCOUNTER — Telehealth: Payer: Self-pay | Admitting: Internal Medicine

## 2011-05-11 ENCOUNTER — Other Ambulatory Visit: Payer: Self-pay

## 2011-05-11 ENCOUNTER — Encounter (HOSPITAL_COMMUNITY): Payer: Self-pay | Admitting: Internal Medicine

## 2011-05-11 DIAGNOSIS — J441 Chronic obstructive pulmonary disease with (acute) exacerbation: Secondary | ICD-10-CM | POA: Diagnosis present

## 2011-05-11 DIAGNOSIS — J209 Acute bronchitis, unspecified: Secondary | ICD-10-CM

## 2011-05-11 LAB — CBC
HCT: 33.7 % — ABNORMAL LOW (ref 36.0–46.0)
HCT: 39.1 % (ref 36.0–46.0)
Hemoglobin: 10.9 g/dL — ABNORMAL LOW (ref 12.0–15.0)
MCH: 30.8 pg (ref 26.0–34.0)
MCHC: 33.2 g/dL (ref 30.0–36.0)
MCV: 94.4 fL (ref 78.0–100.0)
MCV: 95.2 fL (ref 78.0–100.0)
Platelets: 222 10*3/uL (ref 150–400)
RBC: 3.54 MIL/uL — ABNORMAL LOW (ref 3.87–5.11)
RDW: 13.8 % (ref 11.5–15.5)
WBC: 9.7 10*3/uL (ref 4.0–10.5)

## 2011-05-11 LAB — COMPREHENSIVE METABOLIC PANEL
ALT: 13 U/L (ref 0–35)
AST: 20 U/L (ref 0–37)
Calcium: 8.5 mg/dL (ref 8.4–10.5)
Sodium: 136 mEq/L (ref 135–145)
Total Protein: 6.2 g/dL (ref 6.0–8.3)

## 2011-05-11 LAB — DIFFERENTIAL
Basophils Absolute: 0.1 10*3/uL (ref 0.0–0.1)
Basophils Relative: 1 % (ref 0–1)
Eosinophils Absolute: 0.1 10*3/uL (ref 0.0–0.7)
Eosinophils Relative: 1 % (ref 0–5)
Eosinophils Relative: 1 % (ref 0–5)
Lymphocytes Relative: 10 % — ABNORMAL LOW (ref 12–46)
Lymphs Abs: 1.2 10*3/uL (ref 0.7–4.0)
Monocytes Absolute: 0.9 10*3/uL (ref 0.1–1.0)
Monocytes Absolute: 1.1 10*3/uL — ABNORMAL HIGH (ref 0.1–1.0)
Monocytes Relative: 9 % (ref 3–12)
Neutro Abs: 7.5 10*3/uL (ref 1.7–7.7)

## 2011-05-11 LAB — GLUCOSE, CAPILLARY
Glucose-Capillary: 219 mg/dL — ABNORMAL HIGH (ref 70–99)
Glucose-Capillary: 285 mg/dL — ABNORMAL HIGH (ref 70–99)

## 2011-05-11 LAB — HEMOGLOBIN A1C
Hgb A1c MFr Bld: 8.1 % — ABNORMAL HIGH (ref <5.7)
Mean Plasma Glucose: 186 mg/dL — ABNORMAL HIGH (ref <117)

## 2011-05-11 LAB — URINE CULTURE
Colony Count: NO GROWTH
Culture: NO GROWTH

## 2011-05-11 LAB — INFLUENZA PANEL BY PCR (TYPE A & B): Influenza B By PCR: NEGATIVE

## 2011-05-11 MED ORDER — VITAMIN C 500 MG PO TABS
1000.0000 mg | ORAL_TABLET | Freq: Every day | ORAL | Status: DC
  Administered 2011-05-11 – 2011-05-18 (×8): 1000 mg via ORAL
  Filled 2011-05-11 (×8): qty 2

## 2011-05-11 MED ORDER — PREDNISONE 10 MG PO TABS
10.0000 mg | ORAL_TABLET | Freq: Every day | ORAL | Status: DC
  Administered 2011-05-11: 10 mg via ORAL
  Filled 2011-05-11 (×2): qty 1

## 2011-05-11 MED ORDER — IPRATROPIUM BROMIDE 0.02 % IN SOLN
0.5000 mg | Freq: Four times a day (QID) | RESPIRATORY_TRACT | Status: DC
  Administered 2011-05-11 – 2011-05-12 (×5): 0.5 mg via RESPIRATORY_TRACT
  Filled 2011-05-11 (×5): qty 2.5

## 2011-05-11 MED ORDER — ALBUTEROL SULFATE (5 MG/ML) 0.5% IN NEBU
2.5000 mg | INHALATION_SOLUTION | Freq: Once | RESPIRATORY_TRACT | Status: AC
  Administered 2011-05-11: 2.5 mg via RESPIRATORY_TRACT
  Filled 2011-05-11: qty 0.5

## 2011-05-11 MED ORDER — ALBUTEROL SULFATE (5 MG/ML) 0.5% IN NEBU
2.5000 mg | INHALATION_SOLUTION | Freq: Four times a day (QID) | RESPIRATORY_TRACT | Status: DC
  Administered 2011-05-11 – 2011-05-12 (×5): 2.5 mg via RESPIRATORY_TRACT
  Filled 2011-05-11 (×4): qty 0.5

## 2011-05-11 MED ORDER — AZITHROMYCIN 500 MG PO TABS
500.0000 mg | ORAL_TABLET | Freq: Every day | ORAL | Status: AC
  Administered 2011-05-11: 500 mg via ORAL
  Filled 2011-05-11: qty 1

## 2011-05-11 MED ORDER — POLYETHYLENE GLYCOL 3350 17 GM/SCOOP PO POWD: 17.0000 g | Freq: Every day | ORAL | Status: DC | PRN

## 2011-05-11 MED ORDER — VANCOMYCIN HCL IN DEXTROSE 1-5 GM/200ML-% IV SOLN
1000.0000 mg | Freq: Once | INTRAVENOUS | Status: AC
  Administered 2011-05-11: 1000 mg via INTRAVENOUS
  Filled 2011-05-11: qty 200

## 2011-05-11 MED ORDER — ACETAMINOPHEN 325 MG PO TABS
650.0000 mg | ORAL_TABLET | Freq: Four times a day (QID) | ORAL | Status: DC | PRN
  Administered 2011-05-13: 650 mg via ORAL
  Filled 2011-05-11: qty 2

## 2011-05-11 MED ORDER — ACETAMINOPHEN 650 MG RE SUPP: 650.0000 mg | Freq: Four times a day (QID) | RECTAL | Status: DC | PRN

## 2011-05-11 MED ORDER — METHYLPREDNISOLONE SODIUM SUCC 40 MG IJ SOLR
40.0000 mg | Freq: Two times a day (BID) | INTRAMUSCULAR | Status: DC
  Administered 2011-05-11 – 2011-05-12 (×2): 40 mg via INTRAVENOUS
  Filled 2011-05-11 (×3): qty 1

## 2011-05-11 MED ORDER — PANTOPRAZOLE SODIUM 40 MG PO TBEC
40.0000 mg | DELAYED_RELEASE_TABLET | Freq: Every day | ORAL | Status: DC
  Administered 2011-05-11 – 2011-05-17 (×7): 40 mg via ORAL
  Filled 2011-05-11 (×8): qty 1

## 2011-05-11 MED ORDER — SALINE SPRAY 0.65 % NA SOLN
2.0000 | Freq: Two times a day (BID) | NASAL | Status: DC | PRN
  Filled 2011-05-11: qty 44

## 2011-05-11 MED ORDER — INSULIN ASPART 100 UNIT/ML ~~LOC~~ SOLN
0.0000 [IU] | SUBCUTANEOUS | Status: DC
  Administered 2011-05-11: 8 [IU] via SUBCUTANEOUS
  Administered 2011-05-11: 5 [IU] via SUBCUTANEOUS
  Administered 2011-05-11: 8 [IU] via SUBCUTANEOUS
  Administered 2011-05-11: 5 [IU] via SUBCUTANEOUS
  Administered 2011-05-12: 11 [IU] via SUBCUTANEOUS
  Administered 2011-05-12: 5 [IU] via SUBCUTANEOUS
  Administered 2011-05-12: 11 [IU] via SUBCUTANEOUS
  Administered 2011-05-12 (×3): 5 [IU] via SUBCUTANEOUS
  Administered 2011-05-13: 15 [IU] via SUBCUTANEOUS
  Administered 2011-05-13: 3 [IU] via SUBCUTANEOUS
  Administered 2011-05-13: 8 [IU] via SUBCUTANEOUS
  Administered 2011-05-13: 11 [IU] via SUBCUTANEOUS
  Administered 2011-05-13 (×2): 8 [IU] via SUBCUTANEOUS
  Administered 2011-05-14: 5 [IU] via SUBCUTANEOUS
  Administered 2011-05-14: 11 [IU] via SUBCUTANEOUS
  Administered 2011-05-14: 8 [IU] via SUBCUTANEOUS
  Administered 2011-05-14: 15 [IU] via SUBCUTANEOUS
  Administered 2011-05-14: 8 [IU] via SUBCUTANEOUS
  Administered 2011-05-14 – 2011-05-15 (×2): 15 [IU] via SUBCUTANEOUS
  Administered 2011-05-15: 5 [IU] via SUBCUTANEOUS
  Filled 2011-05-11 (×2): qty 3

## 2011-05-11 MED ORDER — IPRATROPIUM BROMIDE 0.02 % IN SOLN
0.5000 mg | Freq: Once | RESPIRATORY_TRACT | Status: AC
  Administered 2011-05-11: 0.5 mg via RESPIRATORY_TRACT
  Filled 2011-05-11: qty 2.5

## 2011-05-11 MED ORDER — FUROSEMIDE 40 MG PO TABS
60.0000 mg | ORAL_TABLET | Freq: Every day | ORAL | Status: DC
  Administered 2011-05-11 – 2011-05-18 (×8): 60 mg via ORAL
  Filled 2011-05-11 (×8): qty 1

## 2011-05-11 MED ORDER — SERTRALINE HCL 50 MG PO TABS
50.0000 mg | ORAL_TABLET | Freq: Every day | ORAL | Status: DC
  Administered 2011-05-11 – 2011-05-18 (×8): 50 mg via ORAL
  Filled 2011-05-11 (×8): qty 1

## 2011-05-11 MED ORDER — GUAIFENESIN-DM 100-10 MG/5ML PO SYRP: 5.0000 mL | ORAL_SOLUTION | ORAL | Status: DC | PRN

## 2011-05-11 MED ORDER — CLOPIDOGREL BISULFATE 75 MG PO TABS
75.0000 mg | ORAL_TABLET | Freq: Every day | ORAL | Status: DC
  Administered 2011-05-11 – 2011-05-18 (×8): 75 mg via ORAL
  Filled 2011-05-11 (×8): qty 1

## 2011-05-11 MED ORDER — FERROUS SULFATE 325 (65 FE) MG PO TABS
325.0000 mg | ORAL_TABLET | Freq: Two times a day (BID) | ORAL | Status: DC
  Administered 2011-05-11 – 2011-05-18 (×14): 325 mg via ORAL
  Filled 2011-05-11 (×16): qty 1

## 2011-05-11 MED ORDER — DIAZEPAM 5 MG PO TABS: 2.5000 mg | ORAL_TABLET | Freq: Two times a day (BID) | ORAL | Status: DC | PRN

## 2011-05-11 MED ORDER — ONDANSETRON HCL 4 MG/2ML IJ SOLN: 4.0000 mg | Freq: Four times a day (QID) | INTRAMUSCULAR | Status: DC | PRN

## 2011-05-11 MED ORDER — VANCOMYCIN HCL IN DEXTROSE 1-5 GM/200ML-% IV SOLN
1000.0000 mg | INTRAVENOUS | Status: DC
  Administered 2011-05-12 – 2011-05-13 (×2): 1000 mg via INTRAVENOUS
  Filled 2011-05-11 (×4): qty 200

## 2011-05-11 MED ORDER — DEXTROSE 5 % IV SOLN
500.0000 mg | Freq: Once | INTRAVENOUS | Status: AC
  Administered 2011-05-11: 04:00:00 via INTRAVENOUS
  Filled 2011-05-11: qty 500

## 2011-05-11 MED ORDER — ONDANSETRON HCL 4 MG PO TABS: 4.0000 mg | ORAL_TABLET | Freq: Four times a day (QID) | ORAL | Status: DC | PRN

## 2011-05-11 MED ORDER — LEVOTHYROXINE SODIUM 88 MCG PO TABS
88.0000 ug | ORAL_TABLET | Freq: Every day | ORAL | Status: DC
  Administered 2011-05-11 – 2011-05-18 (×8): 88 ug via ORAL
  Filled 2011-05-11 (×8): qty 1

## 2011-05-11 MED ORDER — AZITHROMYCIN 250 MG PO TABS
250.0000 mg | ORAL_TABLET | Freq: Every day | ORAL | Status: AC
  Administered 2011-05-12 – 2011-05-15 (×4): 250 mg via ORAL
  Filled 2011-05-11 (×4): qty 1

## 2011-05-11 MED ORDER — SODIUM CHLORIDE 0.9 % IV SOLN
INTRAVENOUS | Status: DC
  Administered 2011-05-11 – 2011-05-15 (×5): via INTRAVENOUS

## 2011-05-11 MED ORDER — ALBUTEROL SULFATE (5 MG/ML) 0.5% IN NEBU
2.5000 mg | INHALATION_SOLUTION | RESPIRATORY_TRACT | Status: DC | PRN
  Administered 2011-05-11: 2.5 mg via RESPIRATORY_TRACT
  Filled 2011-05-11 (×5): qty 0.5

## 2011-05-11 MED ORDER — METHYLPREDNISOLONE SODIUM SUCC 40 MG IJ SOLR
30.0000 mg | Freq: Four times a day (QID) | INTRAMUSCULAR | Status: DC
  Filled 2011-05-11 (×5): qty 0.75

## 2011-05-11 MED ORDER — OSELTAMIVIR PHOSPHATE 75 MG PO CAPS
75.0000 mg | ORAL_CAPSULE | Freq: Two times a day (BID) | ORAL | Status: AC
  Administered 2011-05-12 – 2011-05-16 (×10): 75 mg via ORAL
  Filled 2011-05-11 (×10): qty 1

## 2011-05-11 MED ORDER — ALUM & MAG HYDROXIDE-SIMETH 200-200-20 MG/5ML PO SUSP: 30.0000 mL | Freq: Four times a day (QID) | ORAL | Status: DC | PRN

## 2011-05-11 MED ORDER — SENNA 8.6 MG PO TABS
2.0000 | ORAL_TABLET | Freq: Two times a day (BID) | ORAL | Status: DC
  Administered 2011-05-11 – 2011-05-13 (×6): 17.2 mg via ORAL
  Administered 2011-05-14: 8.6 mg via ORAL
  Administered 2011-05-14 – 2011-05-17 (×7): 17.2 mg via ORAL
  Filled 2011-05-11 (×2): qty 2
  Filled 2011-05-11: qty 1
  Filled 2011-05-11 (×4): qty 2
  Filled 2011-05-11 (×2): qty 1
  Filled 2011-05-11 (×5): qty 2
  Filled 2011-05-11: qty 1

## 2011-05-11 MED ORDER — ASPIRIN EC 81 MG PO TBEC
81.0000 mg | DELAYED_RELEASE_TABLET | Freq: Every day | ORAL | Status: DC
  Administered 2011-05-11 – 2011-05-18 (×8): 81 mg via ORAL
  Filled 2011-05-11 (×8): qty 1

## 2011-05-11 NOTE — Progress Notes (Signed)
ANTIBIOTIC CONSULT NOTE - INITIAL  Pharmacy Consult for Vancomycin Indication: presumed PNA   Allergies  Allergen Reactions  . Levaquin     Unable to sleep, pt stated that she felt crazy--only took for 3 days  . Penicillins   . Statins   . Sulfonamide Derivatives     Patient Measurements: Height: 5\' 1"  (154.9 cm) Weight: 141 lb (63.957 kg) IBW/kg (Calculated) : 47.8 kg  Adjusted Body Weight: 54.3 kg   Vital Signs: Temp: 98.6 F (37 C) (12/28 0630) Temp src: Oral (12/28 0630) BP: 113/63 mmHg (12/28 0630) Pulse Rate: 90  (12/28 0630) Intake/Output from previous day:   Intake/Output from this shift:    Labs:  Basename 05/11/11 0720 05/10/11 2347 05/10/11 2331  WBC 10.1 -- 9.7  HGB 10.9* 13.9 13.0  PLT 200 -- 222  LABCREA -- -- --  CREATININE 1.18* 1.10 --   Estimated Creatinine Clearance: 29.9 ml/min (by C-G formula based on Cr of 1.18). No results found for this basename: VANCOTROUGH:2,VANCOPEAK:2,VANCORANDOM:2,GENTTROUGH:2,GENTPEAK:2,GENTRANDOM:2,TOBRATROUGH:2,TOBRAPEAK:2,TOBRARND:2,AMIKACINPEAK:2,AMIKACINTROU:2,AMIKACIN:2, in the last 72 hours   Microbiology: No results found for this or any previous visit (from the past 720 hour(s)).  Medical History: Past Medical History  Diagnosis Date  . Osteoporosis, unspecified   . Other abnormal glucose   . Other and unspecified hyperlipidemia   . Esophageal reflux   . Chronic airway obstruction, not elsewhere classified     On home O2 and daily Prednisone  . Unspecified hypothyroidism     Medications:  Scheduled:    . acetaminophen  1,000 mg Oral Once  . albuterol  2.5 mg Nebulization Once  . albuterol  2.5 mg Nebulization Once  . albuterol  2.5 mg Nebulization Q6H  . aspirin EC  81 mg Oral Daily  . azithromycin  500 mg Intravenous Once  . azithromycin  500 mg Oral Daily   Followed by  . azithromycin  250 mg Oral Daily  . clopidogrel  75 mg Oral Daily  . ferrous sulfate  325 mg Oral BID  . furosemide  60  mg Oral Daily  . insulin aspart  0-15 Units Subcutaneous Q4H  . ipratropium  0.5 mg Nebulization Once  . ipratropium  0.5 mg Nebulization Once  . ipratropium  0.5 mg Nebulization Q6H  . levothyroxine  88 mcg Oral Daily  . pantoprazole  40 mg Oral Q1200  . predniSONE  10 mg Oral Q breakfast  . senna  2 tablet Oral BID  . sertraline  50 mg Oral Daily  . vancomycin  1,000 mg Intravenous Once  . vitamin C  1,000 mg Oral Daily  . DISCONTD: methylPREDNISolone (SOLU-MEDROL) injection  30 mg Intravenous Q6H   Infusions:    . sodium chloride 75 mL/hr at 05/11/11 0748   Assessment:  89 YOF with h/o COPD admitted 12/27 with fever, cough, AMS r/o for pneumonia, influenza, COPD exacerbation.  Patient also on azithromycin  Vancomycin 1gm IV x 1 sent to the ED and given at 0800 this AM.   Normalized CrCl 39 ml/min, CG 30 ml/min  Pending influenza panel, blood cultures, urine cx  Goal of Therapy:  Vancomycin trough level 15-20 mcg/ml  Plan:   Vancomycin 1gm IV q24h, next dose due 12/29 AM at 0800.  Follow up cultures and renal fxn. Vanc trough at steady state if continues given age of 20  Geoffry Paradise Thi 05/11/2011,8:34 AM

## 2011-05-11 NOTE — Progress Notes (Signed)
RN Called MD and notified him of pt's location, states will see pt after lunch.

## 2011-05-11 NOTE — H&P (Addendum)
PCP:   Pearla Dubonnet, MD, MD   Chief Complaint:  Altered mental status, cough, fever  HPI: 87yoF with h/o COPD on home O2 and daily Prednisone presents with likely viral respiratory  infection vs atypical PNA causing likely COPD exacerbation.   Pt with increasing cough productive of yellowish sputum through today, increasing lethargy.  Family went to wake her up for dinner and found her very altered, not making sense, not  answering questions. Denies any frank fevers at home but was not measured, denies any muscle  aches. No GI issues of n/v/d/abd pain.  In the ED, pt was febrile to 103.5, other vitals stable. Chem panel with HCO3 of  36-40, Cl 95, renal 36/1.1, CBC normal, UA negative, BCx x2 and UCx pending. Trop  POC negative. CXR with right base scar vs atelectasis, and bronchitis changes. Pt was noted to  be in moderate respiratory distress despite 3 nebs, increased WOB, but has slowly recovered and  is currently looking much better. She was noted to have allergy to PCN and Levaquin, and so was  given Azithromycin  ROS negative otherwise  Past Medical History  Diagnosis Date  . Osteoporosis, unspecified   . Other abnormal glucose   . Other and unspecified hyperlipidemia   . Esophageal reflux   . Chronic airway obstruction, not elsewhere classified     On home O2 and daily Prednisone  . Unspecified hypothyroidism     Past Surgical History  Procedure Date  . Cholecystectomy   . Ankle fracture surgery     left 10/12/2008    Medications: HOME MEDS: Prior to Admission medications   Medication Sig Start Date End Date Taking? Authorizing Provider  albuterol (PROVENTIL HFA) 108 (90 BASE) MCG/ACT inhaler Inhale 2 puffs into the lungs every 4 (four) hours as needed for wheezing. 10/12/10  Yes Tammy Parrett, NP  aspirin 81 MG tablet Take 81 mg by mouth daily.  10/12/10  Yes Tammy Parrett, NP  budesonide (PULMICORT) 0.25 MG/2ML nebulizer solution Take 0.25 mg by  nebulization daily.     Yes Historical Provider, MD  calcium citrate-vitamin D (CITRACAL+D) 315-200 MG-UNIT per tablet Take 2 tablets by mouth daily.     Yes Historical Provider, MD  Cholecalciferol (VITAMIN D3) 1000 UNITS tablet Take 2,000 Units by mouth 2 (two) times daily.  10/12/10  Yes Tammy Parrett, NP  clopidogrel (PLAVIX) 75 MG tablet Take 75 mg by mouth daily.     Yes Historical Provider, MD  diazepam (VALIUM) 5 MG tablet Take 2.5 mg by mouth 2 (two) times daily as needed.  10/12/10  Yes Tammy Parrett, NP  doxycycline (VIBRA-TABS) 100 MG tablet Take 2 today then 1 daily until gone 05/10/11 05/09/12 Yes Clinton D Young, MD  ferrous sulfate 325 (65 FE) MG EC tablet Take 1 tablet (325 mg total) by mouth 2 (two) times daily. 10/12/10  Yes Tammy Parrett, NP  furosemide (LASIX) 40 MG tablet Take 60 mg by mouth daily. Take 1 and 1/2 tabs by mouth once daily 10/12/10  Yes Tammy Parrett, NP  insulin aspart (NOVOLOG) 100 UNIT/ML injection Inject 5-10 Units into the skin 3 (three) times daily before meals. Sliding scale noon meal and 10 units at dinner as directed   Yes Historical Provider, MD  insulin lispro protamine-insulin lispro (HUMALOG 75/25) (75-25) 100 UNIT/ML SUSP Inject 8-28 Units into the skin 2 (two) times daily with a meal. 25-28 units Every morning and 8 units every evening 10/12/10  Yes Tammy Parrett, NP  ipratropium-albuterol (  DUONEB) 0.5-2.5 (3) MG/3ML SOLN Take 3 mLs by nebulization every 6 (six) hours as needed. Shortness of breath    Yes Historical Provider, MD  levothyroxine (SYNTHROID, LEVOTHROID) 88 MCG tablet Take 88 mcg by mouth daily.     Yes Historical Provider, MD  Magnesium 250 MG TABS Take 1 tablet by mouth daily.     Yes Historical Provider, MD  omeprazole (PRILOSEC) 20 MG capsule Take 1 capsule by mouth 2 (two) times daily before a meal. 10/18/10  Yes Historical Provider, MD  polyethylene glycol powder (MIRALAX) powder Take 17 g by mouth daily as needed.     Yes Historical  Provider, MD  Potassium Gluconate 595 MG CAPS 1 tablet by mouth once daily   Yes Historical Provider, MD  predniSONE (DELTASONE) 10 MG tablet Take 1 tab by mouth once daily or increase to 2 tab until back to baseline when needed for increased shortness of breath 10/12/10  Yes Tammy Parrett, NP  senna (SENOKOT) 8.6 MG tablet Take 2 tablets by mouth 2 (two) times daily.    Yes Historical Provider, MD  sertraline (ZOLOFT) 50 MG tablet Take 50 mg by mouth daily.    Yes Historical Provider, MD  sodium chloride (OCEAN) 0.65 % nasal spray Place 2 sprays into the nose 2 (two) times daily as needed for congestion. 10/12/10  Yes Tammy Parrett, NP  tiotropium (SPIRIVA) 18 MCG inhalation capsule Place 18 mcg into inhaler and inhale daily.     Yes Historical Provider, MD  vitamin C (ASCORBIC ACID) 500 MG tablet Take 1,000 mg by mouth daily.     Yes Historical Provider, MD    Allergies:  Allergies  Allergen Reactions  . Levaquin     Unable to sleep, pt stated that she felt crazy--only took for 3 days  . Penicillins   . Statins   . Sulfonamide Derivatives     Social History:   reports that she quit smoking about 10 years ago. Her smoking use included Cigarettes. She has a 75 pack-year smoking history. She does not have any smokeless tobacco history on file. Her alcohol and drug histories not on file.  Family History: Family History  Problem Relation Age of Onset  . Emphysema Brother     multiple  . Emphysema Father     Physical Exam: Filed Vitals:   05/11/11 0005 05/11/11 0432 05/11/11 0529 05/11/11 0618  BP:   128/54 126/54  Pulse: 95 110 90 82  Temp:    98.6 F (37 C)  TempSrc:    Oral  Resp: 26 28  23   SpO2: 96% 84% 96% 95%   Blood pressure 126/54, pulse 82, temperature 98.6 F (37 C), temperature source Oral, resp. rate 23, SpO2 95.00%. Gen: Elderly appearing F with venturi mask on, currently appears much improved compared to  reported prior, with some minimal increase in WOB. Can  speak in short sentences but not  extensively HEENT: Pupils round, sclera clear, normal appearing. Mouth deferred Lungs: Poor air movement with end expiratory coarse breath sounds and high pitched squeakey  wheezes as well. She has occasional junky sounding cough  Heart: Very difficult to appreciate S1/2  Abd: Distended and round, but not tight, tender, rigid; family states this is chronic Extrem: Warm, perfusing normally. No BLE edema noted  Neuro: Alert, attentive but fatigued appearing, following conversation and speaking. Grossly  non-focal but full exam deferred at present    Labs & Imaging Results for orders placed during the hospital encounter of  05/10/11 (from the past 48 hour(s))  GLUCOSE, CAPILLARY     Status: Normal   Collection Time   05/10/11  9:28 PM      Component Value Range Comment   Glucose-Capillary 92  70 - 99 (mg/dL)   URINALYSIS, ROUTINE W REFLEX MICROSCOPIC     Status: Normal   Collection Time   05/10/11  9:39 PM      Component Value Range Comment   Color, Urine YELLOW  YELLOW     APPearance CLEAR  CLEAR     Specific Gravity, Urine 1.012  1.005 - 1.030     pH 6.0  5.0 - 8.0     Glucose, UA NEGATIVE  NEGATIVE (mg/dL)    Hgb urine dipstick NEGATIVE  NEGATIVE     Bilirubin Urine NEGATIVE  NEGATIVE     Ketones, ur NEGATIVE  NEGATIVE (mg/dL)    Protein, ur NEGATIVE  NEGATIVE (mg/dL)    Urobilinogen, UA 0.2  0.0 - 1.0 (mg/dL)    Nitrite NEGATIVE  NEGATIVE     Leukocytes, UA NEGATIVE  NEGATIVE  MICROSCOPIC NOT DONE ON URINES WITH NEGATIVE PROTEIN, BLOOD, LEUKOCYTES, NITRITE, OR GLUCOSE <1000 mg/dL.  CBC     Status: Normal   Collection Time   05/10/11 11:31 PM      Component Value Range Comment   WBC 9.7  4.0 - 10.5 (K/uL)    RBC 4.14  3.87 - 5.11 (MIL/uL)    Hemoglobin 13.0  12.0 - 15.0 (g/dL)    HCT 16.1  09.6 - 04.5 (%)    MCV 94.4  78.0 - 100.0 (fL)    MCH 31.4  26.0 - 34.0 (pg)    MCHC 33.2  30.0 - 36.0 (g/dL)    RDW 40.9  81.1 - 91.4 (%)    Platelets  222  150 - 400 (K/uL)   DIFFERENTIAL     Status: Abnormal   Collection Time   05/10/11 11:31 PM      Component Value Range Comment   Neutrophils Relative 77  43 - 77 (%)    Neutro Abs 7.5  1.7 - 7.7 (K/uL)    Lymphocytes Relative 10 (*) 12 - 46 (%)    Lymphs Abs 1.0  0.7 - 4.0 (K/uL)    Monocytes Relative 11  3 - 12 (%)    Monocytes Absolute 1.1 (*) 0.1 - 1.0 (K/uL)    Eosinophils Relative 1  0 - 5 (%)    Eosinophils Absolute 0.1  0.0 - 0.7 (K/uL)    Basophils Relative 1  0 - 1 (%)    Basophils Absolute 0.1  0.0 - 0.1 (K/uL)   POCT I-STAT TROPONIN I     Status: Normal   Collection Time   05/10/11 11:46 PM      Component Value Range Comment   Troponin i, poc 0.02  0.00 - 0.08 (ng/mL)    Comment 3            POCT I-STAT, CHEM 8     Status: Abnormal   Collection Time   05/10/11 11:47 PM      Component Value Range Comment   Sodium 139  135 - 145 (mEq/L)    Potassium 5.1  3.5 - 5.1 (mEq/L)    Chloride 95 (*) 96 - 112 (mEq/L)    BUN 36 (*) 6 - 23 (mg/dL)    Creatinine, Ser 7.82  0.50 - 1.10 (mg/dL)    Glucose, Bld 956 (*) 70 - 99 (mg/dL)  Calcium, Ion 1.13  1.12 - 1.32 (mmol/L)    TCO2 40  0 - 100 (mmol/L)    Hemoglobin 13.9  12.0 - 15.0 (g/dL)    HCT 28.4  13.2 - 44.0 (%)    Dg Chest 2 View  05/11/2011  *RADIOLOGY REPORT*  Clinical Data: Cough  CHEST - 2 VIEW  Comparison: 08/22/2010  Findings: Moderate cardiac enlargement.  No pleural effusion identified.  Scar and/or linear atelectasis is noted in the right base.  No airspace consolidation identified.  Chronic bronchitic changes are noted bilaterally.  The bones are diffusely osteopenic and there are multilevel compression deformities noted throughout the thoracic spine. Unchanged since the previous study.  Advanced osteoarthritis involves the right glenohumeral joint.  IMPRESSION: 1. Right base scar and/or atelectasis. 2.  Bronchitic changes.  Original Report Authenticated By: Rosealee Albee, M.D.    Impression 87yoF with  h/o COPD on home O2 and daily Prednisone presents with likely viral respiratory  infection vs atypical PNA causing likely COPD exacerbation.   1. COPD exacerbation and respiratory infection: Pt with likely poor baseline given home O2 use  and daily Predisone, now with productive cough although CXR without focal consolidation. Will  treat as COPD exacerbation, presumed PNA, and test for influenza. She has PCN allergy, so will  treat broadly for Vancomycin for gram positives and azithromycin for atypicals. Increase home  10 mg Prednisone to 40 mg.   - Vanc/Azithro, influenza swab, hold on Tamiflu for now, scheduled nebs, Prednisone 40 mg  daily, guaifenesin - SSI while on Prednisone   2. Med rec: continue home plavix, ASA 81, lasix.   Regular bed, will admit to Dr. Kevan Ny. I have called and left a message to their practice Full code, discussed with pt and son   Other plans as per orders.  Ytzel Gubler 05/11/2011, 6:39 AM

## 2011-05-11 NOTE — ED Notes (Signed)
Attempted to call report x 2 but nurse unable to take

## 2011-05-11 NOTE — Progress Notes (Signed)
Inpatient Diabetes Program Recommendations  AACE/ADA: New Consensus Statement on Inpatient Glycemic Control (2009)  Target Ranges:  Prepandial:   less than 140 mg/dL      Peak postprandial:   less than 180 mg/dL (1-2 hours)      Critically ill patients:  140 - 180 mg/dL   Reason for Visit: CBGs greater than 180 mg/dl  Inpatient Diabetes Program Recommendations Insulin - Basal: Start Lantus 20 to 25 units daily if CBGs continue greater than 180 mg/dl.  Takes 75/25 mix insulin at home. HgbA1C: Check HgbA1C for home glucose control  Note:

## 2011-05-11 NOTE — ED Notes (Signed)
Patient is still tachypneic with rate 36.  But holding sats 95-96%

## 2011-05-11 NOTE — Progress Notes (Signed)
Patient's family have been awaiting visit from primary care doctor, doctor made aware of family concern via telephone by Esther,RN this am and this afternoon.   Patient's family very concerned that she has not been seen this am since Dr. Angus Palms at approximately 0630  and wants to see her doctor.  I phoned office spoke with doctor's nurse Clydie Braun expressing the family's concerns and Dr. Kevan Ny placed on the phone.  Explained the situation, he states would not be seeing patient today but in the am, I verbalized family's concerns again and was instructed to place family on phone and that he would see patient in the am.  The patient's daughter came to phone, spoke with doctor, hung up phone and very upset that doctor not coming to see her mother until tomorrow.

## 2011-05-11 NOTE — ED Notes (Signed)
Einar Pheasant PA in to see patient for admission

## 2011-05-11 NOTE — Progress Notes (Signed)
Patient's daughter asked to speak to charge nurse regarding her concerns over her mother's condition and not being seen by her doctor today.  I listened to her concerns and phoned the office to speak to doctor on call, which at same time received call from Dr. Kevan Ny.  Chart reviewed, patient information shared, orders given.  Informed the patient and her daughter of this and that Dr. Kevan Ny would be phoning her shortly in her room.

## 2011-05-11 NOTE — ED Notes (Signed)
O2 level dropping into the 70's and low 80's  [place] increased to bring sats up respiratory called to place patient on a venturi mask.  @ 35 %

## 2011-05-11 NOTE — Telephone Encounter (Signed)
[place]

## 2011-05-11 NOTE — Progress Notes (Signed)
Pt's family very upset about the fact that [name] has not been seen by MD. RN paged and spoke with DR. Gates about the situation, he states may not be able to see pt tonight, but pt could be seen by hospitallist MD. The  Nurse Care Coordinator was told to assist with the angry pt's family.

## 2011-05-11 NOTE — ED Provider Notes (Signed)
History     CSN: [id number]  Arrival date & time 05/10/11  2059   First MD Initiated Contact with Patient 05/10/11 2329      Chief Complaint  Patient presents with  . Cough    (Consider location/radiation/quality/duration/timing/severity/associated sxs/prior treatment) Patient is a 77 y.o. female presenting with cough. The history is provided by a relative. The history is limited by the condition of the patient. No language interpreter was used.  Cough This is a new problem. The current episode started 6 to 12 hours ago. The problem occurs constantly. The problem has been gradually worsening. The cough is non-productive. The maximum temperature recorded prior to her arrival was 103 to 104 F. The fever has been present for less than 1 day. She has tried nothing for the symptoms. She is not a smoker. Her past medical history is significant for COPD.    Past Medical History  Diagnosis Date  . Osteoporosis, unspecified   . Other abnormal glucose   . Other and unspecified hyperlipidemia   . Esophageal reflux   . Chronic airway obstruction, not elsewhere classified   . Unspecified hypothyroidism     Past Surgical History  Procedure Date  . Cholecystectomy   . Ankle fracture surgery     left 10/12/2008    Family History  Problem Relation Age of Onset  . Emphysema Brother     multiple  . Emphysema Father     History  Substance Use Topics  . Smoking status: Former Smoker -- 1.5 packs/day for 50 years    Types: Cigarettes    Quit date: 05/14/2000  . Smokeless tobacco: Not on file  . Alcohol Use: Not on file    OB History    Grav Para Term Preterm Abortions TAB SAB Ect Mult Living                  Review of Systems  Unable to perform ROS Respiratory: Positive for cough.     Allergies  Levaquin; Penicillins; Statins; and Sulfonamide derivatives  Home Medications   Current Outpatient Rx  Name Route Sig Dispense Refill  . ALBUTEROL SULFATE HFA 108 (90 BASE)  MCG/ACT IN AERS Inhalation Inhale 2 puffs into the lungs every 4 (four) hours as needed for wheezing.    . ASPIRIN 81 MG PO TABS Oral Take 81 mg by mouth daily.     . BUDESONIDE 0.25 MG/2ML IN SUSP Nebulization Take 0.25 mg by nebulization daily.      Marland Kitchen CALCIUM CITRATE-VITAMIN D 315-200 MG-UNIT PO TABS Oral Take 2 tablets by mouth daily.      Marland Kitchen VITAMIN D3 1000 UNITS PO TABS Oral Take 2,000 Units by mouth 2 (two) times daily.     Marland Kitchen CLOPIDOGREL BISULFATE 75 MG PO TABS Oral Take 75 mg by mouth daily.      Marland Kitchen DIAZEPAM 5 MG PO TABS Oral Take 2.5 mg by mouth 2 (two) times daily as needed.     Marland Kitchen DOXYCYCLINE HYCLATE 100 MG PO TABS  Take 2 today then 1 daily until gone 8 tablet 0  . FERROUS SULFATE 325 (65 FE) MG PO TBEC Oral Take 1 tablet (325 mg total) by mouth 2 (two) times daily.    . FUROSEMIDE 40 MG PO TABS Oral Take 60 mg by mouth daily. Take 1 and 1/2 tabs by mouth once daily    . INSULIN ASPART 100 UNIT/ML [place] SOLN Subcutaneous Inject 5-10 Units into the skin 3 (three) times daily before meals.  Sliding scale noon meal and 10 units at dinner as directed    . INSULIN LISPRO PROT & LISPRO (75-25) 100 UNIT/ML [place] SUSP Subcutaneous Inject 8-28 Units into the skin 2 (two) times daily with a meal. 25-28 units Every morning and 8 units every evening    . IPRATROPIUM-ALBUTEROL 0.5-2.5 (3) MG/3ML IN SOLN Nebulization Take 3 mLs by nebulization every 6 (six) hours as needed. Shortness of breath     . LEVOTHYROXINE SODIUM 88 MCG PO TABS Oral Take 88 mcg by mouth daily.      Marland Kitchen MAGNESIUM 250 MG PO TABS Oral Take 1 tablet by mouth daily.      Marland Kitchen OMEPRAZOLE 20 MG PO CPDR Oral Take 1 capsule by mouth 2 (two) times daily before a meal.    . POLYETHYLENE GLYCOL 3350 PO POWD Oral Take 17 g by mouth daily as needed.      Marland Kitchen POTASSIUM GLUCONATE 595 MG PO CAPS  1 tablet by mouth once daily    . PREDNISONE 10 MG PO TABS  Take 1 tab by mouth once daily or increase to 2 tab until back to baseline when needed for increased  shortness of breath    . SENNOSIDES 8.6 MG PO TABS Oral Take 2 tablets by mouth 2 (two) times daily.     . SERTRALINE HCL 50 MG PO TABS Oral Take 50 mg by mouth daily.     Marland Kitchen SALINE NASAL SPRAY 0.65 % NA SOLN Nasal Place 2 sprays into the nose 2 (two) times daily as needed for congestion.    Marland Kitchen TIOTROPIUM BROMIDE MONOHYDRATE 18 MCG IN CAPS Inhalation Place 18 mcg into inhaler and inhale daily.      Marland Kitchen VITAMIN C 500 MG PO TABS Oral Take 1,000 mg by mouth daily.        BP 135/60  Pulse 95  Temp(Src) 99.1 F (37.3 C) (Oral)  Resp 26  SpO2 96%  Physical Exam  Constitutional: No distress.  HENT:  Head: Normocephalic and atraumatic.  Right Ear: External ear normal.  Left Ear: External ear normal.  Mouth/Throat: Oropharynx is clear and moist.  Eyes: Conjunctivae are normal. Pupils are equal, round, and reactive to light.  Neck: Normal range of motion. Neck supple.  Cardiovascular: Normal rate and regular rhythm.   Pulmonary/Chest: No stridor. No respiratory distress. She has wheezes. She exhibits no tenderness.  Abdominal: Soft. Bowel sounds are normal. There is no tenderness. There is no rebound and no guarding.  Musculoskeletal: Normal range of motion. She exhibits no edema.  Neurological: She is alert.  Skin: Skin is warm and dry.  Psychiatric: She has a normal mood and affect.    ED Course  Procedures (including critical care time)  Labs Reviewed  POCT I-STAT, CHEM 8 - Abnormal; Notable for the following:    Chloride 95 (*)    BUN 36 (*)    Glucose, Bld 102 (*)    All other components within normal limits  URINALYSIS, ROUTINE W REFLEX MICROSCOPIC  GLUCOSE, CAPILLARY  POCT I-STAT TROPONIN I  URINE CULTURE  POCT CBG MONITORING  CBC  DIFFERENTIAL  I-STAT TROPONIN I  I-STAT, CHEM 8   Dg Chest 2 View  05/11/2011  *RADIOLOGY REPORT*  Clinical Data: Cough  CHEST - 2 VIEW  Comparison: 08/22/2010  Findings: Moderate cardiac enlargement.  No pleural effusion identified.  Scar  and/or linear atelectasis is noted in the right base.  No airspace consolidation identified.  Chronic bronchitic changes are noted bilaterally.  The bones are diffusely osteopenic and there are multilevel compression deformities noted throughout the thoracic spine. Unchanged since the previous study.  Advanced osteoarthritis involves the right glenohumeral joint.  IMPRESSION: 1. Right base scar and/or atelectasis. 2.  Bronchitic changes.  Original Report Authenticated By: Rosealee Albee, M.D.     No diagnosis found.    MDM   Date: 05/11/2011  Rate: 93  Rhythm: normal sinus rhythm  QRS Axis: normal  Intervals: normal  ST/T Wave abnormalities: nonspecific ST changes  Conduction Disutrbances:none  Narrative Interpretation: rbbb  Old EKG Reviewed: none available         Lindbergh Winkles K Ica Daye-Rasch, MD 05/11/11 [phone]

## 2011-05-11 NOTE — Telephone Encounter (Signed)
I spoke with daughter and she states that pt was admitted to [facility] last night and is room 1519. She states they have r/o PNA and stroke. Daughter is wanting to make MW aware of this. Will forward to him as an [place]

## 2011-05-12 ENCOUNTER — Encounter (HOSPITAL_COMMUNITY): Payer: Self-pay | Admitting: Internal Medicine

## 2011-05-12 DIAGNOSIS — J441 Chronic obstructive pulmonary disease with (acute) exacerbation: Secondary | ICD-10-CM

## 2011-05-12 DIAGNOSIS — G9340 Encephalopathy, unspecified: Secondary | ICD-10-CM

## 2011-05-12 DIAGNOSIS — J449 Chronic obstructive pulmonary disease, unspecified: Secondary | ICD-10-CM

## 2011-05-12 DIAGNOSIS — J111 Influenza due to unidentified influenza virus with other respiratory manifestations: Secondary | ICD-10-CM

## 2011-05-12 DIAGNOSIS — J9600 Acute respiratory failure, unspecified whether with hypoxia or hypercapnia: Secondary | ICD-10-CM

## 2011-05-12 DIAGNOSIS — I10 Essential (primary) hypertension: Secondary | ICD-10-CM | POA: Diagnosis present

## 2011-05-12 DIAGNOSIS — J101 Influenza due to other identified influenza virus with other respiratory manifestations: Secondary | ICD-10-CM | POA: Diagnosis present

## 2011-05-12 LAB — GLUCOSE, CAPILLARY
Glucose-Capillary: 228 mg/dL — ABNORMAL HIGH (ref 70–99)
Glucose-Capillary: 344 mg/dL — ABNORMAL HIGH (ref 70–99)
Glucose-Capillary: 297 mg/dL — ABNORMAL HIGH (ref 70–99)
Glucose-Capillary: 294 mg/dL — ABNORMAL HIGH (ref 70–99)

## 2011-05-12 LAB — DIFFERENTIAL
Basophils Relative: 1 % (ref 0–1)
Lymphocytes Relative: 7 % — ABNORMAL LOW (ref 12–46)
Lymphs Abs: 0.5 10*3/uL — ABNORMAL LOW (ref 0.7–4.0)
Monocytes Relative: 3 % (ref 3–12)
Neutro Abs: 6.3 10*3/uL (ref 1.7–7.7)
Neutrophils Relative %: 89 % — ABNORMAL HIGH (ref 43–77)

## 2011-05-12 LAB — CBC
Hemoglobin: 10.5 g/dL — ABNORMAL LOW (ref 12.0–15.0)
MCHC: 34 g/dL (ref 30.0–36.0)
RBC: 3.37 MIL/uL — ABNORMAL LOW (ref 3.87–5.11)
WBC: 7.1 10*3/uL (ref 4.0–10.5)

## 2011-05-12 LAB — BASIC METABOLIC PANEL
GFR calc Af Amer: 89 mL/min — ABNORMAL LOW (ref >90)
GFR calc non Af Amer: 77 mL/min — ABNORMAL LOW (ref >90)
Potassium: 3.8 mEq/L (ref 3.5–5.1)
Sodium: 139 mEq/L (ref 135–145)

## 2011-05-12 LAB — BLOOD GAS, ARTERIAL
Acid-Base Excess: 7.7 mmol/L — ABNORMAL HIGH (ref 0.0–2.0)
Delivery systems: POSITIVE
Inspiratory PAP: 10
Mode: POSITIVE
Patient temperature: 98.6
TCO2: 29.7 mmol/L (ref 0–100)

## 2011-05-12 LAB — PRO B NATRIURETIC PEPTIDE: Pro B Natriuretic peptide (BNP): 770.1 pg/mL — ABNORMAL HIGH (ref 0–450)

## 2011-05-12 LAB — LACTIC ACID, PLASMA: Lactic Acid, Venous: 1.8 mmol/L (ref 0.5–2.2)

## 2011-05-12 LAB — CARDIAC PANEL(CRET KIN+CKTOT+MB+TROPI)
CK, MB: 5 ng/mL — ABNORMAL HIGH (ref 0.3–4.0)
Total CK: 216 U/L — ABNORMAL HIGH (ref 7–177)
Troponin I: <0.3 ng/mL (ref <0.30)

## 2011-05-12 LAB — PHOSPHORUS: Phosphorus: 3.6 mg/dL (ref 2.3–4.6)

## 2011-05-12 MED ORDER — ALBUTEROL SULFATE (5 MG/ML) 0.5% IN NEBU
2.5000 mg | INHALATION_SOLUTION | RESPIRATORY_TRACT | Status: AC
  Administered 2011-05-12 (×2): 2.5 mg via RESPIRATORY_TRACT
  Filled 2011-05-12 (×2): qty 0.5

## 2011-05-12 MED ORDER — METHYLPREDNISOLONE SODIUM SUCC 125 MG IJ SOLR
80.0000 mg | Freq: Three times a day (TID) | INTRAMUSCULAR | Status: DC
  Administered 2011-05-12 – 2011-05-14 (×7): 80 mg via INTRAVENOUS
  Filled 2011-05-12: qty 1.28
  Filled 2011-05-12 (×2): qty 2
  Filled 2011-05-12: qty 1.28
  Filled 2011-05-12: qty 2
  Filled 2011-05-12 (×7): qty 1.28

## 2011-05-12 MED ORDER — ALBUTEROL SULFATE (5 MG/ML) 0.5% IN NEBU
INHALATION_SOLUTION | RESPIRATORY_TRACT | Status: AC
  Administered 2011-05-12: 2.5 mg via RESPIRATORY_TRACT
  Filled 2011-05-12: qty 0.5

## 2011-05-12 MED ORDER — HEPARIN SODIUM (PORCINE) 5000 UNIT/ML IJ SOLN
5000.0000 [IU] | Freq: Two times a day (BID) | INTRAMUSCULAR | Status: DC
  Administered 2011-05-12 – 2011-05-18 (×13): 5000 [IU] via SUBCUTANEOUS
  Filled 2011-05-12 (×14): qty 1

## 2011-05-12 MED ORDER — METHYLPREDNISOLONE SODIUM SUCC 125 MG IJ SOLR
80.0000 mg | Freq: Three times a day (TID) | INTRAMUSCULAR | Status: DC
  Filled 2011-05-12 (×2): qty 1.28

## 2011-05-12 MED ORDER — IPRATROPIUM BROMIDE 0.02 % IN SOLN
0.5000 mg | RESPIRATORY_TRACT | Status: DC
  Administered 2011-05-12 – 2011-05-18 (×32): 0.5 mg via RESPIRATORY_TRACT
  Filled 2011-05-12 (×30): qty 2.5

## 2011-05-12 MED ORDER — ALBUTEROL SULFATE (5 MG/ML) 0.5% IN NEBU
2.5000 mg | INHALATION_SOLUTION | RESPIRATORY_TRACT | Status: DC
  Administered 2011-05-12 – 2011-05-18 (×31): 2.5 mg via RESPIRATORY_TRACT
  Filled 2011-05-12 (×29): qty 0.5

## 2011-05-12 MED ORDER — HYDRALAZINE HCL 20 MG/ML IJ SOLN
5.0000 mg | INTRAMUSCULAR | Status: DC | PRN
  Administered 2011-05-12 – 2011-05-13 (×2): 10 mg via INTRAVENOUS
  Administered 2011-05-13 – 2011-05-14 (×2): 5 mg via INTRAVENOUS
  Filled 2011-05-12 (×5): qty 1

## 2011-05-12 NOTE — Consult Note (Signed)
Hospital Admission Note Date: 05/12/2011  Patient name: [name] Medical record number: [id number] Date of birth: December 27, 1923 Age: 77 y.o. Gender: female PCP: Pearla Dubonnet, MD, MD  PUlmonary: Dr Sandrea Hughs  Medical Service: PCCM,MD  Brief Summary: FLu A despite flu shot (sick contact +), AECOPD, Baseline Gold stage 3 copd, ex-smoker, Chronic pred and o2 at home. Admitted 05/11/11 to the floor and transferred to ICU 12/29. PCCM consulted 12/29  Anti-infectives:  Anti-infectives     Start     Dose/Rate Route Frequency Ordered Stop   05/12/11 1000   azithromycin (ZITHROMAX) tablet 250 mg        250 mg Oral Daily 05/11/11 0637 05/16/11 0959   05/12/11 0800   vancomycin (VANCOCIN) IVPB 1000 mg/200 mL premix        1,000 mg 200 mL/hr over 60 Minutes Intravenous Every 24 hours 05/11/11 0850     05/11/11 2345   oseltamivir (TAMIFLU) capsule 75 mg        75 mg Oral 2 times daily 05/11/11 2333     05/11/11 1000   azithromycin (ZITHROMAX) tablet 500 mg        500 mg Oral Daily 05/11/11 0637 05/11/11 1023   05/11/11 0700   vancomycin (VANCOCIN) IVPB 1000 mg/200 mL premix        1,000 mg 200 mL/hr over 60 Minutes Intravenous  Once 05/11/11 0658 05/11/11 0850   05/11/11 0245   azithromycin (ZITHROMAX) 500 mg in dextrose 5 % 250 mL IVPB        500 mg 250 mL/hr over 60 Minutes Intravenous  Once 05/11/11 0244 05/11/11 0444           Best Practice/Protocols:  VTE Prophylaxis: Heparin (SQ)   Recent Labs  Basename 05/12/11 0630   PLT 187  ] none   Studies:    Key Events: 12/28 admit 12/29: moved to ICU. Several family concerns aboutquality of care on 12/28 on floor    History of Present Illness: 77 year old female with multiple medical problems, full code, cared for by daughter at home though independent. Gold stage III COPD patient home oxygen dependent and on chronic prednisone followed by Dr. Sandrea Hughs. Ex-smoker. She did get a flu shot this season.  Earlier in the week her daughter to care with flulike symptoms following exposure to someone with cough in the church. A few days later patient developed flulike illness and had altered mental status and was brought in to the hospital and admitted on 05/03/2011. Influenza A virus infection was diagnosed and confirmed. She had associated COPD exacerbation. She was initially admitted to the floor and treated with Tamiflu, steroids and also antibiotics over concern of pneumonia on chest x-ray [chest chronic right-sided atelectasis on chest x-ray]. However despite this she developed slightly more labored dyspnea on the morning of 05/12/2011 and was transferred to the intensive care unit. Pulmonary critical care medicine has been consulted for evaluation. Since transfer to the intensive care unit her blood pressures been high although she's not a known hypertensive and is not recorded to have any antihypertensives at home. Systolic blood pressures 180 with a mean artery pressure of 100  According to the patient her nebulizers were not administered on time and that's why she was more dyspneic. Daughter and patient's son expressed several complaints about the quality of care, efficiency of care and medical support on 05/03/2011 on the medical floor. They feel that they had to spend the night in the day with the patient  to ensure quality of care otherwise I feel patient might have deteriorated further and potentially died.   Allergies: Levaquin; Penicillins; Statins; and Sulfonamide derivatives  Past Medical History  Diagnosis Date  . Osteoporosis, unspecified     9 Compression Fx with slt cord deformity   . Other abnormal glucose   . Other and unspecified hyperlipidemia   . Esophageal reflux   . Chronic airway obstruction, not elsewhere classified     On home O2 and daily Prednisone since march 2009. PFTs 09/05/07 FEV1 49% with improvement by 8% ratio 39% and diffusing capacity 58%    . Unspecified  hypothyroidism   . Tobacco abuse, in remission quit 2002  . Atelectasis of right lung     chronic rt lower lung seen on cT 2010    Past Surgical History  Procedure Date  . Cholecystectomy   . Ankle fracture surgery     left 10/12/2008    Family History  Problem Relation Age of Onset  . Emphysema Brother     multiple  . Emphysema Father     History   Social History  . Marital Status: Married    Spouse Name: N/A    Number of Children: 2  . Years of Education: N/A   Occupational History  . Retired    Social History Main Topics  . Smoking status: Former Smoker -- 1.5 packs/day for 50 years    Types: Cigarettes    Quit date: 05/14/2000  . Smokeless tobacco: Not on file  . Alcohol Use: Not on file  . Drug Use: Not on file  . Sexually Active: Not on file   Other Topics Concern  . Not on file   Social History Narrative  . No narrative on file    Prescriptions prior to admission  Medication Sig Dispense Refill  . albuterol (PROVENTIL HFA) 108 (90 BASE) MCG/ACT inhaler Inhale 2 puffs into the lungs every 4 (four) hours as needed for wheezing.      Marland Kitchen aspirin 81 MG tablet Take 81 mg by mouth daily.       . budesonide (PULMICORT) 0.25 MG/2ML nebulizer solution Take 0.25 mg by nebulization daily.        . calcium citrate-vitamin D (CITRACAL+D) 315-200 MG-UNIT per tablet Take 2 tablets by mouth daily.        . Cholecalciferol (VITAMIN D3) 1000 UNITS tablet Take 2,000 Units by mouth 2 (two) times daily.       . clopidogrel (PLAVIX) 75 MG tablet Take 75 mg by mouth daily.        . diazepam (VALIUM) 5 MG tablet Take 2.5 mg by mouth 2 (two) times daily as needed.       . doxycycline (VIBRA-TABS) 100 MG tablet Take 2 today then 1 daily until gone  8 tablet  0  . ferrous sulfate 325 (65 FE) MG EC tablet Take 1 tablet (325 mg total) by mouth 2 (two) times daily.      . furosemide (LASIX) 40 MG tablet Take 60 mg by mouth daily. Take 1 and 1/2 tabs by mouth once daily      . insulin  aspart (NOVOLOG) 100 UNIT/ML injection Inject 5-10 Units into the skin 3 (three) times daily before meals. Sliding scale noon meal and 10 units at dinner as directed      . insulin lispro protamine-insulin lispro (HUMALOG 75/25) (75-25) 100 UNIT/ML SUSP Inject 8-28 Units into the skin 2 (two) times daily with a meal. 25-28 units  Every morning and 8 units every evening      . ipratropium-albuterol (DUONEB) 0.5-2.5 (3) MG/3ML SOLN Take 3 mLs by nebulization every 6 (six) hours as needed. Shortness of breath       . levothyroxine (SYNTHROID, LEVOTHROID) 88 MCG tablet Take 88 mcg by mouth daily.        . Magnesium 250 MG TABS Take 1 tablet by mouth daily.        Marland Kitchen omeprazole (PRILOSEC) 20 MG capsule Take 1 capsule by mouth 2 (two) times daily before a meal.      . polyethylene glycol powder (MIRALAX) powder Take 17 g by mouth daily as needed.        . Potassium Gluconate 595 MG CAPS 1 tablet by mouth once daily      . predniSONE (DELTASONE) 10 MG tablet Take 1 tab by mouth once daily or increase to 2 tab until back to baseline when needed for increased shortness of breath      . senna (SENOKOT) 8.6 MG tablet Take 2 tablets by mouth 2 (two) times daily.       . sertraline (ZOLOFT) 50 MG tablet Take 50 mg by mouth daily.       . sodium chloride (OCEAN) 0.65 % nasal spray Place 2 sprays into the nose 2 (two) times daily as needed for congestion.      Marland Kitchen tiotropium (SPIRIVA) 18 MCG inhalation capsule Place 18 mcg into inhaler and inhale daily.        . vitamin C (ASCORBIC ACID) 500 MG tablet Take 1,000 mg by mouth daily.            Review of Systems:   Constitutional:  Generally weak, and dyspnea HEENT:   No headaches,  Difficulty swallowing,  Tooth/dental problems,  Sore throat,                No sneezing, itching, ear ache, nasal congestion, post nasal drip,   CV:  No chest pain,  Orthopnea, PND, swelling in lower extremities, anasarca, dizziness, palpitations  GI  No heartburn, indigestion,  abdominal pain, nausea, vomiting, diarrhea, change in bowel habits, loss of appetite  Resp: Significant for dyspnea cough and labored breathing  Skin: no rash or lesions.  GU: no dysuria, change in color of urine, no urgency or frequency.  No flank pain.  MS:  No joint pain or swelling.  No decreased range of motion.  No back pain.  Psych:  Family feel mental status is changed and she is a little more confused   Physical Exam:  Filed Vitals:   05/12/11 0253 05/12/11 0530 05/12/11 0921 05/12/11 1045  BP:  122/67  183/85  Pulse:  78  90  Temp:  96.6 F (35.9 C)  98.1 F (36.7 C)  TempSrc:  Oral  Oral  Resp:  18  22  Height:    5\' 1"  (1.549 m)  Weight:    65 kg (143 lb 4.8 oz)  SpO2: 97% 96% 95% 99%    Gen: Obese Body mass index is 27.08 kg/(m^2). cushingoid   Neuro: Pleasant, well-nourished, in no distress,  normal affect. Displacements of humor but family says she is mildly confused  ENT: No lesions,  mouth clear,  oropharynx clear, no postnasal drip  Neck: No JVD, no TMG, no carotid bruits  Lungs: Mildly labored breathing. Not paradoxical. Mild use of accessory muscles present. Significant bilateral wheezing present and some crackles  Cardiovascular: RRR, heart sounds normal, no murmur or  gallops, no peripheral edema  Abdomen: soft and NT, no HSM,  BS normal  Musculoskeletal: No deformities, no cyanosis or clubbing  Skin: Warm, no lesions or rashes  Labs Results for orders placed during the hospital encounter of 05/10/11 (from the past 48 hour(s))  GLUCOSE, CAPILLARY     Status: Normal   Collection Time   05/10/11  9:28 PM      Component Value Range Comment   Glucose-Capillary 92  70 - 99 (mg/dL)   URINE CULTURE     Status: Normal   Collection Time   05/10/11  9:39 PM      Component Value Range Comment   Specimen Description URINE, CATHETERIZED      Special Requests NONE      Setup Time [id number]      Colony Count NO GROWTH      Culture NO GROWTH       Report Status 05/11/2011 FINAL     URINALYSIS, ROUTINE W REFLEX MICROSCOPIC     Status: Normal   Collection Time   05/10/11  9:39 PM      Component Value Range Comment   Color, Urine YELLOW  YELLOW     APPearance CLEAR  CLEAR     Specific Gravity, Urine 1.012  1.005 - 1.030     pH 6.0  5.0 - 8.0     Glucose, UA NEGATIVE  NEGATIVE (mg/dL)    Hgb urine dipstick NEGATIVE  NEGATIVE     Bilirubin Urine NEGATIVE  NEGATIVE     Ketones, ur NEGATIVE  NEGATIVE (mg/dL)    Protein, ur NEGATIVE  NEGATIVE (mg/dL)    Urobilinogen, UA 0.2  0.0 - 1.0 (mg/dL)    Nitrite NEGATIVE  NEGATIVE     Leukocytes, UA NEGATIVE  NEGATIVE  MICROSCOPIC NOT DONE ON URINES WITH NEGATIVE PROTEIN, BLOOD, LEUKOCYTES, NITRITE, OR GLUCOSE <1000 mg/dL.  CBC     Status: Normal   Collection Time   05/10/11 11:31 PM      Component Value Range Comment   WBC 9.7  4.0 - 10.5 (K/uL)    RBC 4.14  3.87 - 5.11 (MIL/uL)    Hemoglobin 13.0  12.0 - 15.0 (g/dL)    HCT 04.5  40.9 - 81.1 (%)    MCV 94.4  78.0 - 100.0 (fL)    MCH 31.4  26.0 - 34.0 (pg)    MCHC 33.2  30.0 - 36.0 (g/dL)    RDW 91.4  78.2 - 95.6 (%)    Platelets 222  150 - 400 (K/uL)   DIFFERENTIAL     Status: Abnormal   Collection Time   05/10/11 11:31 PM      Component Value Range Comment   Neutrophils Relative 77  43 - 77 (%)    Neutro Abs 7.5  1.7 - 7.7 (K/uL)    Lymphocytes Relative 10 (*) 12 - 46 (%)    Lymphs Abs 1.0  0.7 - 4.0 (K/uL)    Monocytes Relative 11  3 - 12 (%)    Monocytes Absolute 1.1 (*) 0.1 - 1.0 (K/uL)    Eosinophils Relative 1  0 - 5 (%)    Eosinophils Absolute 0.1  0.0 - 0.7 (K/uL)    Basophils Relative 1  0 - 1 (%)    Basophils Absolute 0.1  0.0 - 0.1 (K/uL)   POCT I-STAT TROPONIN I     Status: Normal   Collection Time   05/10/11 11:46 PM      Component  Value Range Comment   Troponin i, poc 0.02  0.00 - 0.08 (ng/mL)    Comment 3            POCT I-STAT, CHEM 8     Status: Abnormal   Collection Time   05/10/11 11:47 PM      Component  Value Range Comment   Sodium 139  135 - 145 (mEq/L)    Potassium 5.1  3.5 - 5.1 (mEq/L)    Chloride 95 (*) 96 - 112 (mEq/L)    BUN 36 (*) 6 - 23 (mg/dL)    Creatinine, Ser 4.13  0.50 - 1.10 (mg/dL)    Glucose, Bld 244 (*) 70 - 99 (mg/dL)    Calcium, Ion 0.10  1.12 - 1.32 (mmol/L)    TCO2 40  0 - 100 (mmol/L)    Hemoglobin 13.9  12.0 - 15.0 (g/dL)    HCT 27.2  53.6 - 64.4 (%)   CULTURE, BLOOD (ROUTINE X 2)     Status: Normal (Preliminary result)   Collection Time   05/11/11  3:06 AM      Component Value Range Comment   Specimen Description BLOOD LEFT ANTECUBITAL      Special Requests BOTTLES DRAWN AEROBIC AND ANAEROBIC 9CC      Setup Time [id number]      Culture        Value:        BLOOD CULTURE RECEIVED NO GROWTH TO DATE CULTURE WILL BE HELD FOR 5 DAYS BEFORE ISSUING A FINAL NEGATIVE REPORT   Report Status PENDING     CULTURE, BLOOD (ROUTINE X 2)     Status: Normal (Preliminary result)   Collection Time   05/11/11  3:10 AM      Component Value Range Comment   Specimen Description BLOOD RIGHT ANTECUBITAL      Special Requests BOTTLES DRAWN AEROBIC AND ANAEROBIC 5CC      Setup Time [id number]      Culture        Value:        BLOOD CULTURE RECEIVED NO GROWTH TO DATE CULTURE WILL BE HELD FOR 5 DAYS BEFORE ISSUING A FINAL NEGATIVE REPORT   Report Status PENDING     COMPREHENSIVE METABOLIC PANEL     Status: Abnormal   Collection Time   05/11/11  7:20 AM      Component Value Range Comment   Sodium 136  135 - 145 (mEq/L)    Potassium 4.1  3.5 - 5.1 (mEq/L)    Chloride 95 (*) 96 - 112 (mEq/L)    CO2 36 (*) 19 - 32 (mEq/L)    Glucose, Bld 230 (*) 70 - 99 (mg/dL)    BUN 27 (*) 6 - 23 (mg/dL)    Creatinine, Ser 4.16 (*) 0.50 - 1.10 (mg/dL)    Calcium 8.5  8.4 - 10.5 (mg/dL)    Total Protein 6.2  6.0 - 8.3 (g/dL)    Albumin 3.0 (*) 3.5 - 5.2 (g/dL)    AST 20  0 - 37 (U/L)    ALT 13  0 - 35 (U/L)    Alkaline Phosphatase 56  39 - 117 (U/L)    Total Bilirubin 0.3  0.3 - 1.2  (mg/dL)    GFR calc non Af Amer 41 (*) >90 (mL/min)    GFR calc Af Amer 47 (*) >90 (mL/min)   MAGNESIUM     Status: Normal   Collection Time   05/11/11  7:20 AM  Component Value Range Comment   Magnesium 1.7  1.5 - 2.5 (mg/dL)   CBC     Status: Abnormal   Collection Time   05/11/11  7:20 AM      Component Value Range Comment   WBC 10.1  4.0 - 10.5 (K/uL)    RBC 3.54 (*) 3.87 - 5.11 (MIL/uL)    Hemoglobin 10.9 (*) 12.0 - 15.0 (g/dL)    HCT 60.4 (*) 54.0 - 46.0 (%)    MCV 95.2  78.0 - 100.0 (fL)    MCH 30.8  26.0 - 34.0 (pg)    MCHC 32.3  30.0 - 36.0 (g/dL)    RDW 98.1  19.1 - 47.8 (%)    Platelets 200  150 - 400 (K/uL)   DIFFERENTIAL     Status: Abnormal   Collection Time   05/11/11  7:20 AM      Component Value Range Comment   Neutrophils Relative 78 (*) 43 - 77 (%)    Neutro Abs 7.9 (*) 1.7 - 7.7 (K/uL)    Lymphocytes Relative 12  12 - 46 (%)    Lymphs Abs 1.2  0.7 - 4.0 (K/uL)    Monocytes Relative 9  3 - 12 (%)    Monocytes Absolute 0.9  0.1 - 1.0 (K/uL)    Eosinophils Relative 1  0 - 5 (%)    Eosinophils Absolute 0.1  0.0 - 0.7 (K/uL)    Basophils Relative 1  0 - 1 (%)    Basophils Absolute 0.1  0.0 - 0.1 (K/uL)   HEMOGLOBIN A1C     Status: Abnormal   Collection Time   05/11/11  7:20 AM      Component Value Range Comment   Hemoglobin A1C 8.1 (*) <5.7 (%)    Mean Plasma Glucose 186 (*) <117 (mg/dL)   GLUCOSE, CAPILLARY     Status: Abnormal   Collection Time   05/11/11  7:55 AM      Component Value Range Comment   Glucose-Capillary 261 (*) 70 - 99 (mg/dL)    Comment 1 Notify RN      Comment 2 Documented in Chart     GLUCOSE, CAPILLARY     Status: Abnormal   Collection Time   05/11/11 12:07 PM      Component Value Range Comment   Glucose-Capillary 219 (*) 70 - 99 (mg/dL)   INFLUENZA PANEL BY PCR     Status: Abnormal   Collection Time   05/11/11  4:31 PM      Component Value Range Comment   Influenza A By PCR POSITIVE (*) NEGATIVE     Influenza B By PCR  NEGATIVE  NEGATIVE     H1N1 flu by pcr NOT DETECTED  NOT DETECTED    GLUCOSE, CAPILLARY     Status: Abnormal   Collection Time   05/11/11  4:44 PM      Component Value Range Comment   Glucose-Capillary 285 (*) 70 - 99 (mg/dL)   GLUCOSE, CAPILLARY     Status: Abnormal   Collection Time   05/11/11  8:02 PM      Component Value Range Comment   Glucose-Capillary 249 (*) 70 - 99 (mg/dL)   GLUCOSE, CAPILLARY     Status: Abnormal   Collection Time   05/11/11 11:53 PM      Component Value Range Comment   Glucose-Capillary 302 (*) 70 - 99 (mg/dL)   GLUCOSE, CAPILLARY     Status: Abnormal   Collection  Time   05/12/11  4:04 AM      Component Value Range Comment   Glucose-Capillary 228 (*) 70 - 99 (mg/dL)   BASIC METABOLIC PANEL     Status: Abnormal   Collection Time   05/12/11  6:30 AM      Component Value Range Comment   Sodium 139  135 - 145 (mEq/L)    Potassium 3.8  3.5 - 5.1 (mEq/L)    Chloride 99  96 - 112 (mEq/L)    CO2 34 (*) 19 - 32 (mEq/L)    Glucose, Bld 213 (*) 70 - 99 (mg/dL)    BUN 21  6 - 23 (mg/dL)    Creatinine, Ser 1.61  0.50 - 1.10 (mg/dL) DELTA CHECK NOTED   Calcium 8.5  8.4 - 10.5 (mg/dL)    GFR calc non Af Amer 77 (*) >90 (mL/min)    GFR calc Af Amer 89 (*) >90 (mL/min)   CBC     Status: Abnormal   Collection Time   05/12/11  6:30 AM      Component Value Range Comment   WBC 7.1  4.0 - 10.5 (K/uL)    RBC 3.37 (*) 3.87 - 5.11 (MIL/uL)    Hemoglobin 10.5 (*) 12.0 - 15.0 (g/dL)    HCT 09.6 (*) 04.5 - 46.0 (%)    MCV 91.7  78.0 - 100.0 (fL)    MCH 31.2  26.0 - 34.0 (pg)    MCHC 34.0  30.0 - 36.0 (g/dL)    RDW 40.9  81.1 - 91.4 (%)    Platelets 187  150 - 400 (K/uL)   DIFFERENTIAL     Status: Abnormal   Collection Time   05/12/11  6:30 AM      Component Value Range Comment   Neutrophils Relative 89 (*) 43 - 77 (%)    Neutro Abs 6.3  1.7 - 7.7 (K/uL)    Lymphocytes Relative 7 (*) 12 - 46 (%)    Lymphs Abs 0.5 (*) 0.7 - 4.0 (K/uL)    Monocytes Relative 3  3  - 12 (%)    Monocytes Absolute 0.2  0.1 - 1.0 (K/uL)    Eosinophils Relative 0  0 - 5 (%)    Eosinophils Absolute 0.0  0.0 - 0.7 (K/uL)    Basophils Relative 1  0 - 1 (%)    Basophils Absolute 0.0  0.0 - 0.1 (K/uL)   PRO B NATRIURETIC PEPTIDE     Status: Abnormal   Collection Time   05/12/11  6:30 AM      Component Value Range Comment   Pro B Natriuretic peptide (BNP) 572.4 (*) 0 - 450 (pg/mL)   GLUCOSE, CAPILLARY     Status: Abnormal   Collection Time   05/12/11  7:30 AM      Component Value Range Comment   Glucose-Capillary 229 (*) 70 - 99 (mg/dL)     Imaging results:  Dg Chest 2 View  05/11/2011  *RADIOLOGY REPORT*  Clinical Data: Cough  CHEST - 2 VIEW  Comparison: 08/22/2010  Findings: Moderate cardiac enlargement.  No pleural effusion identified.  Scar and/or linear atelectasis is noted in the right base.  No airspace consolidation identified.  Chronic bronchitic changes are noted bilaterally.  The bones are diffusely osteopenic and there are multilevel compression deformities noted throughout the thoracic spine. Unchanged since the previous study.  Advanced osteoarthritis involves the right glenohumeral joint.  IMPRESSION: 1. Right base scar and/or atelectasis. 2.  Bronchitic  changes.  Original Report Authenticated By: Rosealee Albee, M.D.   I personally review the x-ray and I feel that the changes noted in the x-ray are all chronic I do not see any evidence of acute pneumonia  Assessment & Plan  Patient Active Hospital Problem List: Influenza A with respiratory manifestations (05/12/2011)   Assessment: Influenza due to sick contacts despite flu shot. High risk candidate for mortality due to age and COPD    Plan: Agree with Tamiflu for 5 days and airborne isolation. Family advised to talk to the individual primary care physician's and get Tamiflu for treatment/prophylaxis  COPD exacerbation with Acute Respiratory Failure (05/11/2011)   Assessment: This the complication of  influenza. Currently moderate to severe but clinically not in need of intubation    Plan: Start noninvasive positive pressure ventilation and hopefully avoid intubation. She is a full code . Start aggressive bronchodilation and continue IV steroids and antibiotics  COPD UNSPECIFIED (06/14/2007)   Assessment: Baseline Gold stage III COPD, oxygen dependent, prednisone dependent at baseline   Plan: Currently treat COPD exacerbation  Hypertension (05/12/2011)   Assessment: Has new hypertension following start a steroid therapy    Plan: Hydralazine as needed . Goal systolic less than 160 . Check EKG and rule out myocardial infarction. Check BNP  Encephalopathy acute (05/12/2011)   Assessment: This is improved since admission currently I feel that she is oriented but family feel that she is still confused this is probably a result of COPD exacerbation and influenza illness    Plan: Monitor closely for delirium. Avoid benzodiazepines Global issues  Assessment: Significant family dissatisfaction with several issues and care on 05/11/2011. She is a full code and I clarified with family  Plan:  Referred them to the office the patient experience. Full code      [facility]  05/12/2011      The patient is critically ill with multiple organ systems failure and requires high complexity decision making for assessment and support, frequent evaluation and titration of therapies, application of advanced monitoring technologies and extensive interpretation of multiple databases.   Critical Care Time devoted to patient care services described in this note is  75  minutes.

## 2011-05-12 NOTE — Progress Notes (Signed)
Subjective: Elderly woman admitted with exacerbation of COPD with probable PNA w/o focal infiltrates on x-ray. She is positive for inlfuenza A and is on tamiflu as well as broad spectrum antibiotic coverage with Vanc and Zithro.  Objective: Lab: WBC 7.1, Hgb 10.5 (baseline 13.9)         Metabolic  Cr. 0.69  Imaging: no new imaging   Physical Exam: Filed Vitals:   05/12/11 0530  BP: 122/67  Pulse: 78  Temp: 96.6 F (35.9 C)  Resp: 18    Intake/Output Summary (Last 24 hours) at 05/12/11 0831 Last data filed at 05/12/11 0700  Gross per 24 hour  Intake 1393.33 ml  Output   2050 ml  Net -656.67 ml   Plethoric elderly white woman who is in respiratory distress with increased work of breathing who admits to getting fatigues Pulm - increased work of breathing using accessory muscles, neck retractions. Very tight with decreased breath sounds, prolonged expiratory phase, fatiguing. Cor - regular rate Abd- very firm with use in respirations. Ext - no edemaj     Assessment/Plan: 1. Pulm - patient with underlying O2 dependent COPD now with probable viral PNA with positive nasal swab for influenza. Patient has increased work of breathing. Confirmed with son that she is a full code and a candidate for intubation if needed. Plan - transfer to step down           Pul/CCM consult called- with fatigue may be a candidate for BiPAP           Increase steroids to 80 mg tid           Continue tamiflu, VAnc and Zithro              Illene Regulus 05/12/2011, 8:29 AM

## 2011-05-12 NOTE — Progress Notes (Signed)
Appreciate CCM consult. Patient now on BiPAP and dong much better. She does seem a bit confused. Current treatment plans w/o change except for addition of BiPAP.  Son and dtr-in-law prescribed Tamiflu prophylaxis.

## 2011-05-12 NOTE — Plan of Care (Signed)
Problem: Phase I Progression Outcomes Goal: Flu/PneumoVaccines if indicated Outcome: Completed/Met Date Met:  05/12/11 Pt states she had flu vaccine in October 2012, informed pt flu screen was positive, started on tamiflu

## 2011-05-13 DIAGNOSIS — J111 Influenza due to unidentified influenza virus with other respiratory manifestations: Secondary | ICD-10-CM

## 2011-05-13 DIAGNOSIS — G9340 Encephalopathy, unspecified: Secondary | ICD-10-CM

## 2011-05-13 DIAGNOSIS — J441 Chronic obstructive pulmonary disease with (acute) exacerbation: Secondary | ICD-10-CM

## 2011-05-13 DIAGNOSIS — J9600 Acute respiratory failure, unspecified whether with hypoxia or hypercapnia: Secondary | ICD-10-CM

## 2011-05-13 LAB — COMPREHENSIVE METABOLIC PANEL
ALT: 17 U/L (ref 0–35)
AST: 23 U/L (ref 0–37)
Albumin: 3 g/dL — ABNORMAL LOW (ref 3.5–5.2)
Calcium: 8.9 mg/dL (ref 8.4–10.5)
Creatinine, Ser: 0.69 mg/dL (ref 0.50–1.10)
GFR calc non Af Amer: 77 mL/min — ABNORMAL LOW (ref >90)
Sodium: 136 mEq/L (ref 135–145)
Total Protein: 6.6 g/dL (ref 6.0–8.3)

## 2011-05-13 LAB — GLUCOSE, CAPILLARY
Glucose-Capillary: 198 mg/dL — ABNORMAL HIGH (ref 70–99)
Glucose-Capillary: 295 mg/dL — ABNORMAL HIGH (ref 70–99)
Glucose-Capillary: 275 mg/dL — ABNORMAL HIGH (ref 70–99)

## 2011-05-13 LAB — CARDIAC PANEL(CRET KIN+CKTOT+MB+TROPI)
CK, MB: 4.6 ng/mL — ABNORMAL HIGH (ref 0.3–4.0)
CK, MB: 5.3 ng/mL — ABNORMAL HIGH (ref 0.3–4.0)
Relative Index: 2.4 (ref 0.0–2.5)
Total CK: 206 U/L — ABNORMAL HIGH (ref 7–177)
Troponin I: <0.3 ng/mL (ref <0.30)

## 2011-05-13 MED ORDER — BIOTENE DRY MOUTH MT LIQD
15.0000 mL | Freq: Two times a day (BID) | OROMUCOSAL | Status: DC
  Administered 2011-05-13 – 2011-05-17 (×9): 15 mL via OROMUCOSAL

## 2011-05-13 MED ORDER — CHLORHEXIDINE GLUCONATE 0.12 % MT SOLN
15.0000 mL | Freq: Two times a day (BID) | OROMUCOSAL | Status: DC
  Administered 2011-05-13 – 2011-05-18 (×10): 15 mL via OROMUCOSAL
  Filled 2011-05-13 (×12): qty 15

## 2011-05-13 NOTE — Progress Notes (Signed)
Subjective: Patient is feeling much better, very talkative, no respiratory distress. She has been on BiPAP as much as tolerated.  Objective: Lab: Trop I <0.30 Imaging: no new imaging   Physical Exam: Filed Vitals:   05/13/11 0814  BP: 163/76  Pulse: 98  Temp: 97.9  Resp: 21    Intake/Output Summary (Last 24 hours) at 05/13/11 1026 Last data filed at 05/13/11 0847  Gross per 24 hour  Intake   1600 ml  Output   2825 ml  Net  -1225 ml  Pulm -Shallow inspirations, no rales, end - expiratory wheeze, no rhonchi Cardiac - BP is running a little high, regular rhythm Neuro - awake, very alert, MAE       Assessment/Plan: 1. Pulm - doing much better Plan - continue BiPAP as tolerated            Continue Tamiflu, Azithro. Will d/c vanc            Continue solumedrol 80 mg tid today, decrease tomorrow if doing well\  2. Hypertension - continues to excursions to SBP - 180 Plan - continue hydralazine prn.   3. Encephalopathy - seems bright, alert, w/o confusion     Illene Regulus 05/13/2011, 10:25 AM

## 2011-05-13 NOTE — Consult Note (Signed)
Hospital Admission Note Date: 05/13/2011  Patient name: [name] Medical record number: [id number] Date of birth: 01-05-1924 Age: 77 y.o. Gender: female PCP: Pearla Dubonnet, MD, MD  PUlmonary: Dr Sandrea Hughs  Medical Service: PCCM,MD  Brief Summary: FLu A despite flu shot (sick contact +), AECOPD, Baseline Gold stage 3 copd, ex-smoker, Chronic pred and o2 at home. Admitted 05/11/11 to the floor and transferred to ICU 12/29. PCCM consulted 12/29  Anti-infectives:  Anti-infectives     Start     Dose/Rate Route Frequency Ordered Stop   05/12/11 1000   azithromycin (ZITHROMAX) tablet 250 mg        250 mg Oral Daily 05/11/11 0637 05/16/11 0959   05/12/11 0800   vancomycin (VANCOCIN) IVPB 1000 mg/200 mL premix  Status:  Discontinued        1,000 mg 200 mL/hr over 60 Minutes Intravenous Every 24 hours 05/11/11 0850 05/13/11 1040   05/11/11 2345   oseltamivir (TAMIFLU) capsule 75 mg        75 mg Oral 2 times daily 05/11/11 2333 05/16/11 2159   05/11/11 1000   azithromycin (ZITHROMAX) tablet 500 mg        500 mg Oral Daily 05/11/11 0637 05/11/11 1023   05/11/11 0700   vancomycin (VANCOCIN) IVPB 1000 mg/200 mL premix        1,000 mg 200 mL/hr over 60 Minutes Intravenous  Once 05/11/11 0658 05/11/11 0850   05/11/11 0245   azithromycin (ZITHROMAX) 500 mg in dextrose 5 % 250 mL IVPB        500 mg 250 mL/hr over 60 Minutes Intravenous  Once 05/11/11 0244 05/11/11 0444           Best Practice/Protocols:  VTE Prophylaxis: Heparin (SQ)   Recent Labs  Basename 05/12/11 0630   PLT 187  ] none   Studies:    Key Events: 12/28 admit 12/29: moved to ICU. Several family concerns aboutquality of care on 12/28 on floor    Overnight: Doing much better. Normal mental status. Eating. Feels improved. Slightly tachycardic still.    Filed Vitals:   05/13/11 1630  BP:   Pulse:   Temp: 98.3 F (36.8 C)  Resp:     EXAM  Gen: Obese Body mass index is 26.95 kg/(m^2).  cushingoid   Neuro: Pleasant, well-nourished, in no distress,  normal affect.   ENT: No lesions,  mouth clear,  oropharynx clear, no postnasal drip  Neck: No JVD, no TMG, no carotid bruits  Lungs: Much improved. No crackles. Very little wheeze. No acc muscle use  Cardiovascular: RRR, heart sounds normal, no murmur or gallops, no peripheral edema  Abdomen: soft and NT, no HSM,  BS normal  Musculoskeletal: No deformities, no cyanosis or clubbing  Skin: Warm, no lesions or rashes  Labs Results for orders placed during the hospital encounter of 05/10/11 (from the past 48 hour(s))  GLUCOSE, CAPILLARY     Status: Abnormal   Collection Time   05/11/11  8:02 PM      Component Value Range Comment   Glucose-Capillary 249 (*) 70 - 99 (mg/dL)   GLUCOSE, CAPILLARY     Status: Abnormal   Collection Time   05/11/11 11:53 PM      Component Value Range Comment   Glucose-Capillary 302 (*) 70 - 99 (mg/dL)   GLUCOSE, CAPILLARY     Status: Abnormal   Collection Time   05/12/11  4:04 AM      Component Value Range Comment  Glucose-Capillary 228 (*) 70 - 99 (mg/dL)   BASIC METABOLIC PANEL     Status: Abnormal   Collection Time   05/12/11  6:30 AM      Component Value Range Comment   Sodium 139  135 - 145 (mEq/L)    Potassium 3.8  3.5 - 5.1 (mEq/L)    Chloride 99  96 - 112 (mEq/L)    CO2 34 (*) 19 - 32 (mEq/L)    Glucose, Bld 213 (*) 70 - 99 (mg/dL)    BUN 21  6 - 23 (mg/dL)    Creatinine, Ser 1.61  0.50 - 1.10 (mg/dL) DELTA CHECK NOTED   Calcium 8.5  8.4 - 10.5 (mg/dL)    GFR calc non Af Amer 77 (*) >90 (mL/min)    GFR calc Af Amer 89 (*) >90 (mL/min)   CBC     Status: Abnormal   Collection Time   05/12/11  6:30 AM      Component Value Range Comment   WBC 7.1  4.0 - 10.5 (K/uL)    RBC 3.37 (*) 3.87 - 5.11 (MIL/uL)    Hemoglobin 10.5 (*) 12.0 - 15.0 (g/dL)    HCT 09.6 (*) 04.5 - 46.0 (%)    MCV 91.7  78.0 - 100.0 (fL)    MCH 31.2  26.0 - 34.0 (pg)    MCHC 34.0  30.0 - 36.0 (g/dL)      RDW 40.9  81.1 - 15.5 (%)    Platelets 187  150 - 400 (K/uL)   DIFFERENTIAL     Status: Abnormal   Collection Time   05/12/11  6:30 AM      Component Value Range Comment   Neutrophils Relative 89 (*) 43 - 77 (%)    Neutro Abs 6.3  1.7 - 7.7 (K/uL)    Lymphocytes Relative 7 (*) 12 - 46 (%)    Lymphs Abs 0.5 (*) 0.7 - 4.0 (K/uL)    Monocytes Relative 3  3 - 12 (%)    Monocytes Absolute 0.2  0.1 - 1.0 (K/uL)    Eosinophils Relative 0  0 - 5 (%)    Eosinophils Absolute 0.0  0.0 - 0.7 (K/uL)    Basophils Relative 1  0 - 1 (%)    Basophils Absolute 0.0  0.0 - 0.1 (K/uL)   PRO B NATRIURETIC PEPTIDE     Status: Abnormal   Collection Time   05/12/11  6:30 AM      Component Value Range Comment   Pro B Natriuretic peptide (BNP) 572.4 (*) 0 - 450 (pg/mL)   GLUCOSE, CAPILLARY     Status: Abnormal   Collection Time   05/12/11  7:30 AM      Component Value Range Comment   Glucose-Capillary 229 (*) 70 - 99 (mg/dL)   MRSA PCR SCREENING     Status: Normal   Collection Time   05/12/11 11:22 AM      Component Value Range Comment   MRSA by PCR NEGATIVE  NEGATIVE    MAGNESIUM     Status: Normal   Collection Time   05/12/11 12:14 PM      Component Value Range Comment   Magnesium 2.2  1.5 - 2.5 (mg/dL)   PHOSPHORUS     Status: Normal   Collection Time   05/12/11 12:14 PM      Component Value Range Comment   Phosphorus 3.6  2.3 - 4.6 (mg/dL)   GLUCOSE, CAPILLARY     Status:  Abnormal   Collection Time   05/12/11 12:35 PM      Component Value Range Comment   Glucose-Capillary 345 (*) 70 - 99 (mg/dL)    Comment 1 Documented in Chart      Comment 2 Notify RN     LACTIC ACID, PLASMA     Status: Normal   Collection Time   05/12/11  1:05 PM      Component Value Range Comment   Lactic Acid, Venous 1.8  0.5 - 2.2 (mmol/L)   PROCALCITONIN     Status: Normal   Collection Time   05/12/11  1:05 PM      Component Value Range Comment   Procalcitonin <0.10     PRO B NATRIURETIC PEPTIDE      Status: Abnormal   Collection Time   05/12/11  1:05 PM      Component Value Range Comment   Pro B Natriuretic peptide (BNP) 770.1 (*) 0 - 450 (pg/mL)   GLUCOSE, CAPILLARY     Status: Abnormal   Collection Time   05/12/11  2:05 PM      Component Value Range Comment   Glucose-Capillary 344 (*) 70 - 99 (mg/dL)    Comment 1 Notify RN      Comment 2 Documented in Chart     BLOOD GAS, ARTERIAL     Status: Abnormal   Collection Time   05/12/11  2:14 PM      Component Value Range Comment   FIO2 .30      Delivery systems BILEVEL POSITIVE AIRWAY PRESSURE      Mode BILEVEL POSITIVE AIRWAY PRESSURE      Inspiratory PAP 10      Expiratory PAP 5.0      pH, Arterial 7.436 (*) 7.350 - 7.400     pCO2 arterial 49.5 (*) 35.0 - 45.0 (mmHg)    pO2, Arterial 70.6 (*) 80.0 - 100.0 (mmHg)    Bicarbonate 32.7 (*) 20.0 - 24.0 (mEq/L)    TCO2 29.7  0 - 100 (mmol/L)    Acid-Base Excess 7.7 (*) 0.0 - 2.0 (mmol/L)    O2 Saturation 95.5      Patient temperature 98.6      Collection site RIGHT RADIAL      Drawn by COLLECTED BY RT      Sample type ARTERIAL DRAW      Allens test (pass/fail) PASS  PASS    GLUCOSE, CAPILLARY     Status: Abnormal   Collection Time   05/12/11  3:30 PM      Component Value Range Comment   Glucose-Capillary 297 (*) 70 - 99 (mg/dL)    Comment 1 Notify RN     CARDIAC PANEL(CRET KIN+CKTOT+MB+TROPI)     Status: Abnormal   Collection Time   05/12/11  3:51 PM      Component Value Range Comment   Total CK 216 (*) 7 - 177 (U/L)    CK, MB 5.0 (*) 0.3 - 4.0 (ng/mL)    Troponin I <0.30  <0.30 (ng/mL)    Relative Index 2.3  0.0 - 2.5    GLUCOSE, CAPILLARY     Status: Abnormal   Collection Time   05/12/11  8:03 PM      Component Value Range Comment   Glucose-Capillary 294 (*) 70 - 99 (mg/dL)    Comment 1 Documented in Chart      Comment 2 Notify RN     GLUCOSE, CAPILLARY     Status: Abnormal  Collection Time   05/12/11 11:36 PM      Component Value Range Comment    Glucose-Capillary 324 (*) 70 - 99 (mg/dL)    Comment 1 Documented in Chart      Comment 2 Notify RN     CARDIAC PANEL(CRET KIN+CKTOT+MB+TROPI)     Status: Abnormal   Collection Time   05/13/11 12:06 AM      Component Value Range Comment   Total CK 206 (*) 7 - 177 (U/L)    CK, MB 5.3 (*) 0.3 - 4.0 (ng/mL)    Troponin I <0.30  <0.30 (ng/mL)    Relative Index 2.6 (*) 0.0 - 2.5    COMPREHENSIVE METABOLIC PANEL     Status: Abnormal   Collection Time   05/13/11  3:10 AM      Component Value Range Comment   Sodium 136  135 - 145 (mEq/L)    Potassium 3.6  3.5 - 5.1 (mEq/L)    Chloride 95 (*) 96 - 112 (mEq/L)    CO2 34 (*) 19 - 32 (mEq/L)    Glucose, Bld 294 (*) 70 - 99 (mg/dL)    BUN 24 (*) 6 - 23 (mg/dL)    Creatinine, Ser 4.09  0.50 - 1.10 (mg/dL)    Calcium 8.9  8.4 - 10.5 (mg/dL)    Total Protein 6.6  6.0 - 8.3 (g/dL)    Albumin 3.0 (*) 3.5 - 5.2 (g/dL)    AST 23  0 - 37 (U/L)    ALT 17  0 - 35 (U/L)    Alkaline Phosphatase 54  39 - 117 (U/L)    Total Bilirubin 0.2 (*) 0.3 - 1.2 (mg/dL)    GFR calc non Af Amer 77 (*) >90 (mL/min)    GFR calc Af Amer 89 (*) >90 (mL/min)   GLUCOSE, CAPILLARY     Status: Abnormal   Collection Time   05/13/11  4:59 AM      Component Value Range Comment   Glucose-Capillary 259 (*) 70 - 99 (mg/dL)    Comment 1 Documented in Chart      Comment 2 Notify RN     GLUCOSE, CAPILLARY     Status: Abnormal   Collection Time   05/13/11  7:28 AM      Component Value Range Comment   Glucose-Capillary 198 (*) 70 - 99 (mg/dL)    Comment 1 Documented in Chart      Comment 2 Notify RN     CARDIAC PANEL(CRET KIN+CKTOT+MB+TROPI)     Status: Abnormal   Collection Time   05/13/11  8:07 AM      Component Value Range Comment   Total CK 191 (*) 7 - 177 (U/L)    CK, MB 4.6 (*) 0.3 - 4.0 (ng/mL)    Troponin I <0.30  <0.30 (ng/mL)    Relative Index 2.4  0.0 - 2.5    GLUCOSE, CAPILLARY     Status: Abnormal   Collection Time   05/13/11 12:20 PM      Component Value  Range Comment   Glucose-Capillary 295 (*) 70 - 99 (mg/dL)    Comment 1 Documented in Chart      Comment 2 Notify RN     GLUCOSE, CAPILLARY     Status: Abnormal   Collection Time   05/13/11  4:27 PM      Component Value Range Comment   Glucose-Capillary 386 (*) 70 - 99 (mg/dL)     Imaging results:  No  results found. I personally review the x-ray and I feel that the changes noted in the x-ray are all chronic I do not see any evidence of acute pneumonia  Assessment & Plan  Patient Active Hospital Problem List: Influenza A with respiratory manifestations (05/12/2011)   Assessment: Influenza due to sick contacts despite flu shot. High risk candidate for mortality due to age and COPD . Improved 12/30   Plan: Agree with Tamiflu for 5 days and airborne isolation.  COPD exacerbation with Acute Respiratory Failure (05/11/2011)   Assessment: This the complication of influenza. Improved 12/30 with bipap   Plan: bipap prn. Continue aggressive bronchodilation and continue IV steroids and antibiotics  COPD UNSPECIFIED (06/14/2007)   Assessment: Baseline Gold stage III COPD, oxygen dependent, prednisone dependent at baseline   Plan: Currently treat COPD exacerbation  Hypertension (05/12/2011)    Assessment: Has new hypertension following start a steroid therapy. Improved 12/30 . Trop x 3 negative. BNP 700s   Plan: Hydralazine as needed . Goal systolic less than 160 . Encephalopathy acute (05/12/2011)   Assessment: Due to AECOPD and flu. Normal on 12/30   Plan: Monitor closely for delirium. Avoid benzodiazepines Global issues  Assessment: Significant family dissatisfaction with several issues and care on 05/11/2011.   Plan:  Referred them to the office the patient experience 12/29. Full code. Liklely floor transfer 12/31      Shaketha Jeon  05/13/2011

## 2011-05-14 LAB — GLUCOSE, CAPILLARY: Glucose-Capillary: 252 mg/dL — ABNORMAL HIGH (ref 70–99)

## 2011-05-14 LAB — BASIC METABOLIC PANEL
BUN: 29 mg/dL — ABNORMAL HIGH (ref 6–23)
CO2: 32 mEq/L (ref 19–32)
Calcium: 8.5 mg/dL (ref 8.4–10.5)
Glucose, Bld: 341 mg/dL — ABNORMAL HIGH (ref 70–99)
Sodium: 135 mEq/L (ref 135–145)

## 2011-05-14 MED ORDER — METHYLPREDNISOLONE SODIUM SUCC 125 MG IJ SOLR
60.0000 mg | Freq: Two times a day (BID) | INTRAMUSCULAR | Status: DC
  Administered 2011-05-15: 02:00:00 via INTRAVENOUS
  Filled 2011-05-14 (×3): qty 0.96

## 2011-05-14 NOTE — Progress Notes (Signed)
Hospital Admission Note Date: 05/14/2011  Patient name: [name] Medical record number: [id number] Date of birth: Jul 25, 1923 Age: 77 y.o. Gender: female PCP: Pearla Dubonnet, MD, MD  PUlmonary: Dr Sandrea Hughs  Medical Service: PCCM,MD  Brief Summary: FLu A despite flu shot (sick contact +), AECOPD, Baseline Gold stage 3 copd, ex-smoker, Chronic pred and o2 at home. Admitted 05/11/11 to the floor and transferred to ICU 12/29. PCCM consulted 12/29  Anti-infectives:  Anti-infectives     Start     Dose/Rate Route Frequency Ordered Stop   05/12/11 1000   azithromycin (ZITHROMAX) tablet 250 mg        250 mg Oral Daily 05/11/11 0637 05/16/11 0959   05/12/11 0800   vancomycin (VANCOCIN) IVPB 1000 mg/200 mL premix  Status:  Discontinued        1,000 mg 200 mL/hr over 60 Minutes Intravenous Every 24 hours 05/11/11 0850 05/13/11 1040   05/11/11 2345   oseltamivir (TAMIFLU) capsule 75 mg        75 mg Oral 2 times daily 05/11/11 2333 05/16/11 2159   05/11/11 1000   azithromycin (ZITHROMAX) tablet 500 mg        500 mg Oral Daily 05/11/11 0637 05/11/11 1023   05/11/11 0700   vancomycin (VANCOCIN) IVPB 1000 mg/200 mL premix        1,000 mg 200 mL/hr over 60 Minutes Intravenous  Once 05/11/11 0658 05/11/11 0850   05/11/11 0245   azithromycin (ZITHROMAX) 500 mg in dextrose 5 % 250 mL IVPB        500 mg 250 mL/hr over 60 Minutes Intravenous  Once 05/11/11 0244 05/11/11 0444           Best Practice/Protocols:  VTE Prophylaxis: Heparin (SQ)   Recent Labs  Basename 05/12/11 0630   PLT 187  ] none   Studies:    Key Events: 12/28 admit 12/29: moved to ICU. Several family concerns aboutquality of care on 12/28 on floor    Overnight: Doing much better. Normal mental status. Eating. Feels improved.  Daughter concerned about high BP, mobilisation & 'preventing  pna'   Filed Vitals:   05/14/11 1000  BP: 163/62  Pulse: 95  Temp:   Resp: 21    EXAM  Gen: Obese  Body mass index is 25.70 kg/(m^2). cushingoid   Neuro: Pleasant, well-nourished, in no distress,  normal affect.   ENT: No lesions,  mouth clear,  oropharynx clear, no postnasal drip  Neck: No JVD, no TMG, no carotid bruits  Lungs: Much improved. No crackles. Very little wheeze. No acc muscle use  Cardiovascular: RRR, heart sounds normal, no murmur or gallops, no peripheral edema  Abdomen: soft and NT, no HSM,  BS normal  Musculoskeletal: No deformities, no cyanosis or clubbing  Skin: Warm, no lesions or rashes  Labs  Imaging results:  No results found.   Assessment & Plan  Patient Active Hospital Problem List: Influenza A with respiratory manifestations (05/12/2011)   Assessment: Influenza due to sick contacts despite flu shot. High risk candidate for mortality due to age and COPD . Improved 12/31   Plan: Agree with Tamiflu for 5 days and airborne isolation.   COPD exacerbation with Acute Respiratory Failure (05/11/2011)   Assessment: This the complication of influenza. Improved 12/30 with bipap   Plan:DC  bipap . Continue aggressive bronchodilation and OK to taper IV steroids -40 q 8h tomorrow and ct  antibiotics   COPD UNSPECIFIED (06/14/2007)   Assessment: Baseline Gold  stage III COPD, oxygen dependent, prednisone dependent at baseline   Plan: Currently treat COPD exacerbation  Hypertension (05/12/2011)    Assessment: Has new hypertension following start a steroid therapy. Improved 12/30 . Trop x 3 negative. BNP 700s   Plan: Hydralazine as needed . Goal systolic less than 160 . Encephalopathy acute (05/12/2011)   Assessment: Due to AECOPD and flu. Normal on 12/30   Plan: Improving delirium. Avoid benzodiazepines Global issues  Assessment: Significant family dissatisfaction with several issues and care on 05/11/2011.   Plan:   floor transfer 12/31, PT consult PCCM available PRN   [facility] Minor ACNP Adolph Pollack PCCM Pager [phone] till 3 pm If no answer page  [phone] 05/14/2011, 11:52 AM     Markanthony Gedney V.

## 2011-05-14 NOTE — Progress Notes (Signed)
Subjective: Pt much improved with minimal resp distress. Resp not labored. Cough with increased rhonchi. Stil with fever. Has influenza  Objective: Vital signs in last 24 hours: Temp:  [97.7 F (36.5 C)-101.3 F (38.5 C)] 100 F (37.8 C) (12/31 0400) Pulse Rate:  [85-98] 86  (12/31 0400) Resp:  [17-26] 17  (12/31 0400) BP: (128-183)/(51-81) 154/62 mmHg (12/31 0400) SpO2:  [97 %-100 %] 97 % (12/31 0427) FiO2 (%):  [30 %] 30 % (12/30 1200) Weight:  [61.7 kg (136 lb 0.4 oz)] 136 lb 0.4 oz (61.7 kg) (12/31 0100) Weight change: -3.3 kg (-7 lb 4.4 oz) Last BM Date: 05/13/11  Intake/Output from previous day: 12/30 0701 - 12/31 0700 In: 2225 [P.O.:925; I.V.:1100; IV Piggyback:200] Out: 1490 [Urine:1490] Intake/Output this shift:    General appearance: alert and mildly confused Neck: no JVD Resp: diffuse rhonchi, no wheezes Cardio: RRR GI: Soft, bss nl Extremities:no edema Skin: Warm and dry Neurologic: Mildly confused at times. Non focal  Lab Results:  Basename 05/12/11 0630  WBC 7.1  HGB 10.5*  HCT 30.9*  PLT 187   BMET  Basename 05/14/11 0305 05/13/11 0310  NA 135 136  K 3.4* 3.6  CL 96 95*  CO2 32 34*  GLUCOSE 341* 294*  BUN 29* 24*  CREATININE 0.80 0.69  CALCIUM 8.5 8.9    Studies/Results: No results found.  Medications: I have reviewed the patient's current medications.  Assessment/Plan: 1. Pulm - doing much better  Plan - tx to telem bed and educe iv solumedrol to improve confusion  Continue Tamiflu, Azithro Reduce solumedrol 60 mg bid today 2. Hypertension - better controlledPlan - continue hydralazine prn.  3. Encephalopathy - seems bright, alert, w/o confusion 4. Diabetes - cont SSNI    LOS: 4 days   [name] 05/14/2011, 7:33 AM

## 2011-05-14 NOTE — Progress Notes (Signed)
Pt's daughter is here visiting. Concerned about pts "mild confusion" and wants a note left for MD to call her after next assessment as well as to see if we could do a U/A to make sure she hasn't developed a UTI. Reassured the daughter a note will be left and pt's vitals were taken and regimen followed as ordered. Pt also concerned that pt was on a heart healthy diet vs carb diabetic diet. Diet changed to carb mod with heart healthy parameters. Will continue to monitor pt''s progress and address any family needs.

## 2011-05-14 NOTE — Progress Notes (Signed)
Pt successfully transferred to 1518 utilizing droplet precautions. Marcelino Duster, RN at bedside. Pt assisted into recliner to prepare for dinner. Oxygen connected, tele on. Meds and chart given to RN.

## 2011-05-14 NOTE — Progress Notes (Signed)
Patient refused 0000 dose of 8 units of Novolog insulin. She was reoriented and reeducated for the need. Refusal was witnessed by two RNs (myself and Kevan Ny). At 0400, she still refused but agreed to take it if her daughter assured it was correct. Her daughter was called and spoke with the patient, so the dose of 15 units Novolog was given. Patient is still expressing noncompliance thoughts.

## 2011-05-14 NOTE — Progress Notes (Signed)
Per orders, pt is being transferred to 1518. Report given to [facility].

## 2011-05-14 NOTE — Progress Notes (Signed)
Per Dr. Kevan Ny, will continue with foley for at least another 24 hrs for strict urine output monitoring and pt has decreased mobility due to oxygen saturations and respiratory status. Will re-evaluate the need tomorrow morning.

## 2011-05-14 NOTE — Progress Notes (Signed)
Inpatient Diabetes Program Recommendations  AACE/ADA: New Consensus Statement on Inpatient Glycemic Control (2009)  Target Ranges:  Prepandial:   less than 140 mg/dL      Peak postprandial:   less than 180 mg/dL (1-2 hours)      Critically ill patients:  140 - 180 mg/dL   Reason for Visit: Hyperglycemia  Inpatient Diabetes Program Recommendations Insulin - Basal: Add Lantus 20 units QHS HgbA1C: HgbA1C - 8.1%  Note: CBGs continue in 200 - 300s.

## 2011-05-15 ENCOUNTER — Inpatient Hospital Stay (HOSPITAL_COMMUNITY): Payer: Medicare Other

## 2011-05-15 DIAGNOSIS — J9600 Acute respiratory failure, unspecified whether with hypoxia or hypercapnia: Secondary | ICD-10-CM

## 2011-05-15 DIAGNOSIS — J441 Chronic obstructive pulmonary disease with (acute) exacerbation: Secondary | ICD-10-CM

## 2011-05-15 DIAGNOSIS — E876 Hypokalemia: Secondary | ICD-10-CM

## 2011-05-15 DIAGNOSIS — J111 Influenza due to unidentified influenza virus with other respiratory manifestations: Secondary | ICD-10-CM

## 2011-05-15 LAB — GLUCOSE, CAPILLARY
Glucose-Capillary: 109 mg/dL — ABNORMAL HIGH (ref 70–99)
Glucose-Capillary: 226 mg/dL — ABNORMAL HIGH (ref 70–99)
Glucose-Capillary: 141 mg/dL — ABNORMAL HIGH (ref 70–99)

## 2011-05-15 LAB — CBC
HCT: 31.2 % — ABNORMAL LOW (ref 36.0–46.0)
Hemoglobin: 10.4 g/dL — ABNORMAL LOW (ref 12.0–15.0)
MCH: 30.7 pg (ref 26.0–34.0)
MCHC: 33.3 g/dL (ref 30.0–36.0)
MCV: 92 fL (ref 78.0–100.0)
Platelets: 186 10*3/uL (ref 150–400)
RBC: 3.39 MIL/uL — ABNORMAL LOW (ref 3.87–5.11)
RDW: 13.5 % (ref 11.5–15.5)
WBC: 6.9 10*3/uL (ref 4.0–10.5)

## 2011-05-15 LAB — DIFFERENTIAL
Basophils Absolute: 0 10*3/uL (ref 0.0–0.1)
Basophils Relative: 0 % (ref 0–1)
Eosinophils Absolute: 0 10*3/uL (ref 0.0–0.7)
Eosinophils Relative: 0 % (ref 0–5)
Lymphocytes Relative: 6 % — ABNORMAL LOW (ref 12–46)
Lymphs Abs: 0.4 10*3/uL — ABNORMAL LOW (ref 0.7–4.0)
Monocytes Absolute: 0.5 10*3/uL (ref 0.1–1.0)
Monocytes Relative: 8 % (ref 3–12)
Neutro Abs: 5.9 10*3/uL (ref 1.7–7.7)
Neutrophils Relative %: 86 % — ABNORMAL HIGH (ref 43–77)

## 2011-05-15 LAB — BASIC METABOLIC PANEL
BUN: 32 mg/dL — ABNORMAL HIGH (ref 6–23)
CO2: 36 mEq/L — ABNORMAL HIGH (ref 19–32)
Calcium: 8.3 mg/dL — ABNORMAL LOW (ref 8.4–10.5)
Chloride: 98 mEq/L (ref 96–112)
Creatinine, Ser: 0.73 mg/dL (ref 0.50–1.10)
GFR calc Af Amer: 88 mL/min — ABNORMAL LOW (ref >90)
GFR calc non Af Amer: 76 mL/min — ABNORMAL LOW (ref >90)
Glucose, Bld: 168 mg/dL — ABNORMAL HIGH (ref 70–99)
Potassium: 3 mEq/L — ABNORMAL LOW (ref 3.5–5.1)
Sodium: 140 mEq/L (ref 135–145)

## 2011-05-15 LAB — PRO B NATRIURETIC PEPTIDE: Pro B Natriuretic peptide (BNP): 1101 pg/mL — ABNORMAL HIGH (ref 0–450)

## 2011-05-15 MED ORDER — INSULIN ASPART 100 UNIT/ML ~~LOC~~ SOLN
0.0000 [IU] | Freq: Three times a day (TID) | SUBCUTANEOUS | Status: DC
  Administered 2011-05-15: 7 [IU] via SUBCUTANEOUS
  Administered 2011-05-16: 11 [IU] via SUBCUTANEOUS
  Administered 2011-05-16: 3 [IU] via SUBCUTANEOUS
  Administered 2011-05-16 – 2011-05-17 (×2): 4 [IU] via SUBCUTANEOUS
  Administered 2011-05-17: 7 [IU] via SUBCUTANEOUS
  Administered 2011-05-18: 4 [IU] via SUBCUTANEOUS

## 2011-05-15 MED ORDER — INSULIN ASPART PROT & ASPART (70-30 MIX) 100 UNIT/ML ~~LOC~~ SUSP
30.0000 [IU] | Freq: Every day | SUBCUTANEOUS | Status: DC
  Administered 2011-05-16 – 2011-05-17 (×2): 30 [IU] via SUBCUTANEOUS
  Filled 2011-05-15: qty 3

## 2011-05-15 MED ORDER — METHYLPREDNISOLONE SODIUM SUCC 40 MG IJ SOLR
40.0000 mg | Freq: Two times a day (BID) | INTRAMUSCULAR | Status: DC
  Administered 2011-05-15 – 2011-05-16 (×2): 40 mg via INTRAVENOUS
  Filled 2011-05-15 (×4): qty 1

## 2011-05-15 MED ORDER — GUAIFENESIN ER 600 MG PO TB12
1200.0000 mg | ORAL_TABLET | Freq: Two times a day (BID) | ORAL | Status: DC
  Administered 2011-05-15 – 2011-05-18 (×6): 1200 mg via ORAL
  Filled 2011-05-15 (×7): qty 2

## 2011-05-15 MED ORDER — INSULIN ASPART PROT & ASPART (70-30 MIX) 100 UNIT/ML ~~LOC~~ SUSP
30.0000 [IU] | Freq: Once | SUBCUTANEOUS | Status: AC
  Administered 2011-05-15: 30 [IU] via SUBCUTANEOUS
  Filled 2011-05-15: qty 3

## 2011-05-15 MED ORDER — DM-GUAIFENESIN ER 30-600 MG PO TB12
1.0000 | ORAL_TABLET | Freq: Two times a day (BID) | ORAL | Status: DC
  Administered 2011-05-15: 1 via ORAL
  Filled 2011-05-15 (×2): qty 1

## 2011-05-15 MED ORDER — INSULIN ASPART 100 UNIT/ML ~~LOC~~ SOLN: 15.0000 [IU] | Freq: Once | SUBCUTANEOUS | Status: DC

## 2011-05-15 MED ORDER — SODIUM CHLORIDE 0.9 % IV SOLN: INTRAVENOUS | Status: DC

## 2011-05-15 MED ORDER — SODIUM CHLORIDE 0.9 % IV SOLN
INTRAVENOUS | Status: DC
  Administered 2011-05-15 – 2011-05-17 (×3): via INTRAVENOUS
  Filled 2011-05-15 (×7): qty 1000

## 2011-05-15 NOTE — Progress Notes (Signed)
CBGs today - 226, 460, 463, 333, 292.  Solumedrol decreasing.  70/30 insulin started today.  Will need 70/30 bid.  (Pt on 8 units acDinner meal at home)

## 2011-05-15 NOTE — Progress Notes (Signed)
Hospital Admission Note Date: 05/15/2011  Patient name: [name] Medical record number: [id number] Date of birth: [birth year] Age: 78 y.o. Gender: female PCP: Pearla Dubonnet, MD, MD   Pulmonary: Dr Sandrea Hughs  Medical Service: PCCM,MD  Brief Summary: Flu A despite flu shot (sick contact +), AECOPD, Baseline Gold stage 3 copd, ex-smoker, Chronic pred and o2 at home. Admitted 05/11/11 to the floor and transferred to ICU 12/29. PCCM consulted 12/29  Anti-infectives:  Anti-infectives     Start     Dose/Rate Route Frequency Ordered Stop   05/12/11 1000   azithromycin (ZITHROMAX) tablet 250 mg        250 mg Oral Daily 05/11/11 0637 05/15/11 0941   05/12/11 0800   vancomycin (VANCOCIN) IVPB 1000 mg/200 mL premix  Status:  Discontinued        1,000 mg 200 mL/hr over 60 Minutes Intravenous Every 24 hours 05/11/11 0850 05/13/11 1040   05/11/11 2345   oseltamivir (TAMIFLU) capsule 75 mg        75 mg Oral 2 times daily 05/11/11 2333 05/16/11 2159   05/11/11 1000   azithromycin (ZITHROMAX) tablet 500 mg        500 mg Oral Daily 05/11/11 0637 05/11/11 1023   05/11/11 0700   vancomycin (VANCOCIN) IVPB 1000 mg/200 mL premix        1,000 mg 200 mL/hr over 60 Minutes Intravenous  Once 05/11/11 0658 05/11/11 0850   05/11/11 0245   azithromycin (ZITHROMAX) 500 mg in dextrose 5 % 250 mL IVPB        500 mg 250 mL/hr over 60 Minutes Intravenous  Once 05/11/11 0244 05/11/11 0444         Best Practice/Protocols:  VTE Prophylaxis: Heparin (SQ)   Recent Labs  Basename 05/15/11 0530   PLT 186  ]  Key Events: 12/28 admit 12/29: moved to ICU. Several family concerns aboutquality of care on 12/28 on floor   Overnight: Doing much better. Normal mental status. Eating. Feels improved.  Daughter concerned about high BP, mobilisation & 'preventing  pna'   Filed Vitals:   05/15/11 0547  BP: 145/65  Pulse: 77  Temp: 98 F (36.7 C)  Resp: 18    EXAM: Gen: Obese Body mass index  is 25.70 kg/(m^2). cushingoid Neuro: Pleasant, well-nourished, in no distress,  normal affect.  ENT: No lesions,  mouth clear,  oropharynx clear, no postnasal drip Neck: No JVD, no TMG, no carotid bruits Lungs: Much improved. No crackles. Very little wheeze. No acc muscle use Cardiovascular: RRR, heart sounds normal, no murmur or gallops, no peripheral edema Abdomen: soft and NT, no HSM,  BS normal Musculoskeletal: No deformities, no cyanosis or clubbing Skin: Warm, no lesions or rashes  Labs:  Lab 05/15/11 0530 05/14/11 0305 05/13/11 0310 05/12/11 1214 05/12/11 0630 05/11/11 0720  NA 140 135 136 -- 139 136  K 3.0* 3.4* -- -- -- --  CL 98 96 95* -- 99 95*  CO2 36* 32 34* -- 34* 36*  GLUCOSE 168* 341* 294* -- 213* 230*  BUN 32* 29* 24* -- 21 27*  CREATININE 0.73 0.80 0.69 -- 0.69 1.18*  CALCIUM 8.3* 8.5 8.9 -- 8.5 8.5  MG -- -- -- 2.2 -- 1.7  PHOS -- -- -- 3.6 -- --    Lab 05/15/11 0530 05/12/11 0630 05/11/11 0720 05/10/11 2347 05/10/11 2331  HGB 10.4* 10.5* 10.9* 13.9 13.0  HCT 31.2* 30.9* 33.7* 41.0 39.1  WBC 6.9 7.1 10.1 -- 9.7  PLT 186 187 200 -- 222    Imaging results:  05/15/11>> CXR:  IMPRESSION:  1. Focal opacity right lung base could reflect pneumonia,  atelectasis, or scarring. No significant change in its appearance.  2. Probable small right pleural effusion.  3. Osteopenia with multiple thoracic spine compression fractures.    Assessment & Plan: Patient Active Hospital Problem List:  1. Influenza A with respiratory manifestations (05/12/2011)   Assessment: Influenza due to sick contacts despite flu shot. High risk candidate for mortality due to age and COPD . Improved 12/31   Plan: Agree with Tamiflu for 5 days and airborne isolation.  2. COPD exacerbation with Acute Respiratory Failure (05/11/2011)   Assessment: This the complication of influenza. Improved 12/30 with bipap --on Solumedrol 40mg  IV Q12H --on Zithromax po  3. COPD UNSPECIFIED (06/14/2007)    Assessment: Baseline Gold stage III COPD, oxygen dependent, prednisone dependent at baseline --on Albut/ Ipratropium NEB Q4H --on MucinexDM 600Bid> but still congested; rec ch to 1200mg Bid  4. Hypertension w/ hypokalemia>>   Assessment: Has new hypertension following start a steroid therapy. Improved 12/30 . Trop x 3 negative. BNP 700s --1/1> getting IV KCl from [facility]  5.  DM:  --BS as above> on SSI  5. Encephalopathy acute (05/12/2011)   Assessment: Due to AECOPD and flu. Normal on 12/30   Plan: Improving delirium. Avoid benzodiazepines  6. Global issues  Assessment: Significant family dissatisfaction with several issues and care on 05/11/2011.   Plan:   floor transfer 12/31, PT consult   Alroy Dust, MD 05/15/2011, 2:41 PM

## 2011-05-15 NOTE — Progress Notes (Signed)
Subjective: Alert oriented and sitting up eating breakfast. Very conversational which is likely secondary to IV steroids. Doing much better today. No complaints of increased shortness of breath. Cough still quite congested. She is bringing up some phlegm periodically  Objective: Weight change:   Intake/Output Summary (Last 24 hours) at 05/15/11 1006 Last data filed at 05/15/11 0821  Gross per 24 hour  Intake 1728.33 ml  Output   2000 ml  Net -271.67 ml   General appearance: alert and mildly confused, but better than yesterday. Sitting up and eating Neck: no JVD  Resp: Scattered mild rhonchi, and expiratory wheeze with cough. No rales Cardio: RRR  GI: Soft, bss nl  Extremities:no edema  Skin: Warm and dry  Neurologic: Mildly confused at times. Non focal     Lab Results:  Basename 05/15/11 0530 05/14/11 0305 05/12/11 1214  NA 140 135 --  K 3.0* 3.4* --  CL 98 96 --  CO2 36* 32 --  GLUCOSE 168* 341* --  BUN 32* 29* --  CREATININE 0.73 0.80 --  CALCIUM 8.3* 8.5 --  MG -- -- 2.2  PHOS -- -- 3.6    Basename 05/13/11 0310  AST 23  ALT 17  ALKPHOS 54  BILITOT 0.2*  PROT 6.6  ALBUMIN 3.0*   No results found for this basename: LIPASE:2,AMYLASE:2 in the last 72 hours  Basename 05/15/11 0530  WBC 6.9  NEUTROABS 5.9  HGB 10.4*  HCT 31.2*  MCV 92.0  PLT 186    Basename 05/13/11 0807 05/13/11 0006 05/12/11 1551  CKTOTAL 191* 206* 216*  CKMB 4.6* 5.3* 5.0*  CKMBINDEX -- -- --  TROPONINI <0.30 <0.30 <0.30   No components found with this basename: POCBNP:3 No results found for this basename: DDIMER:2 in the last 72 hours No results found for this basename: HGBA1C:2 in the last 72 hours No results found for this basename: CHOL:2,HDL:2,LDLCALC:2,TRIG:2,CHOLHDL:2,LDLDIRECT:2 in the last 72 hours No results found for this basename: TSH,T4TOTAL,FREET3,T3FREE,THYROIDAB in the last 72 hours No results found for this basename:  VITAMINB12:2,FOLATE:2,FERRITIN:2,TIBC:2,IRON:2,RETICCTPCT:2 in the last 72 hours  Studies/Results: No results found. Medications: Scheduled Meds:   . albuterol  2.5 mg Nebulization Q4H  . antiseptic oral rinse  15 mL Mouth Rinse q12n4p  . aspirin EC  81 mg Oral Daily  . azithromycin  250 mg Oral Daily  . chlorhexidine  15 mL Mouth Rinse BID  . clopidogrel  75 mg Oral Daily  . dextromethorphan-guaiFENesin  1 tablet Oral BID  . ferrous sulfate  325 mg Oral BID  . furosemide  60 mg Oral Daily  . heparin subcutaneous  5,000 Units Subcutaneous Q12H  . insulin aspart  0-15 Units Subcutaneous Q4H  . ipratropium  0.5 mg Nebulization Q4H  . levothyroxine  88 mcg Oral Daily  . methylPREDNISolone (SOLU-MEDROL) injection  40 mg Intravenous Q12H  . oseltamivir  75 mg Oral BID  . pantoprazole  40 mg Oral Q1200  . senna  2 tablet Oral BID  . sertraline  50 mg Oral Daily  . vitamin C  1,000 mg Oral Daily  . DISCONTD: methylPREDNISolone (SOLU-MEDROL) injection  60 mg Intravenous Q12H  . DISCONTD: methylPREDNISolone (SOLU-MEDROL) injection  80 mg Intravenous Q8H   Continuous Infusions:   . 0.9 % sodium chloride with kcl    . DISCONTD: sodium chloride 50 mL/hr at 05/15/11 0546  . DISCONTD: sodium chloride     PRN Meds:.acetaminophen, acetaminophen, albuterol, alum & mag hydroxide-simeth, ondansetron (ZOFRAN) IV, ondansetron, polyethylene glycol powder, sodium  chloride, DISCONTD: guaiFENesin-dextromethorphan, DISCONTD: hydrALAZINE  Assessment/Plan: Patient Active Problem List  Diagnoses Date Noted  . Influenza A with respiratory manifestations - much improved on Tamiflu  05/12/2011  . Hypertension - controlled  05/12/2011  . Encephalopathy acute - resolving. Multifactorial. Initially was probably secondary to flu and hypoxemia and fever. Further complicated by intravenous steroids which are being tapered. Much better today than yesterday. Interactive and knows me. Conversation very effusive,  likely secondary to IV steroids  05/12/2011  . COPD exacerbation - cough still with rhonchi but improved. No respiratory distress. Breathing up some phlegm occasionally  05/11/2011  . HYPOTHYROIDISM - aware  11/07/2007  . HYPERLIPIDEMIA - aware  06/26/2007  . OSTEOPOROSIS 06/26/2007  .  type 2 diabetes  - sliding scale insulin  06/26/2007  .  hypokalemia  -  Replace IV 06/14/2007  . GERD - continue PPI therapy  Physical therapy consult to continue Cloudy urine - check urine culture Remove Foley  06/13/2007     LOS: 5 days   Jamaury Gumz NEVILL 05/15/2011, 10:06 AM

## 2011-05-16 LAB — HEMOGLOBIN A1C: Hgb A1c MFr Bld: 8 % — ABNORMAL HIGH (ref <5.7)

## 2011-05-16 LAB — BASIC METABOLIC PANEL
CO2: 35 mEq/L — ABNORMAL HIGH (ref 19–32)
Calcium: 8.3 mg/dL — ABNORMAL LOW (ref 8.4–10.5)
Glucose, Bld: 286 mg/dL — ABNORMAL HIGH (ref 70–99)
Sodium: 136 mEq/L (ref 135–145)

## 2011-05-16 LAB — GLUCOSE, CAPILLARY
Glucose-Capillary: 144 mg/dL — ABNORMAL HIGH (ref 70–99)
Glucose-Capillary: 159 mg/dL — ABNORMAL HIGH (ref 70–99)
Glucose-Capillary: 368 mg/dL — ABNORMAL HIGH (ref 70–99)

## 2011-05-16 MED ORDER — PREDNISONE 20 MG PO TABS
40.0000 mg | ORAL_TABLET | Freq: Every day | ORAL | Status: DC
  Administered 2011-05-16 – 2011-05-18 (×3): 40 mg via ORAL
  Filled 2011-05-16 (×3): qty 2

## 2011-05-16 MED ORDER — INSULIN ASPART 100 UNIT/ML ~~LOC~~ SOLN
4.0000 [IU] | Freq: Once | SUBCUTANEOUS | Status: AC
  Administered 2011-05-16: 4 [IU] via SUBCUTANEOUS

## 2011-05-16 NOTE — Progress Notes (Signed)
Physical Therapy Evaluation Patient Details Name: [name] MRN: [id number] DOB: 05-25-1923 Today's Date: 05/16/2011 [id number] eval 2  Problem List:  Patient Active Problem List  Diagnoses  . HYPOTHYROIDISM  . HYPERLIPIDEMIA  . COPD UNSPECIFIED  . GERD  . OSTEOPOROSIS  . HYPERGLYCEMIA  . COPD exacerbation  . Influenza A with respiratory manifestations  . Hypertension  . Encephalopathy acute  . Hypokalemia    Past Medical History:  Past Medical History  Diagnosis Date  . Osteoporosis, unspecified     9 Compression Fx with slt cord deformity   . Other abnormal glucose   . Other and unspecified hyperlipidemia   . Esophageal reflux   . Chronic airway obstruction, not elsewhere classified     On home O2 and daily Prednisone since march 2009. PFTs 09/05/07 FEV1 49% with improvement by 8% ratio 39% and diffusing capacity 58%    . Unspecified hypothyroidism   . Tobacco abuse, in remission quit 2002  . Atelectasis of right lung     chronic rt lower lung seen on cT 2010   Past Surgical History:  Past Surgical History  Procedure Date  . Cholecystectomy   . Ankle fracture surgery     left 10/12/2008    PT Assessment/Plan/Recommendation PT Assessment Clinical Impression Statement: Pt will benefit from PT to maximize independence for HOME PT Recommendation/Assessment: Patient will need skilled PT in the acute care venue PT Problem List: Decreased activity tolerance;Decreased balance;Decreased mobility;Decreased knowledge of use of DME PT Therapy Diagnosis : Difficulty walking PT Plan PT Frequency: Min 3X/week PT Recommendation Follow Up Recommendations: Home health PT Equipment Recommended: Rolling walker with 5" wheels (petite RW) PT Goals  Acute Rehab PT Goals PT Goal Formulation: With patient Time For Goal Achievement: 7 days Pt will go Supine/Side to Sit: Independently PT Goal: Supine/Side to Sit - Progress: Progressing toward goal Pt will go Sit to  Supine/Side: Independently PT Goal: Sit to Supine/Side - Progress: Not met Pt will go Sit to Stand: with supervision PT Goal: Sit to Stand - Progress: Progressing toward goal Pt will go Stand to Sit: with supervision PT Goal: Stand to Sit - Progress: Progressing toward goal Pt will Ambulate: 51 - 150 feet;with supervision;with rolling walker PT Goal: Ambulate - Progress: Progressing toward goal Pt will Go Up / Down Stairs: 3-5 stairs;with min assist;with rail(s) PT Goal: Up/Down Stairs - Progress: Not met  PT Evaluation Precautions/Restrictions    Prior Functioning  Home Living Lives With: Family;Spouse Receives Help From:  (dtr and husban; dtr about to leave on mission trip to [employer]) Type of Home: House Home Layout: Two level Alternate Level Stairs-Rails:  (does not use upstairs) Home Access: Stairs to enter Entrance Stairs-Rails: Can reach both;Right;Left Entrance Stairs-Number of Steps: 5 Home Adaptive Equipment: Walker - rolling (pt states too large and not sturdy) she bought it at [employer] Prior Function Level of Independence: Requires assistive device for independence;Independent with basic ADLs (recently using RW for amb) Able to Take Stairs?: Yes Cognition Cognition Arousal/Alertness: Awake/alert Overall Cognitive Status: Appears within functional limits for tasks assessed Orientation Level: Oriented X4 Sensation/Coordination   Extremity Assessment RUE Assessment RUE Assessment: Within Functional Limits LUE Assessment LUE Assessment: Within Functional Limits RLE Assessment RLE Assessment:  (grossly WFL) LLE Assessment LLE Assessment:  (grossly WFL) Mobility (including Balance) Bed Mobility Bed Mobility: Yes Supine to Sit: 5: Supervision Supine to Sit Details (indicate cue type and reason): for safety Transfers Transfers: Yes Sit to Stand: 4: Min assist;From bed Sit  to Stand Details (indicate cue type and reason): cues for hands Stand to Sit: 4: Min  assist;To chair/3-in-1;With armrests;With upper extremity assist Stand to Sit Details: cues for hands Ambulation/Gait Ambulation/Gait: Yes Ambulation/Gait Assistance: 4: Min assist (second person for IV and O2) Ambulation/Gait Assistance Details (indicate cue type and reason): cues for safe use of RW Ambulation Distance (Feet): 75 Feet Assistive device: Rolling walker Gait Pattern: Step-through pattern;Scissoring Gait velocity: slow    Exercise    End of Session PT - End of Session Activity Tolerance: Patient limited by fatigue (mild DOE) Patient left: in chair;with call bell in reach General Behavior During Session: [facility] for tasks performed Cognition: [facility] for tasks performed  [facility] 05/16/2011, 10:48 AM

## 2011-05-16 NOTE — Progress Notes (Signed)
Hospital Admission Note Date: 05/16/2011  Patient name: [name] Medical record number: [id number] Date of birth: [birth year] Age: 78 y.o. Gender: female PCP: Pearla Dubonnet, MD, MD   Pulmonary: Dr Sandrea Hughs  Medical Service: PCCM,MD  Brief Summary: Flu A despite flu shot (sick contact +), AECOPD, Baseline Gold stage 3 copd, ex-smoker, Chronic pred and o2 at home. Admitted 05/11/11 to the floor and transferred to ICU 12/29. PCCM consulted 12/29  Anti-infectives:  Anti-infectives     Start     Dose/Rate Route Frequency Ordered Stop   05/12/11 1000   azithromycin (ZITHROMAX) tablet 250 mg        250 mg Oral Daily 05/11/11 0637 05/15/11 0941   05/12/11 0800   vancomycin (VANCOCIN) IVPB 1000 mg/200 mL premix  Status:  Discontinued        1,000 mg 200 mL/hr over 60 Minutes Intravenous Every 24 hours 05/11/11 0850 05/13/11 1040   05/11/11 2345   oseltamivir (TAMIFLU) capsule 75 mg        75 mg Oral 2 times daily 05/11/11 2333 05/16/11 2159   05/11/11 1000   azithromycin (ZITHROMAX) tablet 500 mg        500 mg Oral Daily 05/11/11 0637 05/11/11 1023   05/11/11 0700   vancomycin (VANCOCIN) IVPB 1000 mg/200 mL premix        1,000 mg 200 mL/hr over 60 Minutes Intravenous  Once 05/11/11 0658 05/11/11 0850   05/11/11 0245   azithromycin (ZITHROMAX) 500 mg in dextrose 5 % 250 mL IVPB        500 mg 250 mL/hr over 60 Minutes Intravenous  Once 05/11/11 0244 05/11/11 0444         Best Practice/Protocols:  VTE Prophylaxis: Heparin (SQ)   Recent Labs  Basename 05/15/11 0530   PLT 186  ]  Key Events: 12/28 admit 12/29: moved to ICU. Several family concerns aboutquality of care on 12/28 on floor   Overnight: Doing much better. Normal mental status. Eating. Feels improved.  12/31 Daughter concerned about high BP, mobilisation & 'preventing  pna' Sugars remain high   Filed Vitals:   05/16/11 0627  BP: 168/87  Pulse: 81  Temp: 97.2 F (36.2 C)  Resp: 20     EXAM: Gen: Obese Body mass index is 25.70 kg/(m^2). cushingoid Neuro: Pleasant, well-nourished, in no distress,  normal affect.  ENT: No lesions,  mouth clear,  oropharynx clear, no postnasal drip Neck: No JVD, no TMG, no carotid bruits Lungs: Much improved. No crackles. Faint scattered wheeze. No acc muscle use Cardiovascular: RRR, heart sounds normal, no murmur or gallops, no peripheral edema Abdomen: soft and NT, no HSM,  BS normal Musculoskeletal: No deformities, no cyanosis or clubbing Skin: Warm, no lesions or rashes  Labs:  Lab 05/16/11 0520 05/15/11 0530 05/14/11 0305 05/13/11 0310 05/12/11 1214 05/12/11 0630 05/11/11 0720  NA 136 140 135 136 -- 139 --  K 4.0 3.0* -- -- -- -- --  CL 97 98 96 95* -- 99 --  CO2 35* 36* 32 34* -- 34* --  GLUCOSE 286* 168* 341* 294* -- 213* --  BUN 28* 32* 29* 24* -- 21 --  CREATININE 0.67 0.73 0.80 0.69 -- 0.69 --  CALCIUM 8.3* 8.3* 8.5 8.9 -- 8.5 --  MG -- -- -- -- 2.2 -- 1.7  PHOS -- -- -- -- 3.6 -- --    Lab 05/15/11 0530 05/12/11 0630 05/11/11 0720 05/10/11 2347 05/10/11 2331  HGB 10.4* 10.5* 10.9*  13.9 13.0  HCT 31.2* 30.9* 33.7* 41.0 39.1  WBC 6.9 7.1 10.1 -- 9.7  PLT 186 187 200 -- 222    Imaging results:  05/15/11>> CXR:  IMPRESSION:  1. Focal opacity right lung base could reflect pneumonia,  atelectasis, or scarring. No significant change in its appearance.  2. Probable small right pleural effusion.  3. Osteopenia with multiple thoracic spine compression fractures.    Assessment & Plan:   1. Influenza A with respiratory manifestations (05/12/2011)   Assessment: Influenza due to sick contacts despite flu shot. High risk candidate for mortality due to age and COPD . Improved   Plan: Agree with Tamiflu for 5 days and airborne isolation.  2. COPD exacerbation with Acute Respiratory Failure (05/11/2011)   Assessment: This the complication of influenza. Improved 12/30 with bipap --dc Solumedrol , PO pred 40 mg - taper  over 2 weeks --on Zithromax po  3. COPD UNSPECIFIED (06/14/2007)   Assessment: Baseline Gold stage III COPD, oxygen dependent, prednisone dependent at baseline --on Albut/ Ipratropium NEB Q4H --on MucinexDM 600Bid> but still congested; rec ch to 1200mg Bid  4. Hypertension w/ hypokalemia>>   Assessment: Has new hypertension following start a steroid therapy. Improved 12/30 . Trop x 3 negative. BNP 700s --1/1> getting IV KCl from [facility]  5.  DM:  --BS as above> on SSI  5. Encephalopathy acute (05/12/2011)   Assessment: Due to AECOPD and flu. Normal on 12/30   Plan: Improving delirium. Avoid benzodiazepines  6. Global issues  Assessment: Significant family dissatisfaction with several issues and care on 05/11/2011.   Plan:   floor transfer 12/31, PT consult  PCCM to sign off, pl call as needed - out pt FU can be with Dr Kevan Ny & with [place] in 3-4 weeks Epect sugars to improve as steroids are lowered  Cyril Mourning MD. FCCP. [facility] Pager [phone] If no response call [phone]   05/16/2011, 9:26 AM

## 2011-05-16 NOTE — Progress Notes (Signed)
Subjective: Patient sleeping comfortably and awakened easily. Breathing is not distressed. Cough still with profuse or rhonchi.  Objective: Weight change:   Intake/Output Summary (Last 24 hours) at 05/16/11 0529 Last data filed at 05/15/11 2341  Gross per 24 hour  Intake 1508.33 ml  Output   2275 ml  Net -766.67 ml   Filed Vitals:   05/15/11 0547 05/15/11 0914 05/15/11 1950 05/15/11 2153  BP: 145/65   158/65  Pulse: 77   78  Temp: 98 F (36.7 C)   97.7 F (36.5 C)  TempSrc: Oral   Oral  Resp: 18   22  Height:      Weight:      SpO2:  98% 98% 98%   General appearance: alert and mildly confused, but better than yesterday. Sitting up and eating  Neck: no JVD  Resp: Scattered mild rhonchi, and expiratory wheeze with cough. No rales  Cardio: RRR  GI: Soft, bss nl  Extremities:no edema  Skin: Warm and dry  Neurologic: Mildly confused at times. Non focal  Lab Results:  Basename 05/15/11 0530 05/14/11 0305  NA 140 135  K 3.0* 3.4*  CL 98 96  CO2 36* 32  GLUCOSE 168* 341*  BUN 32* 29*  CREATININE 0.73 0.80  CALCIUM 8.3* 8.5  MG -- --  PHOS -- --   No results found for this basename: AST:2,ALT:2,ALKPHOS:2,BILITOT:2,PROT:2,ALBUMIN:2 in the last 72 hours No results found for this basename: LIPASE:2,AMYLASE:2 in the last 72 hours  Basename 05/15/11 0530  WBC 6.9  NEUTROABS 5.9  HGB 10.4*  HCT 31.2*  MCV 92.0  PLT 186    Basename 05/13/11 0807  CKTOTAL 191*  CKMB 4.6*  CKMBINDEX --  TROPONINI <0.30   No components found with this basename: POCBNP:3 No results found for this basename: DDIMER:2 in the last 72 hours  Basename 05/15/11 0530  HGBA1C 8.0*   No results found for this basename: CHOL:2,HDL:2,LDLCALC:2,TRIG:2,CHOLHDL:2,LDLDIRECT:2 in the last 72 hours No results found for this basename: TSH,T4TOTAL,FREET3,T3FREE,THYROIDAB in the last 72 hours No results found for this basename: VITAMINB12:2,FOLATE:2,FERRITIN:2,TIBC:2,IRON:2,RETICCTPCT:2 in the last  72 hours  Studies/Results: Dg Chest 2 View  05/15/2011  *RADIOLOGY REPORT*  Clinical Data: COPD, shortness of breath, and fever  CHEST - 2 VIEW  Comparison: 05/10/2011, 08/22/2010, 05/25/2010, 05/19/2010.  Findings: Stable mild cardiomegaly.  Stable tortuous thoracic aorta contour.  Pulmonary vascularity normal.  There is a patchy opacity the right lung base.  The right costophrenic angle is blunted laterally, for which a small amount of pleural fluid cannot be excluded. Costophrenic angle blunting is new.  Left lung is clear.  The diffuse mild chronic prominence of the interstitial lung markings stable.  Heavy atherosclerotic calcification of the visualized thoracoabdominal aorta and its branches  The bones are diffusely osteopenic. Multiple compression deformities of thoracic spine vertebral bodies is again noted.  Marked degenerative changes of the right shoulder noted.  IMPRESSION:  1.  Focal opacity right lung base could reflect pneumonia, atelectasis, or scarring.  No significant change in its appearance. 2.  Probable small right pleural effusion. 3.  Osteopenia with multiple thoracic spine compression fractures.  Original Report Authenticated By: Britta Mccreedy, M.D.   Medications: Scheduled Meds:   . albuterol  2.5 mg Nebulization Q4H  . antiseptic oral rinse  15 mL Mouth Rinse q12n4p  . aspirin EC  81 mg Oral Daily  . azithromycin  250 mg Oral Daily  . chlorhexidine  15 mL Mouth Rinse BID  . clopidogrel  75  mg Oral Daily  . ferrous sulfate  325 mg Oral BID  . furosemide  60 mg Oral Daily  . guaiFENesin  1,200 mg Oral BID  . heparin subcutaneous  5,000 Units Subcutaneous Q12H  . insulin aspart  0-20 Units Subcutaneous TID WC  . insulin aspart  15 Units Subcutaneous Once  . insulin aspart protamine-insulin aspart  30 Units Subcutaneous Q breakfast  . insulin aspart protamine-insulin aspart  30 Units Subcutaneous Once  . ipratropium  0.5 mg Nebulization Q4H  . levothyroxine  88 mcg Oral  Daily  . methylPREDNISolone (SOLU-MEDROL) injection  40 mg Intravenous Q12H  . oseltamivir  75 mg Oral BID  . pantoprazole  40 mg Oral Q1200  . senna  2 tablet Oral BID  . sertraline  50 mg Oral Daily  . vitamin C  1,000 mg Oral Daily  . DISCONTD: dextromethorphan-guaiFENesin  1 tablet Oral BID  . DISCONTD: insulin aspart  0-15 Units Subcutaneous Q4H  . DISCONTD: methylPREDNISolone (SOLU-MEDROL) injection  60 mg Intravenous Q12H   Continuous Infusions:   . 0.9 % sodium chloride with kcl 50 mL/hr at 05/15/11 1130  . DISCONTD: sodium chloride 50 mL/hr at 05/15/11 0546  . DISCONTD: sodium chloride     PRN Meds:.acetaminophen, acetaminophen, albuterol, alum & mag hydroxide-simeth, ondansetron (ZOFRAN) IV, ondansetron, polyethylene glycol powder, sodium chloride, DISCONTD: guaiFENesin-dextromethorphan, DISCONTD: hydrALAZINE  Assessment/Plan: Patient Active Problem List  Diagnoses  Date Noted  .  Influenza A with respiratory manifestations - much improved on Tamiflu  05/12/2011  .  Hypertension - controlled  05/12/2011  .  Encephalopathy acute - resolving. Multifactorial. Initially was probably secondary to flu and hypoxemia and fever. Further complicated by intravenous steroids which are being tapered. Continues to improve. Interactive and knows me. Conversation very effusive, likely secondary to IV steroids. Change to by mouth prednisone today 05/12/2011  .  COPD exacerbation - cough still with rhonchi but improved. No respiratory distress. Coughing up some phlegm occasionally  05/11/2011  .  HYPOTHYROIDISM - aware  11/07/2007  .  HYPERLIPIDEMIA - aware  06/26/2007  .  OSTEOPOROSIS  06/26/2007  .  type 2 diabetes - sliding scale insulin  06/26/2007  .  hypokalemia - Replace IV, basic metabolic profile pending 06/14/2007   . GERD - continue PPI therapy   Physical therapy consult to continue   Cloudy urine - urine culture pending     LOS: 6 days   [name]  NEVILL 05/16/2011, 5:29 AM

## 2011-05-17 DIAGNOSIS — N390 Urinary tract infection, site not specified: Secondary | ICD-10-CM | POA: Diagnosis not present

## 2011-05-17 LAB — GLUCOSE, CAPILLARY
Glucose-Capillary: 212 mg/dL — ABNORMAL HIGH (ref 70–99)
Glucose-Capillary: 181 mg/dL — ABNORMAL HIGH (ref 70–99)
Glucose-Capillary: 210 mg/dL — ABNORMAL HIGH (ref 70–99)
Glucose-Capillary: 176 mg/dL — ABNORMAL HIGH (ref 70–99)

## 2011-05-17 LAB — DIFFERENTIAL
Basophils Absolute: 0.1 10*3/uL (ref 0.0–0.1)
Eosinophils Absolute: 0 10*3/uL (ref 0.0–0.7)
Lymphocytes Relative: 17 % (ref 12–46)
Neutrophils Relative %: 72 % (ref 43–77)

## 2011-05-17 LAB — CBC
Hemoglobin: 11.5 g/dL — ABNORMAL LOW (ref 12.0–15.0)
MCHC: 33.3 g/dL (ref 30.0–36.0)
Platelets: 255 10*3/uL (ref 150–400)
RDW: 13.2 % (ref 11.5–15.5)

## 2011-05-17 LAB — URINE CULTURE: Culture  Setup Time: 201301020006

## 2011-05-17 LAB — CULTURE, BLOOD (ROUTINE X 2)
Culture  Setup Time: 201212280857
Culture: NO GROWTH

## 2011-05-17 LAB — BASIC METABOLIC PANEL
BUN: 26 mg/dL — ABNORMAL HIGH (ref 6–23)
Calcium: 9.1 mg/dL (ref 8.4–10.5)
Creatinine, Ser: 0.72 mg/dL (ref 0.50–1.10)
GFR calc Af Amer: 88 mL/min — ABNORMAL LOW (ref >90)
GFR calc non Af Amer: 76 mL/min — ABNORMAL LOW (ref >90)
Potassium: 3.7 mEq/L (ref 3.5–5.1)

## 2011-05-17 MED ORDER — INSULIN ASPART 100 UNIT/ML ~~LOC~~ SOLN
12.0000 [IU] | Freq: Once | SUBCUTANEOUS | Status: AC
  Administered 2011-05-17: 12 [IU] via SUBCUTANEOUS

## 2011-05-17 MED ORDER — AMLODIPINE BESYLATE 2.5 MG PO TABS
2.5000 mg | ORAL_TABLET | Freq: Once | ORAL | Status: AC
  Administered 2011-05-17: 2.5 mg via ORAL
  Filled 2011-05-17: qty 1

## 2011-05-17 MED ORDER — INSULIN ASPART PROT & ASPART (70-30 MIX) 100 UNIT/ML ~~LOC~~ SUSP
15.0000 [IU] | Freq: Every day | SUBCUTANEOUS | Status: DC
  Administered 2011-05-17: 15 [IU] via SUBCUTANEOUS

## 2011-05-17 MED ORDER — INSULIN ASPART PROT & ASPART (70-30 MIX) 100 UNIT/ML ~~LOC~~ SUSP
35.0000 [IU] | Freq: Every day | SUBCUTANEOUS | Status: DC
Start: 2011-05-18 — End: 2011-05-18
  Administered 2011-05-18: 35 [IU] via SUBCUTANEOUS

## 2011-05-17 MED ORDER — DEXTROSE 5 % IV SOLN
1.0000 g | Freq: Once | INTRAVENOUS | Status: AC
  Administered 2011-05-17: 1 g via INTRAVENOUS
  Filled 2011-05-17: qty 10

## 2011-05-17 NOTE — Progress Notes (Signed)
Subjective: Still with congested cough. Ambulating. Slowly feeling better. Prefers to go home instead of to a nursing home. We'll plan for discharge home tomorrow if she has a stable day today. Blood pressure was elevated last night. Sugars still out of control  Objective: Weight change:   Intake/Output Summary (Last 24 hours) at 05/17/11 0556 Last data filed at 05/16/11 2200  Gross per 24 hour  Intake   1160 ml  Output   2700 ml  Net  -1540 ml   Filed Vitals:   05/17/11 0046 05/17/11 0115 05/17/11 0448 05/17/11 0529  BP:  171/82  138/65  Pulse:    81  Temp:    98.1 F (36.7 C)  TempSrc:    Oral  Resp:    20  Height:      Weight:      SpO2: 88%  98% 98%   General appearance: alert and confusion has resolved.  Sleeping but easily aroused and normally conversive  Neck: no JVD  Resp: Scattered moderate rhonchi with cough, and expiratory wheeze with cough. No rales  Cardio: RRR  GI: Soft, bss nl  Extremities:no edema  Skin: Warm and dry  Neurologic: Mildly confused at times. Non focal    Lab Results:  Basename 05/16/11 0520 05/15/11 0530  NA 136 140  K 4.0 3.0*  CL 97 98  CO2 35* 36*  GLUCOSE 286* 168*  BUN 28* 32*  CREATININE 0.67 0.73  CALCIUM 8.3* 8.3*  MG -- --  PHOS -- --   No results found for this basename: AST:2,ALT:2,ALKPHOS:2,BILITOT:2,PROT:2,ALBUMIN:2 in the last 72 hours No results found for this basename: LIPASE:2,AMYLASE:2 in the last 72 hours  Basename 05/15/11 0530  WBC 6.9  NEUTROABS 5.9  HGB 10.4*  HCT 31.2*  MCV 92.0  PLT 186   No results found for this basename: CKTOTAL:3,CKMB:3,CKMBINDEX:3,TROPONINI:3 in the last 72 hours No components found with this basename: POCBNP:3 No results found for this basename: DDIMER:2 in the last 72 hours  Basename 05/15/11 0530  HGBA1C 8.0*   No results found for this basename: CHOL:2,HDL:2,LDLCALC:2,TRIG:2,CHOLHDL:2,LDLDIRECT:2 in the last 72 hours No results found for this basename:  TSH,T4TOTAL,FREET3,T3FREE,THYROIDAB in the last 72 hours No results found for this basename: VITAMINB12:2,FOLATE:2,FERRITIN:2,TIBC:2,IRON:2,RETICCTPCT:2 in the last 72 hours  Studies/Results: Dg Chest 2 View  05/15/2011  *RADIOLOGY REPORT*  Clinical Data: COPD, shortness of breath, and fever  CHEST - 2 VIEW  Comparison: 05/10/2011, 08/22/2010, 05/25/2010, 05/19/2010.  Findings: Stable mild cardiomegaly.  Stable tortuous thoracic aorta contour.  Pulmonary vascularity normal.  There is a patchy opacity the right lung base.  The right costophrenic angle is blunted laterally, for which a small amount of pleural fluid cannot be excluded. Costophrenic angle blunting is new.  Left lung is clear.  The diffuse mild chronic prominence of the interstitial lung markings stable.  Heavy atherosclerotic calcification of the visualized thoracoabdominal aorta and its branches  The bones are diffusely osteopenic. Multiple compression deformities of thoracic spine vertebral bodies is again noted.  Marked degenerative changes of the right shoulder noted.  IMPRESSION:  1.  Focal opacity right lung base could reflect pneumonia, atelectasis, or scarring.  No significant change in its appearance. 2.  Probable small right pleural effusion. 3.  Osteopenia with multiple thoracic spine compression fractures.  Original Report Authenticated By: Britta Mccreedy, M.D.   Medications: Scheduled Meds:   . albuterol  2.5 mg Nebulization Q4H  . amLODipine  2.5 mg Oral Once  . antiseptic oral rinse  15 mL Mouth  Rinse q12n4p  . aspirin EC  81 mg Oral Daily  . chlorhexidine  15 mL Mouth Rinse BID  . clopidogrel  75 mg Oral Daily  . ferrous sulfate  325 mg Oral BID  . furosemide  60 mg Oral Daily  . guaiFENesin  1,200 mg Oral BID  . heparin subcutaneous  5,000 Units Subcutaneous Q12H  . insulin aspart  0-20 Units Subcutaneous TID WC  . insulin aspart  4 Units Subcutaneous Once  . insulin aspart protamine-insulin aspart  30 Units  Subcutaneous Q breakfast  . ipratropium  0.5 mg Nebulization Q4H  . levothyroxine  88 mcg Oral Daily  . oseltamivir  75 mg Oral BID  . pantoprazole  40 mg Oral Q1200  . predniSONE  40 mg Oral Q breakfast  . senna  2 tablet Oral BID  . sertraline  50 mg Oral Daily  . vitamin C  1,000 mg Oral Daily  . DISCONTD: insulin aspart  15 Units Subcutaneous Once  . DISCONTD: methylPREDNISolone (SOLU-MEDROL) injection  40 mg Intravenous Q12H   Continuous Infusions:   . 0.9 % sodium chloride with kcl 40 mL/hr at 05/16/11 1500   PRN Meds:.acetaminophen, albuterol, alum & mag hydroxide-simeth, polyethylene glycol powder, sodium chloride, DISCONTD: acetaminophen, DISCONTD: ondansetron (ZOFRAN) IV, DISCONTD: ondansetron  Assessment/Plan:  Patient Active Problem List  Diagnoses  Date Noted  .  Influenza A with respiratory manifestations - much improved on Tamiflu  05/12/2011  .  Hypertension - controlled  05/12/2011  .  Encephalopathy acute - resolved. Multifactorial. Initially was probably secondary to flu and hypoxemia and fever. Further complicated by intravenous steroids which are being tapered.  Interactive and knows me. 05/12/2011  .  COPD exacerbation - cough still with rhonchi but improved. No respiratory distress. Coughing up some phlegm occasionally. Continue prednisone and likely discharge tomorrow  05/11/2011  .  HYPOTHYROIDISM - aware  11/07/2007  .  HYPERLIPIDEMIA - aware  06/26/2007  .  OSTEOPOROSIS  06/26/2007  .  type 2 diabetes - sliding scale insulin  06/26/2007  .  hypokalemia - resolved  06/14/2007  . GERD - continue PPI therapy   Physical therapy consult to continue   UTI - culture pending. Greater than 100,000 gram-negative rods. We'll give Rocephin 1 g IV x1 until culture complete and can make a choice of oral antibiotic therapy   LOS: 7 days   Maleny Candy NEVILL 05/17/2011, 5:56 AM

## 2011-05-18 LAB — GLUCOSE, CAPILLARY: Glucose-Capillary: 323 mg/dL — ABNORMAL HIGH (ref 70–99)

## 2011-05-18 MED ORDER — PREDNISONE 10 MG PO TABS: ORAL_TABLET | ORAL | Status: DC

## 2011-05-18 MED ORDER — GUAIFENESIN ER 600 MG PO TB12: 600.0000 mg | ORAL_TABLET | Freq: Two times a day (BID) | ORAL | Status: DC

## 2011-05-18 MED ORDER — CEPHALEXIN 250 MG PO CAPS: 500.0000 mg | ORAL_CAPSULE | Freq: Three times a day (TID) | ORAL | Status: DC

## 2011-05-18 NOTE — Progress Notes (Signed)
PT Cancellation Note  ___Treatment cancelled today due to medical issues with patient which prohibited   therapy  ___ Treatment cancelled today due to patient receiving procedure or test   ___ Treatment cancelled today due to patient's refusal to participate   _X_ Treatment cancelled today...Michelle KitchenMarland Mcguire patient just finished getting dressed to D/C to home today and requested to rest. Felecia Shelling PTA [facility]  Acute  Rehab Pager     [phone]

## 2011-05-18 NOTE — Progress Notes (Signed)
Hypoglycemia protocol initiated... CBG 66 at 1127, card snack given of graham crackers and 8oz of orange juice and pt blood sugar increased to 84

## 2011-05-18 NOTE — Discharge Summary (Signed)
Physician Discharge Summary  NAME:[name]  ZOX:[id number]  DOB: [birth year]   Admit date: 05/10/2011 Discharge date: 05/18/2011  Discharge Diagnoses:  Active Problems: Influenza A COPD UNSPECIFIED COPD exacerbation Hypertension UTI (lower urinary tract infection)   Discharge Condition: Much improved  Hospital Course:  [name] is a very pleasant 78 year old female with oxygen-dependent COPD presented with fever and nasal swab positive influenza A. She was very confused on the night of admission and temperature in the emergency room was 103.5. She was started on azithromycin and continued on oxygen and nebulizer therapy as well as intravenous steroids. Her condition slowly improved through the week. She was found to have an Escherichia coli and Klebsiella UTI sensitive to fluconazole and then she will be discharged on cephalexin for 7 days. Condition on discharge much improved   Consults: Pulmonary critical care Disposition:  Discharged home in improved condition  Discharge Orders    Future Appointments: Provider: Department: Dept Phone: Center:   05/28/2011 12:00 PM Sandrea Hughs, MD [facility] [phone] None     Future Orders Please Complete By Expires   Diet - low sodium heart healthy      Increase activity slowly      Discharge instructions      Comments:   Increase activity slowly. Notify M.D. if temperature greater than 100F orally or if severe shortness of breath recurs   Call MD for:  temperature >100.4      Call MD for:  difficulty breathing, headache or visual disturbances      (HEART FAILURE PATIENTS) Call MD:  Anytime you have any of the following symptoms: 1) 3 pound weight gain in 24 hours or 5 pounds in 1 week 2) shortness of breath, with or without a dry hacking cough 3) swelling in the hands, feet or stomach 4) if you have to sleep on extra pillows at night in order to breathe.      Call MD for:      Scheduling Instructions:   Call M.D.  for appointment in 2 weeks     Current Discharge Medication List    START taking these medications   Details  cephALEXin (KEFLEX) 250 MG capsule Take 2 capsules (500 mg total) by mouth 3 (three) times daily. Qty: 21 capsule, Refills: 0    guaiFENesin (MUCINEX) 600 MG 12 hr tablet Take 1 tablet (600 mg total) by mouth 2 (two) times daily. Qty: 60 tablet, Refills: 1      CONTINUE these medications which have CHANGED   Details  predniSONE (DELTASONE) 10 MG tablet Take 4 tablets daily for 3 days then 3 tablets daily for 3 days then 2 tablets daily for 3 days and then one tablet daily chronically Qty: 100 tablet, Refills: 1      CONTINUE these medications which have NOT CHANGED   Details  albuterol (PROVENTIL HFA) 108 (90 BASE) MCG/ACT inhaler Inhale 2 puffs into the lungs every 4 (four) hours as needed for wheezing.    aspirin 81 MG tablet Take 81 mg by mouth daily.     budesonide (PULMICORT) 0.25 MG/2ML nebulizer solution Take 0.25 mg by nebulization daily.      calcium citrate-vitamin D (CITRACAL+D) 315-200 MG-UNIT per tablet Take 2 tablets by mouth daily.      Cholecalciferol (VITAMIN D3) 1000 UNITS tablet Take 2,000 Units by mouth 2 (two) times daily.     clopidogrel (PLAVIX) 75 MG tablet Take 75 mg by mouth daily.      diazepam (VALIUM)  5 MG tablet Take 2.5 mg by mouth 2 (two) times daily as needed.     doxycycline (VIBRA-TABS) 100 MG tablet Take 2 today then 1 daily until gone Qty: 8 tablet, Refills: 0    ferrous sulfate 325 (65 FE) MG EC tablet Take 1 tablet (325 mg total) by mouth 2 (two) times daily.    furosemide (LASIX) 40 MG tablet Take 60 mg by mouth daily. Take 1 and 1/2 tabs by mouth once daily    insulin aspart (NOVOLOG) 100 UNIT/ML injection Inject 5-10 Units into the skin 3 (three) times daily before meals. Sliding scale noon meal and 10 units at dinner as directed    insulin lispro protamine-insulin lispro (HUMALOG 75/25) (75-25) 100 UNIT/ML SUSP Inject 8-28  Units into the skin 2 (two) times daily with a meal. 25-28 units Every morning and 8 units every evening    ipratropium-albuterol (DUONEB) 0.5-2.5 (3) MG/3ML SOLN Take 3 mLs by nebulization every 6 (six) hours as needed. Shortness of breath     levothyroxine (SYNTHROID, LEVOTHROID) 88 MCG tablet Take 88 mcg by mouth daily.      Magnesium 250 MG TABS Take 1 tablet by mouth daily.      omeprazole (PRILOSEC) 20 MG capsule Take 1 capsule by mouth 2 (two) times daily before a meal.    polyethylene glycol powder (MIRALAX) powder Take 17 g by mouth daily as needed.      Potassium Gluconate 595 MG CAPS 1 tablet by mouth once daily    senna (SENOKOT) 8.6 MG tablet Take 2 tablets by mouth 2 (two) times daily.     sertraline (ZOLOFT) 50 MG tablet Take 50 mg by mouth daily.     sodium chloride (OCEAN) 0.65 % nasal spray Place 2 sprays into the nose 2 (two) times daily as needed for congestion.    tiotropium (SPIRIVA) 18 MCG inhalation capsule Place 18 mcg into inhaler and inhale daily.      vitamin C (ASCORBIC ACID) 500 MG tablet Take 1,000 mg by mouth daily.         Follow-up Information    Follow up with Emerson Schreifels NEVILL, MD .         Things to follow up in the outpatient setting: Monitor for increasing shortness of breath or fever or confusion  Time coordinating discharge: 35 minutes  The results of significant diagnostics from this hospitalization (including imaging, microbiology, ancillary and laboratory) are listed below for reference.    Significant Diagnostic Studies: Dg Chest 2 View  05/15/2011  *RADIOLOGY REPORT*  Clinical Data: COPD, shortness of breath, and fever  CHEST - 2 VIEW  Comparison: 05/10/2011, 08/22/2010, 05/25/2010, 05/19/2010.  Findings: Stable mild cardiomegaly.  Stable tortuous thoracic aorta contour.  Pulmonary vascularity normal.  There is a patchy opacity the right lung base.  The right costophrenic angle is blunted laterally, for which a small amount of  pleural fluid cannot be excluded. Costophrenic angle blunting is new.  Left lung is clear.  The diffuse mild chronic prominence of the interstitial lung markings stable.  Heavy atherosclerotic calcification of the visualized thoracoabdominal aorta and its branches  The bones are diffusely osteopenic. Multiple compression deformities of thoracic spine vertebral bodies is again noted.  Marked degenerative changes of the right shoulder noted.  IMPRESSION:  1.  Focal opacity right lung base could reflect pneumonia, atelectasis, or scarring.  No significant change in its appearance. 2.  Probable small right pleural effusion. 3.  Osteopenia with multiple thoracic spine compression  fractures.  Original Report Authenticated By: Britta Mccreedy, M.D.   Dg Chest 2 View  05/11/2011  *RADIOLOGY REPORT*  Clinical Data: Cough  CHEST - 2 VIEW  Comparison: 08/22/2010  Findings: Moderate cardiac enlargement.  No pleural effusion identified.  Scar and/or linear atelectasis is noted in the right base.  No airspace consolidation identified.  Chronic bronchitic changes are noted bilaterally.  The bones are diffusely osteopenic and there are multilevel compression deformities noted throughout the thoracic spine. Unchanged since the previous study.  Advanced osteoarthritis involves the right glenohumeral joint.  IMPRESSION: 1. Right base scar and/or atelectasis. 2.  Bronchitic changes.  Original Report Authenticated By: Rosealee Albee, M.D.    Microbiology: Recent Results (from the past 240 hour(s))  URINE CULTURE     Status: Normal   Collection Time   05/10/11  9:39 PM      Component Value Range Status Comment   Specimen Description URINE, CATHETERIZED   Final    Special Requests NONE   Final    Setup Time [id number]   Final    Colony Count NO GROWTH   Final    Culture NO GROWTH   Final    Report Status 05/11/2011 FINAL   Final   CULTURE, BLOOD (ROUTINE X 2)     Status: Normal   Collection Time   05/11/11  3:06 AM        Component Value Range Status Comment   Specimen Description BLOOD LEFT ANTECUBITAL   Final    Special Requests BOTTLES DRAWN AEROBIC AND ANAEROBIC [facility]   Final    Setup Time [id number]   Final    Culture NO GROWTH 5 DAYS   Final    Report Status 05/17/2011 FINAL   Final   CULTURE, BLOOD (ROUTINE X 2)     Status: Normal   Collection Time   05/11/11  3:10 AM      Component Value Range Status Comment   Specimen Description BLOOD RIGHT ANTECUBITAL   Final    Special Requests BOTTLES DRAWN AEROBIC AND ANAEROBIC 5CC   Final    Setup Time [id number]   Final    Culture NO GROWTH 5 DAYS   Final    Report Status 05/17/2011 FINAL   Final   MRSA PCR SCREENING     Status: Normal   Collection Time   05/12/11 11:22 AM      Component Value Range Status Comment   MRSA by PCR NEGATIVE  NEGATIVE  Final   URINE CULTURE     Status: Normal   Collection Time   05/15/11 11:38 AM      Component Value Range Status Comment   Specimen Description URINE, CATHETERIZED   Final    Special Requests NONE   Final    Setup Time [id number]   Final    Colony Count >=100,000 COLONIES/ML   Final    Culture     Final    Value: ESCHERICHIA COLI     KLEBSIELLA PNEUMONIAE   Report Status 05/17/2011 FINAL   Final    Organism ID, Bacteria ESCHERICHIA COLI   Final    Organism ID, Bacteria KLEBSIELLA PNEUMONIAE   Final      Labs: Results for orders placed during the hospital encounter of 05/10/11  URINE CULTURE      Component Value Range   Specimen Description URINE, CATHETERIZED     Special Requests NONE     Setup Time [id number]     Colony Count NO  GROWTH     Culture NO GROWTH     Report Status 05/11/2011 FINAL    URINALYSIS, ROUTINE W REFLEX MICROSCOPIC      Component Value Range   Color, Urine YELLOW  YELLOW    APPearance CLEAR  CLEAR    Specific Gravity, Urine 1.012  1.005 - 1.030    pH 6.0  5.0 - 8.0    Glucose, UA NEGATIVE  NEGATIVE (mg/dL)   Hgb urine dipstick NEGATIVE  NEGATIVE    Bilirubin  Urine NEGATIVE  NEGATIVE    Ketones, ur NEGATIVE  NEGATIVE (mg/dL)   Protein, ur NEGATIVE  NEGATIVE (mg/dL)   Urobilinogen, UA 0.2  0.0 - 1.0 (mg/dL)   Nitrite NEGATIVE  NEGATIVE    Leukocytes, UA NEGATIVE  NEGATIVE   GLUCOSE, CAPILLARY      Component Value Range   Glucose-Capillary 92  70 - 99 (mg/dL)  CBC      Component Value Range   WBC 9.7  4.0 - 10.5 (K/uL)   RBC 4.14  3.87 - 5.11 (MIL/uL)   Hemoglobin 13.0  12.0 - 15.0 (g/dL)   HCT 16.1  09.6 - 04.5 (%)   MCV 94.4  78.0 - 100.0 (fL)   MCH 31.4  26.0 - 34.0 (pg)   MCHC 33.2  30.0 - 36.0 (g/dL)   RDW 40.9  81.1 - 91.4 (%)   Platelets 222  150 - 400 (K/uL)  DIFFERENTIAL      Component Value Range   Neutrophils Relative 77  43 - 77 (%)   Neutro Abs 7.5  1.7 - 7.7 (K/uL)   Lymphocytes Relative 10 (*) 12 - 46 (%)   Lymphs Abs 1.0  0.7 - 4.0 (K/uL)   Monocytes Relative 11  3 - 12 (%)   Monocytes Absolute 1.1 (*) 0.1 - 1.0 (K/uL)   Eosinophils Relative 1  0 - 5 (%)   Eosinophils Absolute 0.1  0.0 - 0.7 (K/uL)   Basophils Relative 1  0 - 1 (%)   Basophils Absolute 0.1  0.0 - 0.1 (K/uL)  POCT I-STAT, CHEM 8      Component Value Range   Sodium 139  135 - 145 (mEq/L)   Potassium 5.1  3.5 - 5.1 (mEq/L)   Chloride 95 (*) 96 - 112 (mEq/L)   BUN 36 (*) 6 - 23 (mg/dL)   Creatinine, Ser 7.82  0.50 - 1.10 (mg/dL)   Glucose, Bld 956 (*) 70 - 99 (mg/dL)   Calcium, Ion 2.13  0.86 - 1.32 (mmol/L)   TCO2 40  0 - 100 (mmol/L)   Hemoglobin 13.9  12.0 - 15.0 (g/dL)   HCT 57.8  46.9 - 62.9 (%)  POCT I-STAT TROPONIN I      Component Value Range   Troponin i, poc 0.02  0.00 - 0.08 (ng/mL)   Comment 3           CULTURE, BLOOD (ROUTINE X 2)      Component Value Range   Specimen Description BLOOD RIGHT ANTECUBITAL     Special Requests BOTTLES DRAWN AEROBIC AND ANAEROBIC 5CC     Setup Time [id number]     Culture NO GROWTH 5 DAYS     Report Status 05/17/2011 FINAL    CULTURE, BLOOD (ROUTINE X 2)      Component Value Range   Specimen  Description BLOOD LEFT ANTECUBITAL     Special Requests BOTTLES DRAWN AEROBIC AND ANAEROBIC 9CC     Setup Time [id number]  Culture NO GROWTH 5 DAYS     Report Status 05/17/2011 FINAL    COMPREHENSIVE METABOLIC PANEL      Component Value Range   Sodium 136  135 - 145 (mEq/L)   Potassium 4.1  3.5 - 5.1 (mEq/L)   Chloride 95 (*) 96 - 112 (mEq/L)   CO2 36 (*) 19 - 32 (mEq/L)   Glucose, Bld 230 (*) 70 - 99 (mg/dL)   BUN 27 (*) 6 - 23 (mg/dL)   Creatinine, Ser 5.78 (*) 0.50 - 1.10 (mg/dL)   Calcium 8.5  8.4 - 46.9 (mg/dL)   Total Protein 6.2  6.0 - 8.3 (g/dL)   Albumin 3.0 (*) 3.5 - 5.2 (g/dL)   AST 20  0 - 37 (U/L)   ALT 13  0 - 35 (U/L)   Alkaline Phosphatase 56  39 - 117 (U/L)   Total Bilirubin 0.3  0.3 - 1.2 (mg/dL)   GFR calc non Af Amer 41 (*) >90 (mL/min)   GFR calc Af Amer 47 (*) >90 (mL/min)  MAGNESIUM      Component Value Range   Magnesium 1.7  1.5 - 2.5 (mg/dL)  CBC      Component Value Range   WBC 10.1  4.0 - 10.5 (K/uL)   RBC 3.54 (*) 3.87 - 5.11 (MIL/uL)   Hemoglobin 10.9 (*) 12.0 - 15.0 (g/dL)   HCT 62.9 (*) 52.8 - 46.0 (%)   MCV 95.2  78.0 - 100.0 (fL)   MCH 30.8  26.0 - 34.0 (pg)   MCHC 32.3  30.0 - 36.0 (g/dL)   RDW 41.3  24.4 - 01.0 (%)   Platelets 200  150 - 400 (K/uL)  DIFFERENTIAL      Component Value Range   Neutrophils Relative 78 (*) 43 - 77 (%)   Neutro Abs 7.9 (*) 1.7 - 7.7 (K/uL)   Lymphocytes Relative 12  12 - 46 (%)   Lymphs Abs 1.2  0.7 - 4.0 (K/uL)   Monocytes Relative 9  3 - 12 (%)   Monocytes Absolute 0.9  0.1 - 1.0 (K/uL)   Eosinophils Relative 1  0 - 5 (%)   Eosinophils Absolute 0.1  0.0 - 0.7 (K/uL)   Basophils Relative 1  0 - 1 (%)   Basophils Absolute 0.1  0.0 - 0.1 (K/uL)  INFLUENZA PANEL BY PCR      Component Value Range   Influenza A By PCR POSITIVE (*) NEGATIVE    Influenza B By PCR NEGATIVE  NEGATIVE    H1N1 flu by pcr NOT DETECTED  NOT DETECTED   GLUCOSE, CAPILLARY      Component Value Range   Glucose-Capillary 261  (*) 70 - 99 (mg/dL)   Comment 1 Notify RN     Comment 2 Documented in Chart    GLUCOSE, CAPILLARY      Component Value Range   Glucose-Capillary 219 (*) 70 - 99 (mg/dL)  GLUCOSE, CAPILLARY      Component Value Range   Glucose-Capillary 285 (*) 70 - 99 (mg/dL)  BASIC METABOLIC PANEL      Component Value Range   Sodium 139  135 - 145 (mEq/L)   Potassium 3.8  3.5 - 5.1 (mEq/L)   Chloride 99  96 - 112 (mEq/L)   CO2 34 (*) 19 - 32 (mEq/L)   Glucose, Bld 213 (*) 70 - 99 (mg/dL)   BUN 21  6 - 23 (mg/dL)   Creatinine, Ser 2.72  0.50 - 1.10 (mg/dL)  Calcium 8.5  8.4 - 10.5 (mg/dL)   GFR calc non Af Amer 77 (*) >90 (mL/min)   GFR calc Af Amer 89 (*) >90 (mL/min)  CBC      Component Value Range   WBC 7.1  4.0 - 10.5 (K/uL)   RBC 3.37 (*) 3.87 - 5.11 (MIL/uL)   Hemoglobin 10.5 (*) 12.0 - 15.0 (g/dL)   HCT 47.8 (*) 29.5 - 46.0 (%)   MCV 91.7  78.0 - 100.0 (fL)   MCH 31.2  26.0 - 34.0 (pg)   MCHC 34.0  30.0 - 36.0 (g/dL)   RDW 62.1  30.8 - 65.7 (%)   Platelets 187  150 - 400 (K/uL)  DIFFERENTIAL      Component Value Range   Neutrophils Relative 89 (*) 43 - 77 (%)   Neutro Abs 6.3  1.7 - 7.7 (K/uL)   Lymphocytes Relative 7 (*) 12 - 46 (%)   Lymphs Abs 0.5 (*) 0.7 - 4.0 (K/uL)   Monocytes Relative 3  3 - 12 (%)   Monocytes Absolute 0.2  0.1 - 1.0 (K/uL)   Eosinophils Relative 0  0 - 5 (%)   Eosinophils Absolute 0.0  0.0 - 0.7 (K/uL)   Basophils Relative 1  0 - 1 (%)   Basophils Absolute 0.0  0.0 - 0.1 (K/uL)  PRO B NATRIURETIC PEPTIDE      Component Value Range   Pro B Natriuretic peptide (BNP) 572.4 (*) 0 - 450 (pg/mL)  HEMOGLOBIN A1C      Component Value Range   Hemoglobin A1C 8.1 (*) <5.7 (%)   Mean Plasma Glucose 186 (*) <117 (mg/dL)  GLUCOSE, CAPILLARY      Component Value Range   Glucose-Capillary 249 (*) 70 - 99 (mg/dL)  GLUCOSE, CAPILLARY      Component Value Range   Glucose-Capillary 302 (*) 70 - 99 (mg/dL)  GLUCOSE, CAPILLARY      Component Value Range    Glucose-Capillary 228 (*) 70 - 99 (mg/dL)  GLUCOSE, CAPILLARY      Component Value Range   Glucose-Capillary 229 (*) 70 - 99 (mg/dL)  MRSA PCR SCREENING      Component Value Range   MRSA by PCR NEGATIVE  NEGATIVE   LACTIC ACID, PLASMA      Component Value Range   Lactic Acid, Venous 1.8  0.5 - 2.2 (mmol/L)  MAGNESIUM      Component Value Range   Magnesium 2.2  1.5 - 2.5 (mg/dL)  PROCALCITONIN      Component Value Range   Procalcitonin <0.10    CARDIAC PANEL(CRET KIN+CKTOT+MB+TROPI)      Component Value Range   Total CK 216 (*) 7 - 177 (U/L)   CK, MB 5.0 (*) 0.3 - 4.0 (ng/mL)   Troponin I <0.30  <0.30 (ng/mL)   Relative Index 2.3  0.0 - 2.5   CARDIAC PANEL(CRET KIN+CKTOT+MB+TROPI)      Component Value Range   Total CK 206 (*) 7 - 177 (U/L)   CK, MB 5.3 (*) 0.3 - 4.0 (ng/mL)   Troponin I <0.30  <0.30 (ng/mL)   Relative Index 2.6 (*) 0.0 - 2.5   PRO B NATRIURETIC PEPTIDE      Component Value Range   Pro B Natriuretic peptide (BNP) 770.1 (*) 0 - 450 (pg/mL)  PHOSPHORUS      Component Value Range   Phosphorus 3.6  2.3 - 4.6 (mg/dL)  BLOOD GAS, ARTERIAL      Component Value Range  FIO2 .30     Delivery systems BILEVEL POSITIVE AIRWAY PRESSURE     Mode BILEVEL POSITIVE AIRWAY PRESSURE     Inspiratory PAP 10     Expiratory PAP 5.0     pH, Arterial 7.436 (*) 7.350 - 7.400    pCO2 arterial 49.5 (*) 35.0 - 45.0 (mmHg)   pO2, Arterial 70.6 (*) 80.0 - 100.0 (mmHg)   Bicarbonate 32.7 (*) 20.0 - 24.0 (mEq/L)   TCO2 29.7  0 - 100 (mmol/L)   Acid-Base Excess 7.7 (*) 0.0 - 2.0 (mmol/L)   O2 Saturation 95.5     Patient temperature 98.6     Collection site RIGHT RADIAL     Drawn by COLLECTED BY RT     Sample type ARTERIAL DRAW     Allens test (pass/fail) PASS  PASS   GLUCOSE, CAPILLARY      Component Value Range   Glucose-Capillary 345 (*) 70 - 99 (mg/dL)   Comment 1 Documented in Chart     Comment 2 Notify RN    GLUCOSE, CAPILLARY      Component Value Range    Glucose-Capillary 344 (*) 70 - 99 (mg/dL)   Comment 1 Notify RN     Comment 2 Documented in Chart    GLUCOSE, CAPILLARY      Component Value Range   Glucose-Capillary 297 (*) 70 - 99 (mg/dL)   Comment 1 Notify RN    COMPREHENSIVE METABOLIC PANEL      Component Value Range   Sodium 136  135 - 145 (mEq/L)   Potassium 3.6  3.5 - 5.1 (mEq/L)   Chloride 95 (*) 96 - 112 (mEq/L)   CO2 34 (*) 19 - 32 (mEq/L)   Glucose, Bld 294 (*) 70 - 99 (mg/dL)   BUN 24 (*) 6 - 23 (mg/dL)   Creatinine, Ser 4.09  0.50 - 1.10 (mg/dL)   Calcium 8.9  8.4 - 81.1 (mg/dL)   Total Protein 6.6  6.0 - 8.3 (g/dL)   Albumin 3.0 (*) 3.5 - 5.2 (g/dL)   AST 23  0 - 37 (U/L)   ALT 17  0 - 35 (U/L)   Alkaline Phosphatase 54  39 - 117 (U/L)   Total Bilirubin 0.2 (*) 0.3 - 1.2 (mg/dL)   GFR calc non Af Amer 77 (*) >90 (mL/min)   GFR calc Af Amer 89 (*) >90 (mL/min)  CARDIAC PANEL(CRET KIN+CKTOT+MB+TROPI)      Component Value Range   Total CK 191 (*) 7 - 177 (U/L)   CK, MB 4.6 (*) 0.3 - 4.0 (ng/mL)   Troponin I <0.30  <0.30 (ng/mL)   Relative Index 2.4  0.0 - 2.5   GLUCOSE, CAPILLARY      Component Value Range   Glucose-Capillary 294 (*) 70 - 99 (mg/dL)   Comment 1 Documented in Chart     Comment 2 Notify RN    GLUCOSE, CAPILLARY      Component Value Range   Glucose-Capillary 324 (*) 70 - 99 (mg/dL)   Comment 1 Documented in Chart     Comment 2 Notify RN    GLUCOSE, CAPILLARY      Component Value Range   Glucose-Capillary 259 (*) 70 - 99 (mg/dL)   Comment 1 Documented in Chart     Comment 2 Notify RN    GLUCOSE, CAPILLARY      Component Value Range   Glucose-Capillary 198 (*) 70 - 99 (mg/dL)   Comment 1 Documented in Chart  Comment 2 Notify RN    GLUCOSE, CAPILLARY      Component Value Range   Glucose-Capillary 295 (*) 70 - 99 (mg/dL)   Comment 1 Documented in Chart     Comment 2 Notify RN    GLUCOSE, CAPILLARY      Component Value Range   Glucose-Capillary 386 (*) 70 - 99 (mg/dL)  BASIC METABOLIC  PANEL      Component Value Range   Sodium 135  135 - 145 (mEq/L)   Potassium 3.4 (*) 3.5 - 5.1 (mEq/L)   Chloride 96  96 - 112 (mEq/L)   CO2 32  19 - 32 (mEq/L)   Glucose, Bld 341 (*) 70 - 99 (mg/dL)   BUN 29 (*) 6 - 23 (mg/dL)   Creatinine, Ser 1.61  0.50 - 1.10 (mg/dL)   Calcium 8.5  8.4 - 09.6 (mg/dL)   GFR calc non Af Amer 65 (*) >90 (mL/min)   GFR calc Af Amer 76 (*) >90 (mL/min)  GLUCOSE, CAPILLARY      Component Value Range   Glucose-Capillary 275 (*) 70 - 99 (mg/dL)   Comment 1 Notify RN     Comment 2 Documented in Chart    GLUCOSE, CAPILLARY      Component Value Range   Glucose-Capillary 266 (*) 70 - 99 (mg/dL)   Comment 1 Documented in Chart     Comment 2 Notify RN    GLUCOSE, CAPILLARY      Component Value Range   Glucose-Capillary 363 (*) 70 - 99 (mg/dL)   Comment 1 Documented in Chart     Comment 2 Notify RN    GLUCOSE, CAPILLARY      Component Value Range   Glucose-Capillary 252 (*) 70 - 99 (mg/dL)  GLUCOSE, CAPILLARY      Component Value Range   Glucose-Capillary 318 (*) 70 - 99 (mg/dL)  GLUCOSE, CAPILLARY      Component Value Range   Glucose-Capillary 259 (*) 70 - 99 (mg/dL)  PRO B NATRIURETIC PEPTIDE      Component Value Range   Pro B Natriuretic peptide (BNP) 1101.0 (*) 0 - 450 (pg/mL)  BASIC METABOLIC PANEL      Component Value Range   Sodium 140  135 - 145 (mEq/L)   Potassium 3.0 (*) 3.5 - 5.1 (mEq/L)   Chloride 98  96 - 112 (mEq/L)   CO2 36 (*) 19 - 32 (mEq/L)   Glucose, Bld 168 (*) 70 - 99 (mg/dL)   BUN 32 (*) 6 - 23 (mg/dL)   Creatinine, Ser 0.45  0.50 - 1.10 (mg/dL)   Calcium 8.3 (*) 8.4 - 10.5 (mg/dL)   GFR calc non Af Amer 76 (*) >90 (mL/min)   GFR calc Af Amer 88 (*) >90 (mL/min)  CBC      Component Value Range   WBC 6.9  4.0 - 10.5 (K/uL)   RBC 3.39 (*) 3.87 - 5.11 (MIL/uL)   Hemoglobin 10.4 (*) 12.0 - 15.0 (g/dL)   HCT 40.9 (*) 81.1 - 46.0 (%)   MCV 92.0  78.0 - 100.0 (fL)   MCH 30.7  26.0 - 34.0 (pg)   MCHC 33.3  30.0 - 36.0  (g/dL)   RDW 91.4  78.2 - 95.6 (%)   Platelets 186  150 - 400 (K/uL)  DIFFERENTIAL      Component Value Range   Neutrophils Relative 86 (*) 43 - 77 (%)   Neutro Abs 5.9  1.7 - 7.7 (K/uL)   Lymphocytes  Relative 6 (*) 12 - 46 (%)   Lymphs Abs 0.4 (*) 0.7 - 4.0 (K/uL)   Monocytes Relative 8  3 - 12 (%)   Monocytes Absolute 0.5  0.1 - 1.0 (K/uL)   Eosinophils Relative 0  0 - 5 (%)   Eosinophils Absolute 0.0  0.0 - 0.7 (K/uL)   Basophils Relative 0  0 - 1 (%)   Basophils Absolute 0.0  0.0 - 0.1 (K/uL)  GLUCOSE, CAPILLARY      Component Value Range   Glucose-Capillary 389 (*) 70 - 99 (mg/dL)   Comment 1 Notify RN    GLUCOSE, CAPILLARY      Component Value Range   Glucose-Capillary 202 (*) 70 - 99 (mg/dL)  GLUCOSE, CAPILLARY      Component Value Range   Glucose-Capillary 109 (*) 70 - 99 (mg/dL)   Comment 1 Documented in Chart     Comment 2 Notify RN    GLUCOSE, CAPILLARY      Component Value Range   Glucose-Capillary 226 (*) 70 - 99 (mg/dL)  URINE CULTURE      Component Value Range   Specimen Description URINE, CATHETERIZED     Special Requests NONE     Setup Time [id number]     Colony Count >=100,000 COLONIES/ML     Culture       Value: ESCHERICHIA COLI     KLEBSIELLA PNEUMONIAE   Report Status 05/17/2011 FINAL     Organism ID, Bacteria ESCHERICHIA COLI     Organism ID, Bacteria KLEBSIELLA PNEUMONIAE    GLUCOSE, CAPILLARY      Component Value Range   Glucose-Capillary 460 (*) 70 - 99 (mg/dL)  GLUCOSE, CAPILLARY      Component Value Range   Glucose-Capillary 463 (*) 70 - 99 (mg/dL)   Comment 1 Notify RN    HEMOGLOBIN A1C      Component Value Range   Hemoglobin A1C 8.0 (*) <5.7 (%)   Mean Plasma Glucose 183 (*) <117 (mg/dL)  GLUCOSE, CAPILLARY      Component Value Range   Glucose-Capillary 333 (*) 70 - 99 (mg/dL)  GLUCOSE, CAPILLARY      Component Value Range   Glucose-Capillary 292 (*) 70 - 99 (mg/dL)  BASIC METABOLIC PANEL      Component Value Range   Sodium  136  135 - 145 (mEq/L)   Potassium 4.0  3.5 - 5.1 (mEq/L)   Chloride 97  96 - 112 (mEq/L)   CO2 35 (*) 19 - 32 (mEq/L)   Glucose, Bld 286 (*) 70 - 99 (mg/dL)   BUN 28 (*) 6 - 23 (mg/dL)   Creatinine, Ser 8.11  0.50 - 1.10 (mg/dL)   Calcium 8.3 (*) 8.4 - 10.5 (mg/dL)   GFR calc non Af Amer 78 (*) >90 (mL/min)   GFR calc Af Amer >90  >90 (mL/min)  GLUCOSE, CAPILLARY      Component Value Range   Glucose-Capillary 210 (*) 70 - 99 (mg/dL)  GLUCOSE, CAPILLARY      Component Value Range   Glucose-Capillary 141 (*) 70 - 99 (mg/dL)   Comment 1 Notify RN    GLUCOSE, CAPILLARY      Component Value Range   Glucose-Capillary 296 (*) 70 - 99 (mg/dL)  GLUCOSE, CAPILLARY      Component Value Range   Glucose-Capillary 144 (*) 70 - 99 (mg/dL)  GLUCOSE, CAPILLARY      Component Value Range   Glucose-Capillary 159 (*) 70 - 99 (mg/dL)  PRO B NATRIURETIC PEPTIDE      Component Value Range   Pro B Natriuretic peptide (BNP) 1196.0 (*) 0 - 450 (pg/mL)  BASIC METABOLIC PANEL      Component Value Range   Sodium 138  135 - 145 (mEq/L)   Potassium 3.7  3.5 - 5.1 (mEq/L)   Chloride 96  96 - 112 (mEq/L)   CO2 35 (*) 19 - 32 (mEq/L)   Glucose, Bld 221 (*) 70 - 99 (mg/dL)   BUN 26 (*) 6 - 23 (mg/dL)   Creatinine, Ser 1.61  0.50 - 1.10 (mg/dL)   Calcium 9.1  8.4 - 09.6 (mg/dL)   GFR calc non Af Amer 76 (*) >90 (mL/min)   GFR calc Af Amer 88 (*) >90 (mL/min)  CBC      Component Value Range   WBC 8.5  4.0 - 10.5 (K/uL)   RBC 3.77 (*) 3.87 - 5.11 (MIL/uL)   Hemoglobin 11.5 (*) 12.0 - 15.0 (g/dL)   HCT 04.5 (*) 40.9 - 46.0 (%)   MCV 91.5  78.0 - 100.0 (fL)   MCH 30.5  26.0 - 34.0 (pg)   MCHC 33.3  30.0 - 36.0 (g/dL)   RDW 81.1  91.4 - 78.2 (%)   Platelets 255  150 - 400 (K/uL)  DIFFERENTIAL      Component Value Range   Neutrophils Relative 72  43 - 77 (%)   Lymphocytes Relative 17  12 - 46 (%)   Monocytes Relative 10  3 - 12 (%)   Eosinophils Relative 0  0 - 5 (%)   Basophils Relative 1  0 - 1  (%)   Neutro Abs 6.1  1.7 - 7.7 (K/uL)   Lymphs Abs 1.4  0.7 - 4.0 (K/uL)   Monocytes Absolute 0.9  0.1 - 1.0 (K/uL)   Eosinophils Absolute 0.0  0.0 - 0.7 (K/uL)   Basophils Absolute 0.1  0.0 - 0.1 (K/uL)   WBC Morphology MILD LEFT SHIFT (1-5% METAS, OCC MYELO, OCC BANDS)    GLUCOSE, CAPILLARY      Component Value Range   Glucose-Capillary 368 (*) 70 - 99 (mg/dL)   Comment 1 Notify RN    GLUCOSE, CAPILLARY      Component Value Range   Glucose-Capillary 212 (*) 70 - 99 (mg/dL)  GLUCOSE, CAPILLARY      Component Value Range   Glucose-Capillary 181 (*) 70 - 99 (mg/dL)   Comment 1 Notify RN    GLUCOSE, CAPILLARY      Component Value Range   Glucose-Capillary 411 (*) 70 - 99 (mg/dL)   Comment 1 Notify RN    GLUCOSE, CAPILLARY      Component Value Range   Glucose-Capillary 458 (*) 70 - 99 (mg/dL)   Comment 1 Notify RN    GLUCOSE, CAPILLARY      Component Value Range   Glucose-Capillary 210 (*) 70 - 99 (mg/dL)   Comment 1 Notify RN    GLUCOSE, CAPILLARY      Component Value Range   Glucose-Capillary 176 (*) 70 - 99 (mg/dL)   Comment 1 Notify RN    GLUCOSE, CAPILLARY      Component Value Range   Glucose-Capillary 323 (*) 70 - 99 (mg/dL)   Comment 1 Notify RN       Signed: Hendy Brindle NEVILL 05/18/2011, 5:08 AM

## 2011-05-18 NOTE — Progress Notes (Signed)
Patient's daughter upset, found her mother sitting on the side of bed stating she had called several times for assistance to bathroom, Designer, television/film set paged for nurse and/or tech to go to patient's room, also daughter request that Dr. Kevan Ny be called for orders for home health with Genevieve Norlander to provide PT and NT assistance at home and clarification on the discharge medications regarding the ordered antibiotics.  I phoned the office of Dr. Kevan Ny, spoke with him regarding these requests, orders given.  The daughter verbally complaining about numerous issues regarding the doctor's care, telephone system for nurses being substandard, and wishes to speak with someone high up.  Notified AC, Chasity Hearn RN, AD and Gennaro Africa, RN,Director and left message for [place].

## 2011-05-18 NOTE — Progress Notes (Signed)
05/18/11, Kathi Der RNC-MNN, BSN, [phone].  CM received referral.  CM spoke with pt.  and offered choice for [facility] services.  Pt states that she has used [place] before and would like to use them again.  Debbie at [facility] contacted with order for [facility] PT and NT-confirmation of services received.   Facesheet, H & P, PT note, D/C summary, and order given.  Pt's daughter will be able to help pt at home.

## 2011-05-18 NOTE — Progress Notes (Signed)
Multiple attempts made to aide with home teaching and insulin parameters ordered by MD. Pt daughter adamant about care and what she feels is necessary for her mother, and Capillary blood glucose levels in the 200s are in an acceptable range. Arranged for daughter to speak with management about pts care and further concerns.   Pt discharged home. D/c instructions and prescriptions given to daughter. Pt wheeled to front entrance on 02 which was connected to pts home supply once transferred to the car.  Pt daughter verbalized understanding of home meds and instructions.    [name]  05/18/2011

## 2011-05-21 ENCOUNTER — Telehealth: Payer: Self-pay | Admitting: Internal Medicine

## 2011-05-21 NOTE — Telephone Encounter (Signed)
C/o productive cough with thick yellow mucus starting today, and wheezing. Was d/c'd from hospital with Ceftin 250mg  (2) tabs tid x 2 days ago, scheduled to take last dose tonight. Caller does not feel this abx is strong enough since her provider that prescribed it "is not a lung doctor". Denies fever, chest pain/tightness. Please advise, thank you.   Allergies  Allergen Reactions  . Levaquin     Unable to sleep, pt stated that she felt crazy--only took for 3 days  . Statins   . Sulfonamide Derivatives   . Penicillins Rash   [name]

## 2011-05-21 NOTE — Telephone Encounter (Signed)
Called spoke with Alona Bene, advised of TP's recs.  Alona Bene verbalized her understanding - will call within the next 48 hours if pt's symptoms do not improve or worsen for work-in appt.

## 2011-05-21 NOTE — Telephone Encounter (Signed)
Pt has multiple drug allergies She was placed on this abx due to both bronchitis  And UTI that this was the only abx that would cover both.  Tell her to stay on this and if not improving call back on last day of abx.  Please contact office for sooner follow up if symptoms do not improve or worsen or seek emergency care

## 2011-05-21 NOTE — Telephone Encounter (Signed)
LMTCB

## 2011-05-22 ENCOUNTER — Telehealth: Payer: Self-pay | Admitting: Internal Medicine

## 2011-05-22 MED ORDER — CEPHALEXIN 500 MG PO CAPS: ORAL_CAPSULE | ORAL | Status: DC

## 2011-05-22 NOTE — Telephone Encounter (Signed)
I spoke with [name] and advised her of TP recs. She voiced her understanding and keflex has been sent to the pharmacy

## 2011-05-22 NOTE — Telephone Encounter (Signed)
I spoke with daughter and she states her understanding from yesterday was that a few extra days of the keflex was going ot be called in. I advised her I did not see where this was stated. JJ spoke with TP regarding and was advised to send this to ehr. Please advise Tammy, thanks

## 2011-05-22 NOTE — Telephone Encounter (Signed)
Can extend Keflex 500mg  Three times a day  # 9 for additional 3 days .  mucinex dm Twice daily  As needed   Fluids and rest  Please contact office for sooner follow up if symptoms do not improve or worsen or seek emergency care  Ov if not improving.

## 2011-05-28 ENCOUNTER — Ambulatory Visit (INDEPENDENT_AMBULATORY_CARE_PROVIDER_SITE_OTHER): Payer: Medicare Other | Admitting: Adult Health

## 2011-05-28 ENCOUNTER — Other Ambulatory Visit (INDEPENDENT_AMBULATORY_CARE_PROVIDER_SITE_OTHER): Payer: Medicare Other

## 2011-05-28 ENCOUNTER — Encounter: Payer: Self-pay | Admitting: Adult Health

## 2011-05-28 ENCOUNTER — Ambulatory Visit: Payer: Medicare Other | Admitting: Internal Medicine

## 2011-05-28 ENCOUNTER — Telehealth: Payer: Self-pay | Admitting: Adult Health

## 2011-05-28 ENCOUNTER — Ambulatory Visit (INDEPENDENT_AMBULATORY_CARE_PROVIDER_SITE_OTHER)
Admission: RE | Admit: 2011-05-28 | Discharge: 2011-05-28 | Disposition: A | Discharge status: Home / Self Care | Payer: Medicare Other | Source: Ambulatory Visit | Attending: Adult Health | Admitting: Adult Health

## 2011-05-28 VITALS — BP 122/68 | HR 68 | Temp 96.7°F | Ht 61.0 in | Wt 134.6 lb

## 2011-05-28 DIAGNOSIS — J441 Chronic obstructive pulmonary disease with (acute) exacerbation: Secondary | ICD-10-CM

## 2011-05-28 DIAGNOSIS — R300 Dysuria: Secondary | ICD-10-CM

## 2011-05-28 DIAGNOSIS — J449 Chronic obstructive pulmonary disease, unspecified: Secondary | ICD-10-CM

## 2011-05-28 DIAGNOSIS — N390 Urinary tract infection, site not specified: Secondary | ICD-10-CM

## 2011-05-28 LAB — URINALYSIS
Hgb urine dipstick: NEGATIVE
Nitrite: NEGATIVE
Specific Gravity, Urine: 1.01 (ref 1.000–1.030)
Total Protein, Urine: NEGATIVE

## 2011-05-28 NOTE — Progress Notes (Signed)
Subjective:    Patient ID: [name], female    DOB: 1924/04/03, 78 y.o.   MRN: [id number]  HPI 47 yowf quit smoking 2002 with onset doe and a mild chronic cough. She subsequently gained 15 pounds and beginning about the middle of 2006 began to have exacerbations with coughing wheezing and shortness of breath which required short course of prednisone to get her back to baseline but requiring Prednisone daily since 3/09.   December 22, 2007 ov no decrease in ex tol albeit on oxygen at 24 hours a day and maintaining prednisone at 10 mg daily with gradual improvement in dyspnea.   March 16, 2010  c/o sharp pain substernally radiating back along lower rib cage, c/o dyspnea , no wheeze, no signs of cold - Not taking spiriva  Steroid dependent, takes pulmicort & duonebs  Told at [facility] that she has clogged arteries causing abdominal pain  EKG - nSR with RBBB& left bifascicular block - q waves in V1-2 related to BBB ? vs old MI   April 03, 2010 --Presents for follow up. Had fall 3 weeks ago. She lost her balance in closet 10/29 and fell forward. Had bilateral sharp rib pain w/ radiation into back. CXR w/ no acute changes. Seen at urgent care 2 weeks go, tx w/ advil. She is no better. Has sudden sharp pain in ribs bialterally w/ pain into back-catching pain w/ sudden movement. Denies chest pain, dyspnea, orthopnea, hemoptysis, fever, n/v/d, edema, headache. Her to breathe in at times. dx T 9 fx > Deveshwar eval, no rx.   April 20, 2010 ov cc sob x 3 weeks, sob at rest, no unusual cough/ congestion, does ok sleeping . Increased pred back to 20 mg per day with no benefit. no purulent sputum. back better, no leg c/ow just gen fatigue. Pt denies any significant sore throat, nasal congestion or excess secretions, fever, chills, sweats, unintended wt loss, pleuritic or exertional cp, orthopnea pnd or leg swelling.   Jan 2012 Hosp much better and 10 days of avelox >  Then cipro and able prednisone     08/22/10 Mid march prednisone up to 40 and another round of cipro >  Looser less yellow and pred down to 10 mg per day.  Confused with meds/ instructions and maint vs prn.   >>rx perforomoist and spiriva   09/19/10 Acute OV Complains of prod cough with yellow mucus (clearing), wheezing - states feels "about the same" even with doxy rx given 5.4.12.  daughter reports increased pred to 20mg  on 5.4.12. She is some better and mucus is mostly clear.Marland Kitchen She was at beach last week when cough got worse. Cant stop coughing at times. Worse at night. >>rec to finish doxyc, and steroid bump to 40mg  w/ taper to hold at 20mg    10/04/10 Follow up  2 week follow up. Last ov w/ AECOPD. She was tx with doxyc and steroid bump at 40mg  . She states wheezing and cough have improved, still having clear mucus production and weakness. Feels a lot better. Continues to have no energy.  No discolored mucus or fever. No increased swelling.  She is currently at 20mg  of prednsione daily   11/21/2010 Acute OV  Pt presents for an acute office visit. Complains of increased SOB, wheezing, prod cough with yellow/brown mucus x 4 days.  Pt is here with her daughter today. 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 She was having significant abdominal pain w/  n/v and weight loss over last several weeks -she is feeling so much better w/ improved appetite and resolution of abdominal pain. She started with cough and congestion after discharge. No fever or chest pain. No increased edema .  SHe is currently on prednisone 20mg  daily . She is now on plavix .Denies hemoptysis. >>Levaquin /steroid taper.   8/16//2012 Follow up  Returns for 1 month follow up . She has AECOPD last ov , tx w/ Levaquin and steroid taper . Says she is feeling better back to her baseline. Currently on pred 10mg  daily . No flare in cough or dyspnea. No leg swelling . No fever.  Says she is off Spiriva- messed with her eyes in past.   >>no changes   02/21/2011 Acute OV  Complains of cough , congestion , wheezing for 2 weeks. Took Cipro 7 days and steroid burst (  left over from daughter rx ). She got better but then cough returned 3 days ago, restarted Cipro 500mg  . Still has some cough , but much better. Currently on prednisone 10mg  daily . Better but not totally over symptoms. Daughter Alona Bene) is leaving to go out of town and would like her to be checked. No hemoptysis or edema  >>  05/28/2011 [facility]  Admitted December 27 2 05/18/2011 for acute COPD exacerbation, urinary tract infection, influenza, acute on chronic respiratory failure. The patient presented initially with acute confusion, hypoxia, and weakness, with fever of 103.5. She was started on Tamiflu. IV antibiotics and IV steroids. She was treated with nebulizer therapy. She was discharged on Keflex, which she has now completed. Urine culture showed Escherichia coli and Klebsiella UTI Since discharge. Patient continues to feel somewhat weak. However, her breathing has improved with decreased cough and congestion. She is currently on a prednisone taper at 30 mg and will be changing over to 20 mg tomorrow. Her daughter is with her today and is concerned that she continues to have a urinary tract infection. Because of complaints of intermittent dysuria and urgency. She denies any fever, chest pain, hemoptysis, or leg swelling.    Review of Systems  Constitutional:   No  weight loss, night sweats,  Fevers, chills, + fatigue, or  lassitude.  HEENT:   No headaches,  Difficulty swallowing,  Tooth/dental problems, or  Sore throat,                No sneezing, itching, ear ache, nasal congestion, post nasal drip,   CV:  No chest pain,  Orthopnea, PND, swelling in lower extremities, anasarca, dizziness, palpitations, syncope.   GI  No heartburn, indigestion, abdominal pain, nausea, vomiting, diarrhea, change in bowel habits, loss of appetite, bloody stools.    Resp:   No coughing up of blood. Marland Kitchen  No chest wall deformity  Skin: no rash or lesions.  GU: + dysuria,  NO change in color of urine,  + urgency or frequency.   No flank pain, no hematuria   MS:  No joint pain or swelling.  No decreased range of motion.  No back pain.  Psych:  No change in mood or affect. No depression or anxiety.  No memory loss.         Objective:   Physical Exam  GEN: A/Ox3; pleasant , NAD, frail and elderly   HEENT:  [place]/AT,  EACs-clear, TMs-wnl, NOSE-clear, THROAT-clear, no lesions, no postnasal drip or exudate noted.   NECK:  Supple w/ fair ROM; no JVD; normal carotid impulses w/o bruits; no  thyromegaly or nodules palpated; no lymphadenopathy.  RESP  Coarse BS w/ no wheezing.  no accessory muscle use, no dullness to percussion  CARD:  RRR, no m/r/g  , no peripheral edema, pulses intact, no cyanosis or clubbing.  GI:   Soft & nt; nml bowel sounds; no organomegaly or masses detected. Neg CVA tenderness   Musco: Warm bil, no deformities or joint swelling noted.   Neuro: alert, no focal deficits noted.    Skin: Warm, no lesions or rashes         Assessment & Plan:

## 2011-05-28 NOTE — Patient Instructions (Addendum)
I will call with xray results. And urine results.  Continue on current regimen.  follow up Dr. Sherene Sires  In 4 weeks and As needed   Taper prednisone 20mg  daily for 1 week then hold at 10mg  daily

## 2011-05-28 NOTE — Assessment & Plan Note (Signed)
Recent UTI -e coli and klebsiella tx w/ abxs Will repeat cx to verify this has resolved as she still has intermittent symtpoms.

## 2011-05-28 NOTE — Assessment & Plan Note (Signed)
COPD exacerbation complicated by Influenza and suspected RLL PNA and a severe UTI  Check xray today  Appears to be improving slowly Will taper prednisone slowly and hold at 10mg  over next 2 weeks.

## 2011-05-29 ENCOUNTER — Telehealth: Payer: Self-pay | Admitting: Adult Health

## 2011-05-29 MED ORDER — BUDESONIDE 0.25 MG/2ML IN SUSP: 0.2500 mg | Freq: Two times a day (BID) | RESPIRATORY_TRACT | Status: DC

## 2011-05-29 NOTE — Telephone Encounter (Signed)
Alona Bene returned call.  She stated that pt is not taking duoneb and requested this be taken off pt's med list (I remember discussing this with her yesterday but forgot to fix) and she noticed that pt's budesonide is documented for once daily where pt takes this BID scheduled.  This has been fixed as well.  Updated copy of med list printed off and mailed to [place] per her request at her verified address of [address] [place] [address].  Nothing further needed.

## 2011-05-29 NOTE — Telephone Encounter (Signed)
Pt daughter advised of cxr results per result note. [name], CMA

## 2011-05-29 NOTE — Telephone Encounter (Signed)
Thought everything was corrected yesterday ov - apologies.  LMOM TCB x1 for Alona Bene.

## 2011-05-30 LAB — URINE CULTURE

## 2011-05-31 ENCOUNTER — Telehealth: Payer: Self-pay | Admitting: Adult Health

## 2011-05-31 NOTE — Telephone Encounter (Signed)
Contacted daughter. Patient is aware of result and recs. Daughter also states that patient's mucus went from white to yellow again, wants to know if she needs an antibiotic. Please advise. Thanks

## 2011-05-31 NOTE — Telephone Encounter (Signed)
LMTCB for [name]

## 2011-05-31 NOTE — Telephone Encounter (Signed)
Urine shows a very small infection  Xray without much change  Can begin Omnicef 300mg  Twice daily  #20 for 10 days  Take with food Eat yogurt daily  follow up in 2 weeks and As needed   Please contact office for sooner follow up if symptoms do not improve or worsen or seek emergency care

## 2011-06-01 MED ORDER — CEFDINIR 300 MG PO CAPS: 300.0000 mg | ORAL_CAPSULE | Freq: Two times a day (BID) | ORAL | Status: DC

## 2011-06-01 NOTE — Telephone Encounter (Signed)
She has a low rx to pcn will allow omnicef as she has many drug intolerances. Advised if develop rxn to stop and call back immediately  Please send .

## 2011-06-01 NOTE — Telephone Encounter (Signed)
Called, spoke with pt's daughter, Alona Bene.  I informed her of results and recs as stated below per TP:  Urine shows a very small infection  Xray without much change  Can begin Omnicef 300mg  Twice daily #20 for 10 days  Take with food  Eat yogurt daily  follow up in 2 weeks and As needed  Please contact office for sooner follow up if symptoms do not improve or worsen or seek emergency care   Alona Bene verbalized understanding of these results and instructions.  She is requesting to call back to schedule 2 wk follow up and would like abx to be sent to [facility].    Tammy, when trying to send omnicef, there is an allergy/contraindication message stating pt is allergic to PCN.  Per allergy list, PCN causes a rash.  Are you still ok with the omnicef?

## 2011-06-01 NOTE — Telephone Encounter (Signed)
I have sent rx in. ATC to [facility] back but line is busy. Wcb

## 2011-06-01 NOTE — Telephone Encounter (Signed)
[phone] joyce pts daughter is returning call

## 2011-06-04 MED ORDER — CEFDINIR 300 MG PO CAPS: 300.0000 mg | ORAL_CAPSULE | Freq: Two times a day (BID) | ORAL | Status: AC

## 2011-06-04 NOTE — Telephone Encounter (Signed)
I will sign for the nebulizer meds when I see it  Ok to extend the omnicef x 5 extra days.

## 2011-06-04 NOTE — Telephone Encounter (Signed)
Pt has already been on Omnicef for 3 days with no problems per daughter Alona Bene. Alona Bene says the [facility] was going to fax a request for records regarding the nebulizer medications due to Medicare. Dr. Sherene Sires, have you seen this form from the pharmacy? Also, Alona Bene wants to know if the patient should have prescription for [facility] extended because she is so resistant to so many abx. Pls advise. Allergies  Allergen Reactions  . Levaquin     Unable to sleep, pt stated that she felt crazy--only took for 3 days  . Statins   . Sulfonamide Derivatives   . Penicillins Rash

## 2011-06-04 NOTE — Telephone Encounter (Signed)
[id number].Jennifer Castillo, CMA  

## 2011-06-04 NOTE — Telephone Encounter (Signed)
Spoke with pt's daughter and notified of recs per MW She verbalized understanding.  Rx for omnicef was sent to pharm.

## 2011-06-07 ENCOUNTER — Telehealth: Payer: Self-pay | Admitting: Adult Health

## 2011-06-07 MED ORDER — CLOTRIMAZOLE 10 MG MT TROC: OROMUCOSAL | Status: DC

## 2011-06-07 NOTE — Telephone Encounter (Signed)
Daughter states the pharmacy will not send out pt's Albuterol or Pulmicort for her nebulizer due to [facility] audit. I called [facility] [phone] and spoke with Vernona Rieger. They have received the office notes that were requested and they will send out pt's medications. They are aware pt is completely out of her Pulmicort. I gave the daughter samples of Pulmicort 0.5mg  and told her that the pt can use 1/2 vial twice daily until she receives her order from the pharmacy.  Also, pt's daughter states that the pt c/o having a film in her mouth due to the abx and wants to know if we can prescribe something for thrush or change the abx. She also requested that a copy of her labs and cxr report be sent to Dr. Pete Glatter. Pls advise. Allergies  Allergen Reactions  . Levaquin     Unable to sleep, pt stated that she felt crazy--only took for 3 days  . Statins   . Sulfonamide Derivatives   . Penicillins Rash

## 2011-06-07 NOTE — Telephone Encounter (Signed)
I spoke with joyce and is aware of rx directions. Rx was sent to the pharmacy and nothing further was needed

## 2011-06-07 NOTE — Telephone Encounter (Signed)
Ok to use clotrimazone troch 10 mg qid prn #16

## 2011-08-21 ENCOUNTER — Encounter: Payer: Self-pay | Admitting: Adult Health

## 2011-08-21 ENCOUNTER — Telehealth: Payer: Self-pay | Admitting: Critical Care Medicine

## 2011-08-21 ENCOUNTER — Ambulatory Visit (INDEPENDENT_AMBULATORY_CARE_PROVIDER_SITE_OTHER): Payer: Medicare Other | Admitting: Adult Health

## 2011-08-21 ENCOUNTER — Ambulatory Visit (HOSPITAL_BASED_OUTPATIENT_CLINIC_OR_DEPARTMENT_OTHER)
Admission: RE | Admit: 2011-08-21 | Discharge: 2011-08-21 | Disposition: A | Discharge status: Home / Self Care | Payer: Medicare Other | Source: Ambulatory Visit | Attending: Adult Health | Admitting: Adult Health

## 2011-08-21 VITALS — BP 124/50 | HR 71 | Temp 98.2°F | Ht 61.0 in | Wt 141.0 lb

## 2011-08-21 DIAGNOSIS — J449 Chronic obstructive pulmonary disease, unspecified: Secondary | ICD-10-CM

## 2011-08-21 DIAGNOSIS — R61 Generalized hyperhidrosis: Secondary | ICD-10-CM

## 2011-08-21 DIAGNOSIS — J4489 Other specified chronic obstructive pulmonary disease: Secondary | ICD-10-CM | POA: Insufficient documentation

## 2011-08-21 DIAGNOSIS — J441 Chronic obstructive pulmonary disease with (acute) exacerbation: Secondary | ICD-10-CM

## 2011-08-21 DIAGNOSIS — R059 Cough, unspecified: Secondary | ICD-10-CM | POA: Insufficient documentation

## 2011-08-21 DIAGNOSIS — J189 Pneumonia, unspecified organism: Secondary | ICD-10-CM

## 2011-08-21 DIAGNOSIS — I517 Cardiomegaly: Secondary | ICD-10-CM

## 2011-08-21 DIAGNOSIS — R05 Cough: Secondary | ICD-10-CM | POA: Insufficient documentation

## 2011-08-21 NOTE — Progress Notes (Signed)
Subjective:    Patient ID: [name], female    DOB: 1924/04/03, 78 y.o.   MRN: [id number]  HPI 47 yowf quit smoking 2002 with onset doe and a mild chronic cough. She subsequently gained 15 pounds and beginning about the middle of 2006 began to have exacerbations with coughing wheezing and shortness of breath which required short course of prednisone to get her back to baseline but requiring Prednisone daily since 3/09.   December 22, 2007 ov no decrease in ex tol albeit on oxygen at 24 hours a day and maintaining prednisone at 10 mg daily with gradual improvement in dyspnea.   March 16, 2010  c/o sharp pain substernally radiating back along lower rib cage, c/o dyspnea , no wheeze, no signs of cold - Not taking spiriva  Steroid dependent, takes pulmicort & duonebs  Told at [facility] that she has clogged arteries causing abdominal pain  EKG - nSR with RBBB& left bifascicular block - q waves in V1-2 related to BBB ? vs old MI   April 03, 2010 --Presents for follow up. Had fall 3 weeks ago. She lost her balance in closet 10/29 and fell forward. Had bilateral sharp rib pain w/ radiation into back. CXR w/ no acute changes. Seen at urgent care 2 weeks go, tx w/ advil. She is no better. Has sudden sharp pain in ribs bialterally w/ pain into back-catching pain w/ sudden movement. Denies chest pain, dyspnea, orthopnea, hemoptysis, fever, n/v/d, edema, headache. Her to breathe in at times. dx T 9 fx > Deveshwar eval, no rx.   April 20, 2010 ov cc sob x 3 weeks, sob at rest, no unusual cough/ congestion, does ok sleeping . Increased pred back to 20 mg per day with no benefit. no purulent sputum. back better, no leg c/ow just gen fatigue. Pt denies any significant sore throat, nasal congestion or excess secretions, fever, chills, sweats, unintended wt loss, pleuritic or exertional cp, orthopnea pnd or leg swelling.   Jan 2012 Hosp much better and 10 days of avelox >  Then cipro and able prednisone     08/22/10 Mid march prednisone up to 40 and another round of cipro >  Looser less yellow and pred down to 10 mg per day.  Confused with meds/ instructions and maint vs prn.   >>rx perforomoist and spiriva   09/19/10 Acute OV Complains of prod cough with yellow mucus (clearing), wheezing - states feels "about the same" even with doxy rx given 5.4.12.  daughter reports increased pred to 20mg  on 5.4.12. She is some better and mucus is mostly clear.Marland Kitchen She was at beach last week when cough got worse. Cant stop coughing at times. Worse at night. >>rec to finish doxyc, and steroid bump to 40mg  w/ taper to hold at 20mg    10/04/10 Follow up  2 week follow up. Last ov w/ AECOPD. She was tx with doxyc and steroid bump at 40mg  . She states wheezing and cough have improved, still having clear mucus production and weakness. Feels a lot better. Continues to have no energy.  No discolored mucus or fever. No increased swelling.  She is currently at 20mg  of prednsione daily   11/21/2010 Acute OV  Pt presents for an acute office visit. Complains of increased SOB, wheezing, prod cough with yellow/brown mucus x 4 days.  Pt is here with her daughter today. 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 She was having significant abdominal pain w/  n/v and weight loss over last several weeks -she is feeling so much better w/ improved appetite and resolution of abdominal pain. She started with cough and congestion after discharge. No fever or chest pain. No increased edema .  SHe is currently on prednisone 20mg  daily . She is now on plavix .Denies hemoptysis. >>Levaquin /steroid taper.   8/16//2012 Follow up  Returns for 1 month follow up . She has AECOPD last ov , tx w/ Levaquin and steroid taper . Says she is feeling better back to her baseline. Currently on pred 10mg  daily . No flare in cough or dyspnea. No leg swelling . No fever.  Says she is off Spiriva- messed with her eyes in past.   >>no changes   02/21/2011 Acute OV  Complains of cough , congestion , wheezing for 2 weeks. Took Cipro 7 days and steroid burst (  left over from daughter rx ). She got better but then cough returned 3 days ago, restarted Cipro 500mg  . Still has some cough , but much better. Currently on prednisone 10mg  daily . Better but not totally over symptoms. Daughter Alona Bene) is leaving to go out of town and would like her to be checked. No hemoptysis or edema  >>  05/28/2011 [facility]  Admitted December 27 2 05/18/2011 for acute COPD exacerbation, urinary tract infection, influenza, acute on chronic respiratory failure. The patient presented initially with acute confusion, hypoxia, and weakness, with fever of 103.5. She was started on Tamiflu. IV antibiotics and IV steroids. She was treated with nebulizer therapy. She was discharged on Keflex, which she has now completed. Urine culture showed Escherichia coli and Klebsiella UTI Since discharge. Patient continues to feel somewhat weak. However, her breathing has improved with decreased cough and congestion. She is currently on a prednisone taper at 30 mg and will be changing over to 20 mg tomorrow. Her daughter is with her today and is concerned that she continues to have a urinary tract infection. Because of complaints of intermittent dysuria and urgency. She denies any fever, chest pain, hemoptysis, or leg swelling.   08/21/2011 Acute OV  Patient presents for possible pneumonia. Pt c/o cough with thick green/yellow mucus, sob, wheezing, and weakness x 10 days Also night sweats.  Denies fever, chest pain and chest tightness. Took Doxycycline x  7 days by PCP , finished last dose yesterday  Not helping. No fever or leg swelling Good appetite. No urinary symptoms. Currently on prednisone 20mg  daily     Review of Systems  Constitutional:   No  weight loss, night sweats,  Fevers, chills, + fatigue, or  lassitude.  HEENT:   No headaches,  Difficulty  swallowing,  Tooth/dental problems, or  Sore throat,                No sneezing, itching, ear ache, nasal congestion, post nasal drip,   CV:  No chest pain,  Orthopnea, PND, swelling in lower extremities, anasarca, dizziness, palpitations, syncope.   GI  No heartburn, indigestion, abdominal pain, nausea, vomiting, diarrhea, change in bowel habits, loss of appetite, bloody stools.   Resp:   No coughing up of blood. Marland Kitchen  No chest wall deformity  Skin: no rash or lesions.  GU: neg   MS:  No joint pain or swelling.  No decreased range of motion.  No back pain.  Psych:  No change in mood or affect. No depression or anxiety.  No memory loss.  Objective:   Physical Exam  GEN: A/Ox3; pleasant , NAD, frail and elderly   HEENT:  [place]/AT,  EACs-clear, TMs-wnl, NOSE-clear, THROAT-clear, no lesions, no postnasal drip or exudate noted.   NECK:  Supple w/ fair ROM; no JVD; normal carotid impulses w/o bruits; no thyromegaly or nodules palpated; no lymphadenopathy.  RESP  Coarse BS w/ rhonchi increased on right,  no accessory muscle use, no dullness to percussion  CARD:  RRR, no m/r/g  , no peripheral edema, pulses intact, no cyanosis or clubbing.  GI:   Soft & nt; nml bowel sounds; no organomegaly or masses detected. Neg CVA tenderness   Musco: Warm bil, no deformities or joint swelling noted.   Neuro: alert, no focal deficits noted.    Skin: Warm, no lesions or rashes         Assessment & Plan:

## 2011-08-21 NOTE — Patient Instructions (Signed)
Avelox 400mg  daily for 7 days -take with food-sample given  Mucinex DM Twice daily  As needed  Cough/congesiton  Continue on Prednisone 20mg  daily for now .  Follow up Dr. Sherene Sires  In 2-3 weeks and As needed   Please contact office for sooner follow up if symptoms do not improve or worsen or seek emergency care

## 2011-08-21 NOTE — Assessment & Plan Note (Signed)
Flare  Check xray   Plan:  Avelox 400mg  daily for 7 days -take with food-sample given  Mucinex DM Twice daily  As needed  Cough/congesiton  Continue on Prednisone 20mg  daily for now .  Follow up Dr. Sherene Sires  In 2-3 weeks and As needed   Please contact office for sooner follow up if symptoms do not improve or worsen or seek emergency care

## 2011-08-21 NOTE — Telephone Encounter (Signed)
Called spoke with patient's daughter who reported that patient was given doxycycline x7 days by per PCP for head and upper respiratory symptoms with prod cough.  Per Alona Bene, pt's mucus is clearing but she woke up last night "drenched in sweat" and is weak.  Alona Bene concerned pt may have PNA again.  appt scheduled with TP this moring at 11:45am.  Alona Bene okay with this time and location.

## 2011-08-23 ENCOUNTER — Telehealth: Payer: Self-pay | Admitting: Internal Medicine

## 2011-08-23 MED ORDER — HYDROCODONE-HOMATROPINE 5-1.5 MG/5ML PO SYRP: ORAL_SOLUTION | ORAL | Status: DC

## 2011-08-23 NOTE — Telephone Encounter (Signed)
Hydromet #8 oz 1-2 tsp every 6 hr As needed  Cough-may cause sedation   No refills  Please contact office for sooner follow up if symptoms do not improve or worsen or seek emergency care

## 2011-08-23 NOTE — Telephone Encounter (Signed)
Called and spoke with pts daughter.  She stated that the pt saw TP on 4-9 and pt has been up all night coughing and not able to rest.  Daughter is requesting that something be called in for her cough.  TP please advise if ok to do this.  Thanks  Allergies  Allergen Reactions  . Levaquin     Unable to sleep, pt stated that she felt crazy--only took for 3 days  . Statins   . Sulfonamide Derivatives   . Penicillins Rash

## 2011-08-23 NOTE — Telephone Encounter (Signed)
Rx was called to [facility] for Alona Bene to be made aware

## 2011-08-25 ENCOUNTER — Telehealth: Payer: Self-pay | Admitting: Internal Medicine

## 2011-08-25 MED ORDER — CIPROFLOXACIN HCL 500 MG PO TABS: 500.0000 mg | ORAL_TABLET | Freq: Two times a day (BID) | ORAL | Status: AC

## 2011-08-25 NOTE — Telephone Encounter (Signed)
Hand swollen day #3 avelox, has taken cipro in past ok  Try cirpo 500 mg bid x 7 days then f/u ov with me if not better

## 2011-09-04 ENCOUNTER — Ambulatory Visit (INDEPENDENT_AMBULATORY_CARE_PROVIDER_SITE_OTHER): Payer: Medicare Other | Admitting: Internal Medicine

## 2011-09-04 ENCOUNTER — Encounter: Payer: Self-pay | Admitting: Internal Medicine

## 2011-09-04 VITALS — BP 120/80 | HR 73 | Temp 98.3°F | Ht 62.0 in | Wt 140.8 lb

## 2011-09-04 DIAGNOSIS — J449 Chronic obstructive pulmonary disease, unspecified: Secondary | ICD-10-CM

## 2011-09-04 DIAGNOSIS — J9610 Chronic respiratory failure, unspecified whether with hypoxia or hypercapnia: Secondary | ICD-10-CM

## 2011-09-04 MED ORDER — GUAIFENESIN ER 600 MG PO TB12: ORAL_TABLET | ORAL | Status: AC

## 2011-09-04 MED ORDER — FORMOTEROL FUMARATE 20 MCG/2ML IN NEBU: 20.0000 ug | INHALATION_SOLUTION | Freq: Two times a day (BID) | RESPIRATORY_TRACT | Status: DC

## 2011-09-04 NOTE — Patient Instructions (Signed)
Prednisone should be 2 if doing poorly, one if doing well  Add performist twice daily with budesonide twice daily  Only use your albuterol(plan b is the hfa/puffer and plan c the neb)  as a rescue medication to be used if you can't catch your breath by resting or doing a relaxed purse lip breathing pattern. The less you use it, the better it will work when you need it.   Use mucinex max doses is 1200 mg every 12 hours plus flutter valve as much as you can  Please schedule a follow up office visit in 4 weeks, sooner if needed

## 2011-09-04 NOTE — Progress Notes (Signed)
Subjective:    Patient ID: [name], female    DOB: 1924/04/03, 78 y.o.   MRN: [id number]  HPI 47 yowf quit smoking 2002 with onset doe and a mild chronic cough. She subsequently gained 15 pounds and beginning about the middle of 2006 began to have exacerbations with coughing wheezing and shortness of breath which required short course of prednisone to get her back to baseline but requiring Prednisone daily since 3/09.   December 22, 2007 ov no decrease in ex tol albeit on oxygen at 24 hours a day and maintaining prednisone at 10 mg daily with gradual improvement in dyspnea.   March 16, 2010  c/o sharp pain substernally radiating back along lower rib cage, c/o dyspnea , no wheeze, no signs of cold - Not taking spiriva  Steroid dependent, takes pulmicort & duonebs  Told at [facility] that she has clogged arteries causing abdominal pain  EKG - nSR with RBBB& left bifascicular block - q waves in V1-2 related to BBB ? vs old MI   April 03, 2010 --Presents for follow up. Had fall 3 weeks ago. She lost her balance in closet 10/29 and fell forward. Had bilateral sharp rib pain w/ radiation into back. CXR w/ no acute changes. Seen at urgent care 2 weeks go, tx w/ advil. She is no better. Has sudden sharp pain in ribs bialterally w/ pain into back-catching pain w/ sudden movement. Denies chest pain, dyspnea, orthopnea, hemoptysis, fever, n/v/d, edema, headache. Her to breathe in at times. dx T 9 fx > Deveshwar eval, no rx.   April 20, 2010 ov cc sob x 3 weeks, sob at rest, no unusual cough/ congestion, does ok sleeping . Increased pred back to 20 mg per day with no benefit. no purulent sputum. back better, no leg c/ow just gen fatigue. Pt denies any significant sore throat, nasal congestion or excess secretions, fever, chills, sweats, unintended wt loss, pleuritic or exertional cp, orthopnea pnd or leg swelling.   Jan 2012 Hosp much better and 10 days of avelox >  Then cipro and able prednisone     08/22/10 Mid march prednisone up to 40 and another round of cipro >  Looser less yellow and pred down to 10 mg per day.  Confused with meds/ instructions and maint vs prn.   >>rx perforomoist and spiriva   09/19/10 Acute OV Complains of prod cough with yellow mucus (clearing), wheezing - states feels "about the same" even with doxy rx given 5.4.12.  daughter reports increased pred to 20mg  on 5.4.12. She is some better and mucus is mostly clear.Marland Kitchen She was at beach last week when cough got worse. Cant stop coughing at times. Worse at night. >>rec to finish doxyc, and steroid bump to 40mg  w/ taper to hold at 20mg    10/04/10 Follow up  2 week follow up. Last ov w/ AECOPD. She was tx with doxyc and steroid bump at 40mg  . She states wheezing and cough have improved, still having clear mucus production and weakness. Feels a lot better. Continues to have no energy.  No discolored mucus or fever. No increased swelling.  She is currently at 20mg  of prednsione daily   11/21/2010 Acute OV  Pt presents for an acute office visit. Complains of increased SOB, wheezing, prod cough with yellow/brown mucus x 4 days.  Pt is here with her daughter today. 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 She was having significant abdominal pain w/  n/v and weight loss over last several weeks -she is feeling so much better w/ improved appetite and resolution of abdominal pain. She started with cough and congestion after discharge. No fever or chest pain. No increased edema .  SHe is currently on prednisone 20mg  daily . She is now on plavix .Denies hemoptysis. >>Levaquin /steroid taper.   8/16//2012 Follow up  Returns for 1 month follow up . She has AECOPD last ov , tx w/ Levaquin and steroid taper . Says she is feeling better back to her baseline. Currently on pred 10mg  daily . No flare in cough or dyspnea. No leg swelling . No fever.  Says she is off Spiriva- messed with her eyes in past.   >>no changes   02/21/2011 Acute OV  Complains of cough , congestion , wheezing for 2 weeks. Took Cipro 7 days and steroid burst (  left over from daughter rx ). She got better but then cough returned 3 days ago, restarted Cipro 500mg  . Still has some cough , but much better. Currently on prednisone 10mg  daily . Better but not totally over symptoms. Daughter Alona Bene) is leaving to go out of town and would like her to be checked. No hemoptysis or edema  >>  05/28/2011 [facility]  Admitted December 27 2 05/18/2011 for acute COPD exacerbation, urinary tract infection, influenza, acute on chronic respiratory failure. The patient presented initially with acute confusion, hypoxia, and weakness, with fever of 103.5. She was started on Tamiflu. IV antibiotics and IV steroids. She was treated with nebulizer therapy. She was discharged on Keflex, which she has now completed. Urine culture showed Escherichia coli and Klebsiella UTI Since discharge. Patient continues to feel somewhat weak. However, her breathing has improved with decreased cough and congestion. She is currently on a prednisone taper at 30 mg and will be changing over to 20 mg tomorrow. Her daughter is with her today and is concerned that she continues to have a urinary tract infection. Because of complaints of intermittent dysuria and urgency. She denies any fever, chest pain, hemoptysis, or leg swelling.   08/21/2011 Acute OV / NP Patient presents for possible pneumonia. Pt c/o cough with thick green/yellow mucus, sob, wheezing, and weakness x 10 days Also night sweats.  Denies fever, chest pain and chest tightness. Took Doxycycline x  7 days by PCP , finished last dose yesterday  Not helping. No fever or leg swelling Good appetite. No urinary symptoms. Currently on prednisone 20mg  daily  rec Avelox 400mg  daily for 7 days -take with food-sample given  Mucinex DM Twice daily  As needed  Cough/congesiton  Continue on Prednisone 20mg  daily for  now   09/04/2011 f/u ov/Negin Hegg cc mucus still a bit yellow p 8/10 cipro with still thick mucus but breathing better and less need for daytime saba rescue.  N  Sleeping ok without nocturnal  or early am exacerbation  of respiratory  c/o's or need for noct saba. Also denies any obvious fluctuation of symptoms with weather or environmental changes or other aggravating or alleviating factors except as outlined above   ROS  At present neg for  any significant sore throat, dysphagia, dental problems, itching, sneezing,  nasal congestion or excess/ purulent secretions, ear ache,   fever, chills, sweats, unintended wt loss, pleuritic or exertional cp, hemoptysis, palpitations, orthopnea pnd or leg swelling.  Also denies presyncope, palpitations, heartburn, abdominal pain, anorexia, nausea, vomiting, diarrhea  or change in bowel or urinary habits, change in stools or urine,  dysuria,hematuria,  rash, arthralgias, visual complaints, headache, numbness weakness or ataxia or problems with walking or coordination. No noted change in mood/affect or memory.                            Objective:   Physical Exam  GEN: A/Ox3; pleasant , NAD, frail and elderly   HEENT:  [place]/AT,  EACs-clear, TMs-wnl, NOSE-clear, THROAT-clear, no lesions, no postnasal drip or exudate noted.   NECK:  Supple w/ fair ROM; no JVD; normal carotid impulses w/o bruits; no thyromegaly or nodules palpated; no lymphadenopathy.  RESP  Coarse BS w/ rhonchi increased on right,  no accessory muscle use, no dullness to percussion  CARD:  RRR, no m/r/g  , no peripheral edema, pulses intact, no cyanosis or clubbing.  GI:   Soft & nt; nml bowel sounds; no organomegaly or masses detected. Neg CVA tenderness   Musco: Warm bil, no deformities or joint swelling noted.   Neuro: alert, no focal deficits noted.    Skin: Warm, no lesions or rashes      08/21/11  COPD/chronic changes. Cardiomegaly.  No acute findings or  change.    Assessment & Plan:

## 2011-09-07 DIAGNOSIS — J9610 Chronic respiratory failure, unspecified whether with hypoxia or hypercapnia: Secondary | ICD-10-CM | POA: Insufficient documentation

## 2011-09-07 NOTE — Assessment & Plan Note (Signed)
Adequate control on present rx, reviewed  

## 2011-09-07 NOTE — Assessment & Plan Note (Addendum)
-   PFTs 09/05/07 FEV1 49% with improvement by 8% ratio 39% and diffusing capacity 58%  - prednisone -dependent since 07/2007 - HFA poor February 04, 2009  - Sinus CT ordered 08/22/2010 >>neg  - Change to Performist/Budesonide 08/22/2010   GOLD III/ complicated by 02 dep resp failure/ mild hypercarbia and with  flares with purulent tracheobronchitis and apparently steroid dep at this point  The goal with a chronic steroid dependent illness is always arriving at the lowest effective dose that controls the disease/symptoms and not accepting a set "formula" which is based on statistics or guidelines that don't always take into account patient  variability or the natural hx of the dz in every individual patient, which may well vary over time.  For now therefore I recommend the patient maintain  A ceiling of 20 mg and a floor of 10 mg per day for now  Needs to add back performist bid with budesonide and re-figure her usee of albuterol just as a rescue drug    Each maintenance medication was reviewed in detail including most importantly the difference between maintenance and as needed and under what circumstances the prns are to be used.  Please see instructions for details which were reviewed in writing and the patient given a copy.

## 2011-10-02 ENCOUNTER — Ambulatory Visit: Payer: Medicare Other | Admitting: Adult Health

## 2011-10-03 ENCOUNTER — Encounter: Payer: Self-pay | Admitting: Adult Health

## 2011-10-03 ENCOUNTER — Other Ambulatory Visit (INDEPENDENT_AMBULATORY_CARE_PROVIDER_SITE_OTHER): Payer: Medicare Other

## 2011-10-03 ENCOUNTER — Ambulatory Visit (INDEPENDENT_AMBULATORY_CARE_PROVIDER_SITE_OTHER): Payer: Medicare Other | Admitting: Adult Health

## 2011-10-03 VITALS — BP 116/68 | HR 77 | Temp 96.9°F | Ht 61.0 in | Wt 145.2 lb

## 2011-10-03 DIAGNOSIS — R0609 Other forms of dyspnea: Secondary | ICD-10-CM

## 2011-10-03 DIAGNOSIS — R0600 Dyspnea, unspecified: Secondary | ICD-10-CM

## 2011-10-03 DIAGNOSIS — J441 Chronic obstructive pulmonary disease with (acute) exacerbation: Secondary | ICD-10-CM

## 2011-10-03 LAB — BASIC METABOLIC PANEL
BUN: 19 mg/dL (ref 6–23)
Calcium: 8.9 mg/dL (ref 8.4–10.5)
Chloride: 98 mEq/L (ref 96–112)
Creatinine, Ser: 0.8 mg/dL (ref 0.4–1.2)
Glucose, Bld: 115 mg/dL — ABNORMAL HIGH (ref 70–99)
Potassium: 4.4 mEq/L (ref 3.5–5.1)
Sodium: 139 mEq/L (ref 135–145)

## 2011-10-03 LAB — BRAIN NATRIURETIC PEPTIDE: Pro B Natriuretic peptide (BNP): 60 pg/mL (ref 0.0–100.0)

## 2011-10-03 NOTE — Patient Instructions (Addendum)
Return back to Lasix 40mg  1 1/2 daily ,  For next 2 days take Lasix 40mg  2 tabs daily for 3 days   Increase Prednisone 20mg  daily for 1 week then back to 10 mg daily   Mucinex DM Twice daily  As needed  Cough/congestion  Fluids and rest  follow up 2 months and  As needed   Please contact office for sooner follow up if symptoms do not improve or worsen or seek emergency care

## 2011-10-03 NOTE — Progress Notes (Signed)
Subjective:    Patient ID: [name], female    DOB: 1924/04/03, 78 y.o.   MRN: [id number]  HPI 47 yowf quit smoking 2002 with onset doe and a mild chronic cough. She subsequently gained 15 pounds and beginning about the middle of 2006 began to have exacerbations with coughing wheezing and shortness of breath which required short course of prednisone to get her back to baseline but requiring Prednisone daily since 3/09.   December 22, 2007 ov no decrease in ex tol albeit on oxygen at 24 hours a day and maintaining prednisone at 10 mg daily with gradual improvement in dyspnea.   March 16, 2010  c/o sharp pain substernally radiating back along lower rib cage, c/o dyspnea , no wheeze, no signs of cold - Not taking spiriva  Steroid dependent, takes pulmicort & duonebs  Told at [facility] that she has clogged arteries causing abdominal pain  EKG - nSR with RBBB& left bifascicular block - q waves in V1-2 related to BBB ? vs old MI   April 03, 2010 --Presents for follow up. Had fall 3 weeks ago. She lost her balance in closet 10/29 and fell forward. Had bilateral sharp rib pain w/ radiation into back. CXR w/ no acute changes. Seen at urgent care 2 weeks go, tx w/ advil. She is no better. Has sudden sharp pain in ribs bialterally w/ pain into back-catching pain w/ sudden movement. Denies chest pain, dyspnea, orthopnea, hemoptysis, fever, n/v/d, edema, headache. Her to breathe in at times. dx T 9 fx > Deveshwar eval, no rx.   April 20, 2010 ov cc sob x 3 weeks, sob at rest, no unusual cough/ congestion, does ok sleeping . Increased pred back to 20 mg per day with no benefit. no purulent sputum. back better, no leg c/ow just gen fatigue. Pt denies any significant sore throat, nasal congestion or excess secretions, fever, chills, sweats, unintended wt loss, pleuritic or exertional cp, orthopnea pnd or leg swelling.   Jan 2012 Hosp much better and 10 days of avelox >  Then cipro and able prednisone     08/22/10 Mid march prednisone up to 40 and another round of cipro >  Looser less yellow and pred down to 10 mg per day.  Confused with meds/ instructions and maint vs prn.   >>rx perforomoist and spiriva   09/19/10 Acute OV Complains of prod cough with yellow mucus (clearing), wheezing - states feels "about the same" even with doxy rx given 5.4.12.  daughter reports increased pred to 20mg  on 5.4.12. She is some better and mucus is mostly clear.Marland Kitchen She was at beach last week when cough got worse. Cant stop coughing at times. Worse at night. >>rec to finish doxyc, and steroid bump to 40mg  w/ taper to hold at 20mg    10/04/10 Follow up  2 week follow up. Last ov w/ AECOPD. She was tx with doxyc and steroid bump at 40mg  . She states wheezing and cough have improved, still having clear mucus production and weakness. Feels a lot better. Continues to have no energy.  No discolored mucus or fever. No increased swelling.  She is currently at 20mg  of prednsione daily   11/21/2010 Acute OV  Pt presents for an acute office visit. Complains of increased SOB, wheezing, prod cough with yellow/brown mucus x 4 days.  Pt is here with her daughter today. 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 She was having significant abdominal pain w/  n/v and weight loss over last several weeks -she is feeling so much better w/ improved appetite and resolution of abdominal pain. She started with cough and congestion after discharge. No fever or chest pain. No increased edema .  SHe is currently on prednisone 20mg  daily . She is now on plavix .Denies hemoptysis. >>Levaquin /steroid taper.   8/16//2012 Follow up  Returns for 1 month follow up . She has AECOPD last ov , tx w/ Levaquin and steroid taper . Says she is feeling better back to her baseline. Currently on pred 10mg  daily . No flare in cough or dyspnea. No leg swelling . No fever.  Says she is off Spiriva- messed with her eyes in past.   >>no changes   02/21/2011 Acute OV  Complains of cough , congestion , wheezing for 2 weeks. Took Cipro 7 days and steroid burst (  left over from daughter rx ). She got better but then cough returned 3 days ago, restarted Cipro 500mg  . Still has some cough , but much better. Currently on prednisone 10mg  daily . Better but not totally over symptoms. Daughter Alona Bene) is leaving to go out of town and would like her to be checked. No hemoptysis or edema  >>  05/28/2011 [facility]  Admitted December 27 2 05/18/2011 for acute COPD exacerbation, urinary tract infection, influenza, acute on chronic respiratory failure. The patient presented initially with acute confusion, hypoxia, and weakness, with fever of 103.5. She was started on Tamiflu. IV antibiotics and IV steroids. She was treated with nebulizer therapy. She was discharged on Keflex, which she has now completed. Urine culture showed Escherichia coli and Klebsiella UTI Since discharge. Patient continues to feel somewhat weak. However, her breathing has improved with decreased cough and congestion. She is currently on a prednisone taper at 30 mg and will be changing over to 20 mg tomorrow. Her daughter is with her today and is concerned that she continues to have a urinary tract infection. Because of complaints of intermittent dysuria and urgency. She denies any fever, chest pain, hemoptysis, or leg swelling.   08/21/2011 Acute OV / NP Patient presents for possible pneumonia. Pt c/o cough with thick green/yellow mucus, sob, wheezing, and weakness x 10 days Also night sweats.  Denies fever, chest pain and chest tightness. Took Doxycycline x  7 days by PCP , finished last dose yesterday  Not helping. No fever or leg swelling Good appetite. No urinary symptoms. Currently on prednisone 20mg  daily  rec Avelox 400mg  daily for 7 days -take with food-sample given  Mucinex DM Twice daily  As needed  Cough/congesiton  Continue on Prednisone 20mg  daily for  now   09/04/2011 f/u ov/Wert cc mucus still a bit yellow p 8/10 cipro with still thick mucus but breathing better and less need for daytime saba rescue.  N >>add perforomist   10/03/11 Follow up  4 week follow up COPD.  Over last 2 weeks less energy, has some wheezing.  More swelling in legs , weight is trending up .  No increased cough or discolored mucus. No fever.  Last ov perforomist added but she is unable to tolerate.  Has only been taking lasix 1 daily            ROS:  Constitutional:   No  weight loss, night sweats,  Fevers, chills,  +fatigue, or  lassitude.  HEENT:   No headaches,  Difficulty swallowing,  Tooth/dental problems, or  Sore throat,  No sneezing, itching, ear ache, nasal congestion, post nasal drip,   CV:  No chest pain,  Orthopnea, PND,   anasarca, dizziness, palpitations, syncope.   GI  No heartburn, indigestion, abdominal pain, nausea, vomiting, diarrhea, change in bowel habits, loss of appetite, bloody stools.   Resp:    No coughing up of blood.   No chest wall deformity  Skin: no rash or lesions.  GU: no dysuria, change in color of urine, no urgency or frequency.  No flank pain, no hematuria   MS:  No joint pain or swelling.  No decreased range of motion.  No back pain.  Psych:  No change in mood or affect. No depression or anxiety.  No memory loss.                   Objective:   Physical Exam  GEN: A/Ox3; pleasant , NAD, frail and elderly   HEENT:  [place]/AT,  EACs-clear, TMs-wnl, NOSE-clear, THROAT-clear, no lesions, no postnasal drip or exudate noted.   NECK:  Supple w/ fair ROM; no JVD; normal carotid impulses w/o bruits; no thyromegaly or nodules palpated; no lymphadenopathy.  RESP  Coarse BS w/ no wheezing   no accessory muscle use, no dullness to percussion  CARD:  RRR, no m/r/g  , tr peripheral edema, pulses intact, no cyanosis or clubbing.  GI:   Soft & nt; nml bowel sounds; no organomegaly or masses  detected. Neg CVA tenderness   Musco: Warm bil, no deformities or joint swelling noted.   Neuro: alert, no focal deficits noted.    Skin: Warm, no lesions or rashes      08/21/11  COPD/chronic changes. Cardiomegaly.  No acute findings or change.    Assessment & Plan:

## 2011-10-05 NOTE — Assessment & Plan Note (Addendum)
Mild flare with suspected volume overload Labs w/ bnp and bmet   Plan:  Return back to Lasix 40mg  1 1/2 daily ,  For next 2 days take Lasix 40mg  2 tabs daily for 3 days   Increase Prednisone 20mg  daily for 1 week then back to 10 mg daily   Mucinex DM Twice daily  As needed  Cough/congestion  Fluids and rest  follow up 2 months and  As needed   Please contact office for sooner follow up if symptoms do not improve or worsen or seek emergency care

## 2011-11-27 ENCOUNTER — Ambulatory Visit (INDEPENDENT_AMBULATORY_CARE_PROVIDER_SITE_OTHER): Payer: Medicare Other | Admitting: Adult Health

## 2011-11-27 ENCOUNTER — Encounter: Payer: Self-pay | Admitting: Adult Health

## 2011-11-27 VITALS — BP 118/66 | HR 67 | Temp 97.1°F | Ht 61.0 in | Wt 144.8 lb

## 2011-11-27 DIAGNOSIS — J449 Chronic obstructive pulmonary disease, unspecified: Secondary | ICD-10-CM

## 2011-11-27 NOTE — Progress Notes (Signed)
Subjective:    Patient ID: [name], female    DOB: 1924/04/03, 78 y.o.   MRN: [id number]  HPI 47 yowf quit smoking 2002 with onset doe and a mild chronic cough. She subsequently gained 15 pounds and beginning about the middle of 2006 began to have exacerbations with coughing wheezing and shortness of breath which required short course of prednisone to get her back to baseline but requiring Prednisone daily since 3/09.   December 22, 2007 ov no decrease in ex tol albeit on oxygen at 24 hours a day and maintaining prednisone at 10 mg daily with gradual improvement in dyspnea.   March 16, 2010  c/o sharp pain substernally radiating back along lower rib cage, c/o dyspnea , no wheeze, no signs of cold - Not taking spiriva  Steroid dependent, takes pulmicort & duonebs  Told at [facility] that she has clogged arteries causing abdominal pain  EKG - nSR with RBBB& left bifascicular block - q waves in V1-2 related to BBB ? vs old MI   April 03, 2010 --Presents for follow up. Had fall 3 weeks ago. She lost her balance in closet 10/29 and fell forward. Had bilateral sharp rib pain w/ radiation into back. CXR w/ no acute changes. Seen at urgent care 2 weeks go, tx w/ advil. She is no better. Has sudden sharp pain in ribs bialterally w/ pain into back-catching pain w/ sudden movement. Denies chest pain, dyspnea, orthopnea, hemoptysis, fever, n/v/d, edema, headache. Her to breathe in at times. dx T 9 fx > Deveshwar eval, no rx.   April 20, 2010 ov cc sob x 3 weeks, sob at rest, no unusual cough/ congestion, does ok sleeping . Increased pred back to 20 mg per day with no benefit. no purulent sputum. back better, no leg c/ow just gen fatigue. Pt denies any significant sore throat, nasal congestion or excess secretions, fever, chills, sweats, unintended wt loss, pleuritic or exertional cp, orthopnea pnd or leg swelling.   Jan 2012 Hosp much better and 10 days of avelox >  Then cipro and able prednisone     08/22/10 Mid march prednisone up to 40 and another round of cipro >  Looser less yellow and pred down to 10 mg per day.  Confused with meds/ instructions and maint vs prn.   >>rx perforomoist and spiriva   09/19/10 Acute OV Complains of prod cough with yellow mucus (clearing), wheezing - states feels "about the same" even with doxy rx given 5.4.12.  daughter reports increased pred to 20mg  on 5.4.12. She is some better and mucus is mostly clear.Marland Kitchen She was at beach last week when cough got worse. Cant stop coughing at times. Worse at night. >>rec to finish doxyc, and steroid bump to 40mg  w/ taper to hold at 20mg    10/04/10 Follow up  2 week follow up. Last ov w/ AECOPD. She was tx with doxyc and steroid bump at 40mg  . She states wheezing and cough have improved, still having clear mucus production and weakness. Feels a lot better. Continues to have no energy.  No discolored mucus or fever. No increased swelling.  She is currently at 20mg  of prednsione daily   11/21/2010 Acute OV  Pt presents for an acute office visit. Complains of increased SOB, wheezing, prod cough with yellow/brown mucus x 4 days.  Pt is here with her daughter today. 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 She was having significant abdominal pain w/  n/v and weight loss over last several weeks -she is feeling so much better w/ improved appetite and resolution of abdominal pain. She started with cough and congestion after discharge. No fever or chest pain. No increased edema .  SHe is currently on prednisone 20mg  daily . She is now on plavix .Denies hemoptysis. >>Levaquin /steroid taper.   8/16//2012 Follow up  Returns for 1 month follow up . She has AECOPD last ov , tx w/ Levaquin and steroid taper . Says she is feeling better back to her baseline. Currently on pred 10mg  daily . No flare in cough or dyspnea. No leg swelling . No fever.  Says she is off Spiriva- messed with her eyes in past.   >>no changes   02/21/2011 Acute OV  Complains of cough , congestion , wheezing for 2 weeks. Took Cipro 7 days and steroid burst (  left over from daughter rx ). She got better but then cough returned 3 days ago, restarted Cipro 500mg  . Still has some cough , but much better. Currently on prednisone 10mg  daily . Better but not totally over symptoms. Daughter Alona Bene) is leaving to go out of town and would like her to be checked. No hemoptysis or edema  >>  05/28/2011 [facility]  Admitted December 27 2 05/18/2011 for acute COPD exacerbation, urinary tract infection, influenza, acute on chronic respiratory failure. The patient presented initially with acute confusion, hypoxia, and weakness, with fever of 103.5. She was started on Tamiflu. IV antibiotics and IV steroids. She was treated with nebulizer therapy. She was discharged on Keflex, which she has now completed. Urine culture showed Escherichia coli and Klebsiella UTI Since discharge. Patient continues to feel somewhat weak. However, her breathing has improved with decreased cough and congestion. She is currently on a prednisone taper at 30 mg and will be changing over to 20 mg tomorrow. Her daughter is with her today and is concerned that she continues to have a urinary tract infection. Because of complaints of intermittent dysuria and urgency. She denies any fever, chest pain, hemoptysis, or leg swelling.   08/21/2011 Acute OV / NP Patient presents for possible pneumonia. Pt c/o cough with thick green/yellow mucus, sob, wheezing, and weakness x 10 days Also night sweats.  Denies fever, chest pain and chest tightness. Took Doxycycline x  7 days by PCP , finished last dose yesterday  Not helping. No fever or leg swelling Good appetite. No urinary symptoms. Currently on prednisone 20mg  daily  rec Avelox 400mg  daily for 7 days -take with food-sample given  Mucinex DM Twice daily  As needed  Cough/congesiton  Continue on Prednisone 20mg  daily for  now   09/04/2011 f/u ov/Wert cc mucus still a bit yellow p 8/10 cipro with still thick mucus but breathing better and less need for daytime saba rescue.  N >>add perforomist   10/03/11 Follow up  4 week follow up COPD.  Over last 2 weeks less energy, has some wheezing.  More swelling in legs , weight is trending up .  No increased cough or discolored mucus. No fever.  Last ov perforomist added but she is unable to tolerate.  Has only been taking lasix 1 daily  >>lasix increased x 2 days then back to 60mg    BNP nml     11/27/2011 Follow up  Returns for 2 month follow up for COPD  had increased SOB "episode" 3 weeks ago, and tapered prednisone > better Back to her baseline now , on prednisone  10mg  daily  No fever or chest pain .  No increased leg swelling . No orthopnea   Very sedentary at home .          ROS:  Constitutional:   No  weight loss, night sweats,  Fevers, chills,  +fatigue, or  lassitude.  HEENT:   No headaches,  Difficulty swallowing,  Tooth/dental problems, or  Sore throat,                No sneezing, itching, ear ache, nasal congestion, post nasal drip,   CV:  No chest pain,  Orthopnea, PND,   anasarca, dizziness, palpitations, syncope.   GI  No heartburn, indigestion, abdominal pain, nausea, vomiting, diarrhea, change in bowel habits, loss of appetite, bloody stools.   Resp:    No coughing up of blood.   No chest wall deformity  Skin: no rash or lesions.  GU: no dysuria, change in color of urine, no urgency or frequency.  No flank pain, no hematuria   MS:  No joint pain or swelling.  No decreased range of motion.  No back pain.  Psych:  No change in mood or affect. No depression or anxiety.  No memory loss.                   Objective:   Physical Exam  GEN: A/Ox3; pleasant , NAD, frail and elderly   HEENT:  [place]/AT,  EACs-clear, TMs-wnl, NOSE-clear, THROAT-clear, no lesions, no postnasal drip or exudate noted.   NECK:  Supple w/ fair ROM;  no JVD; normal carotid impulses w/o bruits; no thyromegaly or nodules palpated; no lymphadenopathy.  RESP  Coarse BS w/ no wheezing   no accessory muscle use, no dullness to percussion  CARD:  RRR, no m/r/g  ,no  peripheral edema, pulses intact, no cyanosis or clubbing.  GI:   Soft & nt; nml bowel sounds; no organomegaly or masses detected. Neg CVA tenderness   Musco: Warm bil, no deformities or joint swelling noted.   Neuro: alert, no focal deficits noted.    Skin: Warm, no lesions or rashes      08/21/11  COPD/chronic changes. Cardiomegaly.  No acute findings or change.    Assessment & Plan:

## 2011-11-27 NOTE — Assessment & Plan Note (Signed)
Compensated on present regimen  No changes  Please contact office for sooner follow up if symptoms do not improve or worsen or seek emergency care

## 2011-11-27 NOTE — Patient Instructions (Addendum)
Continue on same regimen follow up 2 months and As needed

## 2011-12-01 ENCOUNTER — Telehealth: Payer: Self-pay | Admitting: Internal Medicine

## 2011-12-01 MED ORDER — CIPROFLOXACIN HCL 500 MG PO TABS: 500.0000 mg | ORAL_TABLET | Freq: Two times a day (BID) | ORAL | Status: AC

## 2011-12-01 NOTE — Telephone Encounter (Signed)
Woke up with dark yellow sputum, already doubled dose of main pred and rx with mucinex and requesting round of cipro, called in > to ER over weekend if condition deteriorates

## 2012-01-29 ENCOUNTER — Ambulatory Visit: Payer: Medicare Other | Admitting: Adult Health

## 2012-02-18 ENCOUNTER — Ambulatory Visit: Payer: Medicare Other | Admitting: Adult Health

## 2012-02-19 ENCOUNTER — Ambulatory Visit (INDEPENDENT_AMBULATORY_CARE_PROVIDER_SITE_OTHER): Payer: Medicare Other | Admitting: Adult Health

## 2012-02-19 ENCOUNTER — Encounter: Payer: Self-pay | Admitting: Adult Health

## 2012-02-19 VITALS — BP 140/68 | HR 74 | Temp 97.1°F | Ht 61.0 in | Wt 149.6 lb

## 2012-02-19 DIAGNOSIS — J449 Chronic obstructive pulmonary disease, unspecified: Secondary | ICD-10-CM

## 2012-02-19 DIAGNOSIS — J4489 Other specified chronic obstructive pulmonary disease: Secondary | ICD-10-CM

## 2012-02-19 NOTE — Patient Instructions (Addendum)
Continue on current regimen.  Follow up 2 months Dr. Sherene Sires  And As needed   Please contact office for sooner follow up if symptoms do not improve or worsen or seek emergency care

## 2012-02-22 ENCOUNTER — Telehealth: Payer: Self-pay | Admitting: Internal Medicine

## 2012-02-22 MED ORDER — DOXYCYCLINE HYCLATE 100 MG PO TABS: 100.0000 mg | ORAL_TABLET | Freq: Two times a day (BID) | ORAL | Status: DC

## 2012-02-22 MED ORDER — AZITHROMYCIN 250 MG PO TABS: ORAL_TABLET | ORAL | Status: DC

## 2012-02-22 NOTE — Telephone Encounter (Signed)
Per MW- okay to call in zpack Rx was called in, and per pharmacist copay is 5.65$.  Spoke with Alona Bene and notified of this and she verbalized understanding and states nothing further needed.

## 2012-02-22 NOTE — Telephone Encounter (Signed)
Pt last seen 02-19-12 and was having some wheezing and increased SOB but no cough at that time. Pt daughter states the pt has developed a productive cough with yellow phlegm along with the other symptoms. Pt daughter states TP advised if this occurred to call for antibiotic. Please advise.[name], CMA Allergies  Allergen Reactions  . Levofloxacin     Unable to sleep, pt stated that she felt crazy--only took for 3 days  . Statins   . Sulfonamide Derivatives   . Penicillins Rash

## 2012-02-22 NOTE — Telephone Encounter (Signed)
Doxycycline 100 mg twice daily x 10 days  

## 2012-02-22 NOTE — Telephone Encounter (Signed)
Looks like doxycyline was called in for pt. Per daughter this is too expensive. Please advise Dr. Sherene Sires thanks  Allergies  Allergen Reactions  . Levofloxacin     Unable to sleep, pt stated that she felt crazy--only took for 3 days  . Statins   . Sulfonamide Derivatives   . Penicillins Rash

## 2012-02-22 NOTE — Telephone Encounter (Signed)
Pt's daughter was in office (with her dad) moments ago. I tried calling triage but nurses were on phones with other pt's. Daughter asks that nurse call her back asap as she is leaving town very soon. I have verified that # is correct (she doesn't answer phone when driving). Hazel Sams

## 2012-02-22 NOTE — Telephone Encounter (Signed)
ATC # listed line rang several times w/o a response. WCB

## 2012-02-22 NOTE — Telephone Encounter (Signed)
Rx for doxy was sent [facility] for daughter to be made aware

## 2012-02-22 NOTE — Telephone Encounter (Signed)
lmtcb x1 for [name]

## 2012-02-22 NOTE — Telephone Encounter (Signed)
I called and spoke with the pharmacist and the doxy is 68$.  Will forward to doc on call to see what to change this to Please advise thanks! Allergies  Allergen Reactions  . Levofloxacin     Unable to sleep, pt stated that she felt crazy--only took for 3 days  . Statins   . Sulfonamide Derivatives   . Penicillins Rash

## 2012-02-22 NOTE — Telephone Encounter (Signed)
If this is more than 10 dollars they are using the wrong pharmacy - it's the cheapest abx made assuming they got the generic form

## 2012-02-24 ENCOUNTER — Emergency Department (HOSPITAL_COMMUNITY): Payer: Medicare Other

## 2012-02-24 ENCOUNTER — Inpatient Hospital Stay (HOSPITAL_COMMUNITY)
Admission: EM | Admit: 2012-02-24 | Discharge: 2012-02-28 | DRG: 190 | Disposition: A | Discharge status: Skilled Nursing Facility | Payer: Medicare Other | Attending: Internal Medicine | Admitting: Internal Medicine

## 2012-02-24 ENCOUNTER — Encounter (HOSPITAL_COMMUNITY): Payer: Self-pay | Admitting: *Deleted

## 2012-02-24 DIAGNOSIS — J9610 Chronic respiratory failure, unspecified whether with hypoxia or hypercapnia: Secondary | ICD-10-CM | POA: Diagnosis present

## 2012-02-24 DIAGNOSIS — Z87891 Personal history of nicotine dependence: Secondary | ICD-10-CM

## 2012-02-24 DIAGNOSIS — Z880 Allergy status to penicillin: Secondary | ICD-10-CM

## 2012-02-24 DIAGNOSIS — F39 Unspecified mood [affective] disorder: Secondary | ICD-10-CM | POA: Diagnosis present

## 2012-02-24 DIAGNOSIS — Z882 Allergy status to sulfonamides status: Secondary | ICD-10-CM

## 2012-02-24 DIAGNOSIS — R7309 Other abnormal glucose: Secondary | ICD-10-CM

## 2012-02-24 DIAGNOSIS — J9600 Acute respiratory failure, unspecified whether with hypoxia or hypercapnia: Secondary | ICD-10-CM | POA: Diagnosis present

## 2012-02-24 DIAGNOSIS — K219 Gastro-esophageal reflux disease without esophagitis: Secondary | ICD-10-CM | POA: Diagnosis present

## 2012-02-24 DIAGNOSIS — J9620 Acute and chronic respiratory failure, unspecified whether with hypoxia or hypercapnia: Secondary | ICD-10-CM | POA: Diagnosis present

## 2012-02-24 DIAGNOSIS — Z836 Family history of other diseases of the respiratory system: Secondary | ICD-10-CM

## 2012-02-24 DIAGNOSIS — I2510 Atherosclerotic heart disease of native coronary artery without angina pectoris: Secondary | ICD-10-CM | POA: Diagnosis present

## 2012-02-24 DIAGNOSIS — Z794 Long term (current) use of insulin: Secondary | ICD-10-CM

## 2012-02-24 DIAGNOSIS — Z7902 Long term (current) use of antithrombotics/antiplatelets: Secondary | ICD-10-CM

## 2012-02-24 DIAGNOSIS — IMO0002 Reserved for concepts with insufficient information to code with codable children: Secondary | ICD-10-CM

## 2012-02-24 DIAGNOSIS — I10 Essential (primary) hypertension: Secondary | ICD-10-CM

## 2012-02-24 DIAGNOSIS — E039 Hypothyroidism, unspecified: Secondary | ICD-10-CM | POA: Diagnosis present

## 2012-02-24 DIAGNOSIS — Z888 Allergy status to other drugs, medicaments and biological substances status: Secondary | ICD-10-CM

## 2012-02-24 DIAGNOSIS — R030 Elevated blood-pressure reading, without diagnosis of hypertension: Secondary | ICD-10-CM | POA: Diagnosis present

## 2012-02-24 DIAGNOSIS — J449 Chronic obstructive pulmonary disease, unspecified: Secondary | ICD-10-CM

## 2012-02-24 DIAGNOSIS — M810 Age-related osteoporosis without current pathological fracture: Secondary | ICD-10-CM | POA: Diagnosis present

## 2012-02-24 DIAGNOSIS — Z7982 Long term (current) use of aspirin: Secondary | ICD-10-CM

## 2012-02-24 DIAGNOSIS — J441 Chronic obstructive pulmonary disease with (acute) exacerbation: Principal | ICD-10-CM | POA: Diagnosis present

## 2012-02-24 DIAGNOSIS — Z79899 Other long term (current) drug therapy: Secondary | ICD-10-CM

## 2012-02-24 DIAGNOSIS — R0902 Hypoxemia: Secondary | ICD-10-CM | POA: Diagnosis present

## 2012-02-24 DIAGNOSIS — E785 Hyperlipidemia, unspecified: Secondary | ICD-10-CM | POA: Diagnosis present

## 2012-02-24 DIAGNOSIS — N390 Urinary tract infection, site not specified: Secondary | ICD-10-CM

## 2012-02-24 DIAGNOSIS — E119 Type 2 diabetes mellitus without complications: Secondary | ICD-10-CM | POA: Diagnosis present

## 2012-02-24 HISTORY — DX: Hypothyroidism, unspecified: E03.9

## 2012-02-24 HISTORY — DX: Chronic obstructive pulmonary disease, unspecified: J44.9

## 2012-02-24 HISTORY — DX: Chronic respiratory failure, unspecified whether with hypoxia or hypercapnia: J96.10

## 2012-02-24 HISTORY — DX: Hyperlipidemia, unspecified: E78.5

## 2012-02-24 HISTORY — DX: Age-related osteoporosis without current pathological fracture: M81.0

## 2012-02-24 LAB — BASIC METABOLIC PANEL
CO2: 29 mEq/L (ref 19–32)
Chloride: 95 mEq/L — ABNORMAL LOW (ref 96–112)
Glucose, Bld: 284 mg/dL — ABNORMAL HIGH (ref 70–99)
Potassium: 4 mEq/L (ref 3.5–5.1)
Sodium: 135 mEq/L (ref 135–145)

## 2012-02-24 LAB — CBC WITH DIFFERENTIAL/PLATELET
Basophils Absolute: 0 10*3/uL (ref 0.0–0.1)
Basophils Relative: 0 % (ref 0–1)
Lymphocytes Relative: 6 % — ABNORMAL LOW (ref 12–46)
MCHC: 34.3 g/dL (ref 30.0–36.0)
Neutro Abs: 13.2 10*3/uL — ABNORMAL HIGH (ref 1.7–7.7)
Platelets: 207 10*3/uL (ref 150–400)
RDW: 12.8 % (ref 11.5–15.5)
WBC: 14.7 10*3/uL — ABNORMAL HIGH (ref 4.0–10.5)

## 2012-02-24 LAB — LACTIC ACID, PLASMA: Lactic Acid, Venous: 3.3 mmol/L — ABNORMAL HIGH (ref 0.5–2.2)

## 2012-02-24 LAB — URINALYSIS, ROUTINE W REFLEX MICROSCOPIC
Glucose, UA: NEGATIVE mg/dL
Hgb urine dipstick: NEGATIVE
Leukocytes, UA: NEGATIVE
Specific Gravity, Urine: 1.006 (ref 1.005–1.030)
pH: 5 (ref 5.0–8.0)

## 2012-02-24 LAB — GLUCOSE, CAPILLARY: Glucose-Capillary: 340 mg/dL — ABNORMAL HIGH (ref 70–99)

## 2012-02-24 LAB — POCT I-STAT TROPONIN I: Troponin i, poc: 0.01 ng/mL (ref 0.00–0.08)

## 2012-02-24 MED ORDER — INSULIN ASPART 100 UNIT/ML ~~LOC~~ SOLN
0.0000 [IU] | Freq: Three times a day (TID) | SUBCUTANEOUS | Status: DC
  Administered 2012-02-25: 2 [IU] via SUBCUTANEOUS
  Administered 2012-02-25: 5 [IU] via SUBCUTANEOUS
  Administered 2012-02-25: 11 [IU] via SUBCUTANEOUS
  Administered 2012-02-26: 5 [IU] via SUBCUTANEOUS
  Administered 2012-02-26: 11 [IU] via SUBCUTANEOUS
  Administered 2012-02-26: 3 [IU] via SUBCUTANEOUS
  Administered 2012-02-27: 8 [IU] via SUBCUTANEOUS

## 2012-02-24 MED ORDER — ACETAMINOPHEN 650 MG RE SUPP: 650.0000 mg | Freq: Four times a day (QID) | RECTAL | Status: DC | PRN

## 2012-02-24 MED ORDER — HYDROCODONE-ACETAMINOPHEN 5-325 MG PO TABS
1.0000 | ORAL_TABLET | ORAL | Status: DC | PRN
  Filled 2012-02-24: qty 2

## 2012-02-24 MED ORDER — INSULIN ASPART 100 UNIT/ML ~~LOC~~ SOLN
0.0000 [IU] | Freq: Every day | SUBCUTANEOUS | Status: DC
  Administered 2012-02-25: 2 [IU] via SUBCUTANEOUS
  Administered 2012-02-25: 4 [IU] via SUBCUTANEOUS
  Administered 2012-02-26: 2 [IU] via SUBCUTANEOUS

## 2012-02-24 MED ORDER — INSULIN ASPART PROT & ASPART (70-30 MIX) 100 UNIT/ML ~~LOC~~ SUSP
34.0000 [IU] | Freq: Every day | SUBCUTANEOUS | Status: DC
  Administered 2012-02-25 – 2012-02-27 (×3): 34 [IU] via SUBCUTANEOUS
  Filled 2012-02-24 (×2): qty 3

## 2012-02-24 MED ORDER — DIAZEPAM 5 MG/ML IJ SOLN
2.0000 mg | Freq: Once | INTRAMUSCULAR | Status: AC
  Administered 2012-02-24: 2 mg via INTRAVENOUS
  Filled 2012-02-24: qty 2

## 2012-02-24 MED ORDER — IPRATROPIUM BROMIDE 0.02 % IN SOLN
RESPIRATORY_TRACT | Status: AC
  Administered 2012-02-24: 0.5 mg via RESPIRATORY_TRACT
  Filled 2012-02-24: qty 2.5

## 2012-02-24 MED ORDER — IOHEXOL 300 MG/ML  SOLN
20.0000 mL | INTRAMUSCULAR | Status: AC
  Administered 2012-02-24: 20 mL via ORAL

## 2012-02-24 MED ORDER — SERTRALINE HCL 50 MG PO TABS
50.0000 mg | ORAL_TABLET | Freq: Every day | ORAL | Status: DC
  Administered 2012-02-25 – 2012-02-28 (×4): 50 mg via ORAL
  Filled 2012-02-24 (×4): qty 1

## 2012-02-24 MED ORDER — MAGNESIUM OXIDE 400 (241.3 MG) MG PO TABS
400.0000 mg | ORAL_TABLET | Freq: Every day | ORAL | Status: DC
  Administered 2012-02-25 – 2012-02-28 (×4): 400 mg via ORAL
  Filled 2012-02-24 (×4): qty 1

## 2012-02-24 MED ORDER — ALBUTEROL SULFATE (5 MG/ML) 0.5% IN NEBU
INHALATION_SOLUTION | RESPIRATORY_TRACT | Status: AC
  Administered 2012-02-24: 2.5 mg via RESPIRATORY_TRACT
  Filled 2012-02-24: qty 0.5

## 2012-02-24 MED ORDER — ONDANSETRON HCL 4 MG PO TABS: 4.0000 mg | ORAL_TABLET | Freq: Four times a day (QID) | ORAL | Status: DC | PRN

## 2012-02-24 MED ORDER — LEVOTHYROXINE SODIUM 88 MCG PO TABS
88.0000 ug | ORAL_TABLET | Freq: Every day | ORAL | Status: DC
  Administered 2012-02-25 – 2012-02-28 (×4): 88 ug via ORAL
  Filled 2012-02-24 (×6): qty 1

## 2012-02-24 MED ORDER — IPRATROPIUM BROMIDE 0.02 % IN SOLN
RESPIRATORY_TRACT | Status: AC
  Administered 2012-02-24: 0.5 mg
  Filled 2012-02-24: qty 2.5

## 2012-02-24 MED ORDER — RACEPINEPHRINE HCL 2.25 % IN NEBU
0.5000 mL | INHALATION_SOLUTION | Freq: Once | RESPIRATORY_TRACT | Status: AC
  Administered 2012-02-24: 0.5 mL via RESPIRATORY_TRACT
  Filled 2012-02-24: qty 0.5

## 2012-02-24 MED ORDER — GUAIFENESIN ER 600 MG PO TB12
600.0000 mg | ORAL_TABLET | Freq: Two times a day (BID) | ORAL | Status: DC
Start: 2012-02-24 — End: 2012-02-28
  Administered 2012-02-25 – 2012-02-28 (×8): 600 mg via ORAL
  Filled 2012-02-24 (×10): qty 1

## 2012-02-24 MED ORDER — IPRATROPIUM BROMIDE 0.02 % IN SOLN
0.5000 mg | RESPIRATORY_TRACT | Status: DC | PRN
  Administered 2012-02-24 – 2012-02-25 (×3): 0.5 mg via RESPIRATORY_TRACT
  Filled 2012-02-24 (×2): qty 2.5

## 2012-02-24 MED ORDER — METHYLPREDNISOLONE SODIUM SUCC 125 MG IJ SOLR
125.0000 mg | Freq: Once | INTRAMUSCULAR | Status: AC
  Administered 2012-02-24: 125 mg via INTRAVENOUS
  Filled 2012-02-24: qty 2

## 2012-02-24 MED ORDER — CLOPIDOGREL BISULFATE 75 MG PO TABS
75.0000 mg | ORAL_TABLET | Freq: Every day | ORAL | Status: DC
  Administered 2012-02-25 – 2012-02-28 (×4): 75 mg via ORAL
  Filled 2012-02-24 (×4): qty 1

## 2012-02-24 MED ORDER — IPRATROPIUM BROMIDE 0.02 % IN SOLN
0.5000 mg | Freq: Once | RESPIRATORY_TRACT | Status: AC
  Administered 2012-02-24: 0.5 mg via RESPIRATORY_TRACT
  Filled 2012-02-24: qty 2.5

## 2012-02-24 MED ORDER — ACETAMINOPHEN 325 MG PO TABS: 650.0000 mg | ORAL_TABLET | Freq: Four times a day (QID) | ORAL | Status: DC | PRN

## 2012-02-24 MED ORDER — SENNOSIDES-DOCUSATE SODIUM 8.6-50 MG PO TABS
1.0000 | ORAL_TABLET | Freq: Every evening | ORAL | Status: DC | PRN
  Administered 2012-02-25: 1 via ORAL
  Filled 2012-02-24: qty 1

## 2012-02-24 MED ORDER — METHYLPREDNISOLONE SODIUM SUCC 40 MG IJ SOLR
40.0000 mg | Freq: Two times a day (BID) | INTRAMUSCULAR | Status: DC
  Administered 2012-02-24 – 2012-02-26 (×4): 40 mg via INTRAVENOUS
  Filled 2012-02-24 (×5): qty 1

## 2012-02-24 MED ORDER — MORPHINE SULFATE 4 MG/ML IJ SOLN
4.0000 mg | Freq: Once | INTRAMUSCULAR | Status: AC
  Administered 2012-02-24: 4 mg via INTRAVENOUS
  Filled 2012-02-24: qty 1

## 2012-02-24 MED ORDER — AZITHROMYCIN 500 MG IV SOLR
500.0000 mg | INTRAVENOUS | Status: DC
  Administered 2012-02-24 – 2012-02-27 (×4): 500 mg via INTRAVENOUS
  Filled 2012-02-24 (×6): qty 500

## 2012-02-24 MED ORDER — INSULIN ASPART PROT & ASPART (70-30 MIX) 100 UNIT/ML ~~LOC~~ SUSP
10.0000 [IU] | Freq: Every day | SUBCUTANEOUS | Status: DC
  Administered 2012-02-25: 10 [IU] via SUBCUTANEOUS

## 2012-02-24 MED ORDER — ALBUTEROL (5 MG/ML) CONTINUOUS INHALATION SOLN
INHALATION_SOLUTION | RESPIRATORY_TRACT | Status: AC
  Administered 2012-02-24: 2.5 mg
  Filled 2012-02-24: qty 20

## 2012-02-24 MED ORDER — PANTOPRAZOLE SODIUM 40 MG PO TBEC
40.0000 mg | DELAYED_RELEASE_TABLET | Freq: Every day | ORAL | Status: DC
  Administered 2012-02-25 – 2012-02-28 (×4): 40 mg via ORAL
  Filled 2012-02-24 (×4): qty 1

## 2012-02-24 MED ORDER — ALBUTEROL SULFATE (5 MG/ML) 0.5% IN NEBU
2.5000 mg | INHALATION_SOLUTION | RESPIRATORY_TRACT | Status: DC | PRN
  Administered 2012-02-24 – 2012-02-25 (×3): 2.5 mg via RESPIRATORY_TRACT
  Filled 2012-02-24 (×2): qty 0.5

## 2012-02-24 MED ORDER — ENOXAPARIN SODIUM 30 MG/0.3ML ~~LOC~~ SOLN
30.0000 mg | SUBCUTANEOUS | Status: DC
  Administered 2012-02-25 – 2012-02-26 (×2): 30 mg via SUBCUTANEOUS
  Filled 2012-02-24 (×3): qty 0.3

## 2012-02-24 MED ORDER — IOHEXOL 300 MG/ML  SOLN
100.0000 mL | Freq: Once | INTRAMUSCULAR | Status: AC | PRN
  Administered 2012-02-24: 100 mL via INTRAVENOUS

## 2012-02-24 MED ORDER — DEXTROSE 5 % IV SOLN
1.0000 g | INTRAVENOUS | Status: DC
  Administered 2012-02-24 – 2012-02-28 (×4): 1 g via INTRAVENOUS
  Filled 2012-02-24 (×6): qty 10

## 2012-02-24 MED ORDER — SODIUM CHLORIDE 0.9 % IJ SOLN: 3.0000 mL | INTRAMUSCULAR | Status: DC | PRN

## 2012-02-24 MED ORDER — ASPIRIN EC 81 MG PO TBEC
81.0000 mg | DELAYED_RELEASE_TABLET | Freq: Every day | ORAL | Status: DC
  Administered 2012-02-25 – 2012-02-28 (×4): 81 mg via ORAL
  Filled 2012-02-24 (×4): qty 1

## 2012-02-24 MED ORDER — SODIUM CHLORIDE 0.9 % IJ SOLN
3.0000 mL | Freq: Two times a day (BID) | INTRAMUSCULAR | Status: DC
  Administered 2012-02-25 – 2012-02-27 (×6): 3 mL via INTRAVENOUS

## 2012-02-24 MED ORDER — ALUM & MAG HYDROXIDE-SIMETH 200-200-20 MG/5ML PO SUSP: 30.0000 mL | Freq: Four times a day (QID) | ORAL | Status: DC | PRN

## 2012-02-24 MED ORDER — SENNA 8.6 MG PO TABS
2.0000 | ORAL_TABLET | Freq: Two times a day (BID) | ORAL | Status: DC
  Administered 2012-02-24 – 2012-02-28 (×8): 17.2 mg via ORAL
  Filled 2012-02-24 (×9): qty 2

## 2012-02-24 MED ORDER — ALBUTEROL SULFATE (5 MG/ML) 0.5% IN NEBU
2.5000 mg | INHALATION_SOLUTION | Freq: Once | RESPIRATORY_TRACT | Status: AC
  Administered 2012-02-24: 2.5 mg via RESPIRATORY_TRACT
  Filled 2012-02-24: qty 20

## 2012-02-24 MED ORDER — ONDANSETRON HCL 4 MG/2ML IJ SOLN: 4.0000 mg | Freq: Four times a day (QID) | INTRAMUSCULAR | Status: DC | PRN

## 2012-02-24 NOTE — ED Notes (Signed)
In and out cath. B/c pt. Urinated.

## 2012-02-24 NOTE — H&P (Signed)
PCP:   [facility]   Chief Complaint:  Shortness of breath  HPI: This is a 78 year old female with known history of COPD end stage and chronic respiratory failure on 2 L oxygen. Since this morning she's been wheezing much worse than baseline. She denies any cough, fever, chills. She is a nebulizer without any relief. She came to the ER. In the ER patient is on nasal cannula but on his 5L. Additionally, the patient complained of back pain, located over the flanks and lower back. It was worse with movement, patient had some difficulty laying flat. This is new discomfort. Currently much improved after some pain medications history provided by the patient and her son was present at the bedside. .  Review of Systems:  The patient denies anorexia, fever, weight loss,, vision loss, decreased hearing, hoarseness, chest pain, syncope, dyspnea on exertion, peripheral edema, balance deficits, hemoptysis, abdominal pain, melena, hematochezia, severe indigestion/heartburn, hematuria, incontinence, genital sores, muscle weakness, suspicious skin lesions, transient blindness, difficulty walking, depression, unusual weight change, abnormal bleeding, enlarged lymph nodes, angioedema, and breast masses.  Past Medical History: Past Medical History  Diagnosis Date  . Osteoporosis     9 Compression Fx with slt cord deformity   . Other abnormal glucose   . Hyperlipidemia   . Esophageal reflux   . COPD (chronic obstructive pulmonary disease)     On home O2 and daily Prednisone since march 2009. PFTs 09/05/07 FEV1 49% with improvement by 8% ratio 39% and diffusing capacity 58%    . Hypothyroidism   . Tobacco abuse, in remission quit 2002  . Atelectasis of right lung     chronic rt lower lung seen on cT 2010  . Chronic respiratory failure    Past Surgical History  Procedure Date  . Cholecystectomy   . Ankle fracture surgery     left 10/12/2008    Medications: Prior to Admission medications   Medication Sig  Start Date End Date Taking? Authorizing Provider  albuterol (PROVENTIL HFA) 108 (90 BASE) MCG/ACT inhaler Inhale 2 puffs into the lungs every 4 (four) hours as needed. For shortness of breath 10/12/10  Yes Tammy S Parrett, NP  albuterol (PROVENTIL) (2.5 MG/3ML) 0.083% nebulizer solution Take 2.5 mg by nebulization every 4 (four) hours as needed. For wheezing   Yes Historical Provider, MD  aspirin EC 81 MG tablet Take 81 mg by mouth daily.   Yes Historical Provider, MD  azithromycin (ZITHROMAX Z-PAK) 250 MG tablet Take as directed 02/22/12  Yes Nyoka Cowden, MD  budesonide (PULMICORT) 0.25 MG/2ML nebulizer solution Take 2 mLs (0.25 mg total) by nebulization 2 (two) times daily. 05/29/11  Yes Tammy S Parrett, NP  calcium citrate-vitamin D (CITRACAL+D) 315-200 MG-UNIT per tablet Take 2 tablets by mouth daily.     Yes Historical Provider, MD  Cholecalciferol (VITAMIN D3) 1000 UNITS tablet Take 2,000 Units by mouth 2 (two) times daily.  10/12/10  Yes Tammy S Parrett, NP  clopidogrel (PLAVIX) 75 MG tablet Take 75 mg by mouth daily.     Yes Historical Provider, MD  diazepam (VALIUM) 5 MG tablet Take 2.5 mg by mouth 2 (two) times daily as needed.  10/12/10  Yes Tammy S Parrett, NP  ferrous sulfate 325 (65 FE) MG EC tablet Take 325 mg by mouth daily.  10/12/10  Yes Tammy S Parrett, NP  furosemide (LASIX) 40 MG tablet Take 1 and 1/2 tabs by mouth once daily 10/12/10  Yes Tammy S Parrett, NP  guaiFENesin (MUCINEX) 600  MG 12 hr tablet 1-2 every 12 hour as needed for cough and congestion 09/04/11  Yes Nyoka Cowden, MD  insulin aspart (NOVOLOG) 100 UNIT/ML injection Sliding scale noon meal 5-12 units   Yes Historical Provider, MD  insulin lispro protamine-insulin lispro (HUMALOG 75/25) (75-25) 100 UNIT/ML SUSP 34 units with breakfast, 10 units with dinner   Yes Tammy S Parrett, NP  levothyroxine (SYNTHROID, LEVOTHROID) 88 MCG tablet Take 88 mcg by mouth daily.     Yes Historical Provider, MD  Magnesium 250 MG TABS  Take 1 tablet by mouth daily.     Yes Historical Provider, MD  omeprazole (PRILOSEC) 20 MG capsule Take 1 capsule by mouth 2 (two) times daily before a meal. 10/18/10  Yes Historical Provider, MD  Potassium Gluconate 595 MG CAPS 1 tablet by mouth once daily   Yes Historical Provider, MD  predniSONE (DELTASONE) 10 MG tablet Take 1 tab by mouth once daily or increase to 2 tab until back to baseline when needed for increased shortness of breath 05/18/11  Yes Barbette Or, MD  senna (SENOKOT) 8.6 MG tablet Take 2 tablets by mouth 2 (two) times daily.    Yes Historical Provider, MD  sertraline (ZOLOFT) 50 MG tablet Take 50 mg by mouth daily.    Yes Historical Provider, MD  sodium chloride (OCEAN) 0.65 % nasal spray Place 2 sprays into the nose 2 (two) times daily as needed for congestion. 10/12/10  Yes Tammy S Parrett, NP  tiotropium (SPIRIVA) 18 MCG inhalation capsule Place 18 mcg into inhaler and inhale daily.     Yes Historical Provider, MD  vitamin C (ASCORBIC ACID) 500 MG tablet Take 1,000 mg by mouth daily.     Yes Historical Provider, MD    Allergies:   Allergies  Allergen Reactions  . Levofloxacin     Unable to sleep, pt stated that she felt crazy--only took for 3 days  . Statins   . Sulfonamide Derivatives   . Penicillins Rash    Social History:  reports that she quit smoking about 11 years ago. Her smoking use included Cigarettes. She has a 75 pack-year smoking history. She does not have any smokeless tobacco history on file. She reports that she does not drink alcohol or use illicit drugs. lives at home with husband, uses a walker, 2 L of oxygen via nasal cannula  Family History: Family History  Problem Relation Age of Onset  . Emphysema Brother     multiple  . Emphysema Father     Physical Exam: Filed Vitals:   02/24/12 1440 02/24/12 1652 02/24/12 1850 02/24/12 2030  BP:  151/59 127/59 169/62  Pulse:      Temp:    97.8 F (36.6 C)  TempSrc:    Oral  Resp:  20 20 22     SpO2: 98% 99% 98% 99%    General:  Alert and oriented times three, well developed and nourished, frail-appearing female, short of breath  Eyes: PERRLA, pink conjunctiva, no scleral icterus ENT: Moist oral mucosa, neck supple, no thyromegaly Lungs: Very poor air exchange, mild wheezing o crackles, no use of accessory muscles Cardiovascular: regular rate and rhythm, no regurgitation, no gallops, no murmurs. No carotid bruits, no JVD Abdomen: soft, positive BS, non-tender, non-distended, no organomegaly, not an acute abdomen GU: not examined Neuro: CN II - XII grossly intact, sensation intact Musculoskeletal: strength 4/5 bilateral lower extremities, no clubbing, cyanosis or edema, back pain slight reproducible  Skin: no rash, no subcutaneous  crepitation, no decubitus Psych: appropriate patient   Labs on Admission:   Basename 02/24/12 1310  NA 135  K 4.0  CL 95*  CO2 29  GLUCOSE 284*  BUN 20  CREATININE 0.79  CALCIUM 9.4  MG --  PHOS --   No results found for this basename: AST:2,ALT:2,ALKPHOS:2,BILITOT:2,PROT:2,ALBUMIN:2 in the last 72 hours No results found for this basename: LIPASE:2,AMYLASE:2 in the last 72 hours  Basename 02/24/12 1310  WBC 14.7*  NEUTROABS 13.2*  HGB 12.8  HCT 37.3  MCV 92.6  PLT 207   No results found for this basename: CKTOTAL:3,CKMB:3,CKMBINDEX:3,TROPONINI:3 in the last 72 hours No components found with this basename: POCBNP:3 No results found for this basename: DDIMER:2 in the last 72 hours No results found for this basename: HGBA1C:2 in the last 72 hours No results found for this basename: CHOL:2,HDL:2,LDLCALC:2,TRIG:2,CHOLHDL:2,LDLDIRECT:2 in the last 72 hours No results found for this basename: TSH,T4TOTAL,FREET3,T3FREE,THYROIDAB in the last 72 hours No results found for this basename: VITAMINB12:2,FOLATE:2,FERRITIN:2,TIBC:2,IRON:2,RETICCTPCT:2 in the last 72 hours  Micro Results: No results found for this or any previous visit (from the  past 240 hour(s)).   Radiological Exams on Admission: Ct Lumbar Spine Wo Contrast  02/24/2012  *RADIOLOGY REPORT*  Clinical Data: Trauma.  Back pain.  Compression fracture.  CT LUMBAR SPINE WITHOUT CONTRAST  Technique:  Multidetector CT imaging of the lumbar spine was performed without intravenous contrast administration. Multiplanar CT image reconstructions were also generated.  Comparison: None.  Findings: Abdominal findings are deferred to CT same day.  There is a T12 compression fracture with mild retropulsion measuring 4 mm. The very mild central stenosis.  Lumbar vertebral body height at other levels appears intact.  No other fractures are identified. This fracture is age indeterminant.  There is no paravertebral hemorrhage to suggest acute or subacute time course.  Loss of height of T12 is 75% anteriorly.  Spinous processes appear intact.  L3-L4 shows circumferential disc bulging.  Central canal appears adequately patent.  L4-L5:  Mild central stenosis associated with broad-based posterior disc bulging.  Facet hypertrophy and ligamentum flavum redundancy contributes to central stenosis.  Foramina are mildly narrowed bilaterally associated with bulging disc.  L5-S1:  Shallow disc bulge without stenosis.  Visualized portions of the sacrum appear within normal limits.  IMPRESSION: 1.  T12 compression fracture with 75% loss of vertebral body height and mild retropulsion.  The fracture is age indeterminant based on lack of paravertebral phlegmon/inflammatory changes is favored to be chronic. 2.  L3-L4 through L5-S1 degenerative disc disease.  Mild central stenosis at L4-L5.   Original Report Authenticated By: Andreas Newport, M.D.    Ct Abdomen Pelvis W Contrast  02/24/2012  *RADIOLOGY REPORT*  Clinical Data: Abdominal and back pain.  CT ABDOMEN AND PELVIS WITH CONTRAST  Technique:  Multidetector CT imaging of the abdomen and pelvis was performed following the standard protocol during bolus administration  of intravenous contrast.  Contrast: OMNIPAQUE IOHEXOL 300 MG/ML  SOLN  Comparison: Plain films of 06/01/2010.  Most recent CT of the 04/10/2006  Findings: Right base scarring with right middle lobe volume loss. Mild cardiomegaly, without pericardial or pleural effusion.Focal steatosis adjacent the falciform ligament.  Hepatomegaly again identified, with the liver measuring 18.7 cm cranial caudal.  Normal spleen, stomach.  Transverse duodenal diverticulum. Partially fatty replaced pancreas. Cholecystectomy without biliary ductal dilatation.  Normal adrenal glands and kidneys for age.  Dense atherosclerosis at the origin the left renal artery. No retroperitoneal or retrocrural adenopathy.  Scattered colonic diverticula.  Normal small bowel without abdominal ascites.    No pelvic adenopathy.    Normal urinary bladder and uterus.  No adnexal mass.  No significant free fluid.  Mild osteopenia. Moderate to mark compression deformity involves the T12 vertebral body, without significant canal compromise.  No surrounding hemorrhage.  IMPRESSION: T12 compression deformity, moderate to severe.  New since 04/10/2006.  Also new since 04/12/2010 MRI. Given absence of surrounding hemorrhage, not felt to be acute.  No other explanation for pain.  Hepatomegaly.   Original Report Authenticated By: Consuello Bossier, M.D.    Dg Chest Portable 1 View  02/24/2012  *RADIOLOGY REPORT*  Clinical Data: Short of breath.  Chest pain.  Back pain.  PORTABLE CHEST - 1 VIEW  Comparison: 08/21/2011.  Findings: Cardiomegaly is present.  Bilateral basilar scarring.  No airspace disease.  No effusion. Monitoring leads are projected over the chest. The patient is rotated to the right.  Osteopenia. Severe right glenohumeral osteoarthritis.  IMPRESSION: Cardiomegaly without failure.  Stable appearance of the chest.   Original Report Authenticated By: Andreas Newport, M.D.     Assessment/Plan Present on Admission:  .COPD exacerbation Chronic  respiratory failure Admit to telemetry Solu-Medrol, nebulizers, antibiotics, Mucinex Continue oxygen to keep sats greater than 88% Back pain Unclear etiology, likely arthritis  Continue when necessary pain medications  .OSTEOPOROSIS-severe  Multiple compression fractures nonacute .HYPERLIPIDEMIA Hypothyroidism Arthritis Hyperglycemia Stable resume home medication, monitor for hyperglycemia on steroids   Full code DVT prophylaxis   Jaziah Goeller 02/24/2012, 9:49 PM

## 2012-02-24 NOTE — ED Notes (Signed)
Pt is here with increased shortness of breath and wheezing.  Pt has COPD and is on Home 02/2L.  Pt is alert and talking

## 2012-02-24 NOTE — ED Notes (Signed)
EKG shown to Dr. Radford Pax who reviewed it and verbalized it looked good.  EKG was not signed off by MD.

## 2012-02-24 NOTE — ED Provider Notes (Signed)
History     CSN: [id number]  Arrival date & time 02/24/12  1302   First MD Initiated Contact with Patient 02/24/12 1356      Chief Complaint  Patient presents with  . Back Pain  . Shortness of Breath   HPI  78 her old female with past history of COPD, hypothyroidism and chronic tobacco use presents with increasing shortness of breath over the last 3 days. Was seen by PCP (Dr. Pete Glatter) who prescribed her azithromycin. She is on 2 L of oxygen at baseline. Denies any chest pain, denies any headache, denies any nausea, denies any diaphoresis, but does endorse some left flank pain. Denies dysuria, frequency or hematuria. Is followed by Dr.Wert (pulmonology). PFTs 09/05/07 FEV1 49% with improvement by 8% ratio 39% and diffusing capacity 58%.    Past Medical History  Diagnosis Date  . Osteoporosis, unspecified     9 Compression Fx with slt cord deformity   . Other abnormal glucose   . Other and unspecified hyperlipidemia   . Esophageal reflux   . Chronic airway obstruction, not elsewhere classified     On home O2 and daily Prednisone since march 2009. PFTs 09/05/07 FEV1 49% with improvement by 8% ratio 39% and diffusing capacity 58%    . Unspecified hypothyroidism   . Tobacco abuse, in remission quit 2002  . Atelectasis of right lung     chronic rt lower lung seen on cT 2010    Past Surgical History  Procedure Date  . Cholecystectomy   . Ankle fracture surgery     left 10/12/2008    Family History  Problem Relation Age of Onset  . Emphysema Brother     multiple  . Emphysema Father     History  Substance Use Topics  . Smoking status: Former Smoker -- 1.5 packs/day for 50 years    Types: Cigarettes    Quit date: 05/14/2000  . Smokeless tobacco: Not on file  . Alcohol Use: No    OB History    Grav Para Term Preterm Abortions TAB SAB Ect Mult Living                  Review of Systems  Constitutional: Negative for fever, chills, activity change and appetite change.    HENT: Negative for ear pain, congestion, rhinorrhea and neck pain.   Eyes: Negative for pain.  Respiratory: Positive for cough and shortness of breath.   Cardiovascular: Negative for chest pain and palpitations.  Gastrointestinal: Negative for nausea, vomiting and abdominal pain.  Genitourinary: Positive for flank pain. Negative for dysuria, urgency, frequency, hematuria, difficulty urinating and pelvic pain.       Left flank pain  Musculoskeletal: Negative for back pain.  Skin: Negative for rash and wound.  Neurological: Negative for weakness and headaches.  Psychiatric/Behavioral: Negative for behavioral problems, confusion and agitation.    Allergies  Levofloxacin; Statins; Sulfonamide derivatives; and Penicillins  Home Medications   Current Outpatient Rx  Name Route Sig Dispense Refill  . ASPIRIN EC 81 MG PO TBEC Oral Take 81 mg by mouth daily.    . ALBUTEROL SULFATE HFA 108 (90 BASE) MCG/ACT IN AERS Inhalation Inhale 2 puffs into the lungs every 4 (four) hours as needed. For shortness of breath    . ALBUTEROL SULFATE (2.5 MG/3ML) 0.083% IN NEBU Nebulization Take 2.5 mg by nebulization every 4 (four) hours as needed. For wheezing    . AZITHROMYCIN 250 MG PO TABS  Take as directed 6 each  0  . BUDESONIDE 0.25 MG/2ML IN SUSP Nebulization Take 2 mLs (0.25 mg total) by nebulization 2 (two) times daily.    Marland Kitchen CALCIUM CITRATE-VITAMIN D 315-200 MG-UNIT PO TABS Oral Take 2 tablets by mouth daily.      Marland Kitchen VITAMIN D3 1000 UNITS PO TABS Oral Take 2,000 Units by mouth 2 (two) times daily.     Marland Kitchen CLOPIDOGREL BISULFATE 75 MG PO TABS Oral Take 75 mg by mouth daily.      Marland Kitchen DIAZEPAM 5 MG PO TABS Oral Take 2.5 mg by mouth 2 (two) times daily as needed.     Marland Kitchen DOXYCYCLINE HYCLATE 100 MG PO TABS Oral Take 1 tablet (100 mg total) by mouth 2 (two) times daily. 20 tablet 0  . FERROUS SULFATE 325 (65 FE) MG PO TBEC Oral Take 325 mg by mouth daily.     . FUROSEMIDE 40 MG PO TABS  Take 1 and 1/2 tabs by mouth  once daily    . GUAIFENESIN ER 600 MG PO TB12  1-2 every 12 hour as needed for cough and congestion 60 tablet 1  . INSULIN ASPART 100 UNIT/ML [place] SOLN  Sliding scale noon meal 5-12 units    . INSULIN LISPRO PROT & LISPRO (75-25) 100 UNIT/ML [place] SUSP  34 units with breakfast, 10 units with dinner    . LEVOTHYROXINE SODIUM 88 MCG PO TABS Oral Take 88 mcg by mouth daily.      Marland Kitchen MAGNESIUM 250 MG PO TABS Oral Take 1 tablet by mouth daily.      Marland Kitchen OMEPRAZOLE 20 MG PO CPDR Oral Take 1 capsule by mouth 2 (two) times daily before a meal.    . POTASSIUM GLUCONATE 595 MG PO CAPS  1 tablet by mouth once daily    . PREDNISONE 10 MG PO TABS  Take 1 tab by mouth once daily or increase to 2 tab until back to baseline when needed for increased shortness of breath 100 tablet 1    Take 4 tablets daily for 4 days, then 3 tablets da ...  . SENNOSIDES 8.6 MG PO TABS Oral Take 2 tablets by mouth 2 (two) times daily.     . SERTRALINE HCL 50 MG PO TABS Oral Take 50 mg by mouth daily.     Marland Kitchen SALINE NASAL SPRAY 0.65 % NA SOLN Nasal Place 2 sprays into the nose 2 (two) times daily as needed for congestion.    Marland Kitchen TIOTROPIUM BROMIDE MONOHYDRATE 18 MCG IN CAPS Inhalation Place 18 mcg into inhaler and inhale daily.      Marland Kitchen VITAMIN C 500 MG PO TABS Oral Take 1,000 mg by mouth daily.        BP 143/77  Pulse 72  Temp 97.9 F (36.6 C) (Oral)  Resp 24  SpO2 98%  Physical Exam  Constitutional: She is oriented to person, place, and time. She appears well-developed and well-nourished. No distress.  HENT:  Head: Normocephalic and atraumatic.  Nose: Nose normal.  Mouth/Throat: Oropharynx is clear and moist.  Eyes: EOM are normal. Pupils are equal, round, and reactive to light.  Neck: Normal range of motion. Neck supple. No tracheal deviation present.  Cardiovascular: Normal rate, regular rhythm, normal heart sounds and intact distal pulses.   Pulmonary/Chest: She has wheezes. She has no rales.       Tachypneic  Abdominal: Soft.  Bowel sounds are normal. She exhibits no distension. There is no tenderness. There is no rebound and no guarding.  Musculoskeletal:  Normal range of motion. She exhibits no tenderness.  Neurological: She is alert and oriented to person, place, and time.  Skin: Skin is warm and dry. No rash noted.  Psychiatric: She has a normal mood and affect. Her behavior is normal.    ED Course  Procedures (including critical care time)  Results for orders placed during the hospital encounter of 02/24/12  CBC WITH DIFFERENTIAL      Component Value Range   WBC 14.7 (*) 4.0 - 10.5 K/uL   RBC 4.03  3.87 - 5.11 MIL/uL   Hemoglobin 12.8  12.0 - 15.0 g/dL   HCT 16.1  09.6 - 04.5 %   MCV 92.6  78.0 - 100.0 fL   MCH 31.8  26.0 - 34.0 pg   MCHC 34.3  30.0 - 36.0 g/dL   RDW 40.9  81.1 - 91.4 %   Platelets 207  150 - 400 K/uL   Neutrophils Relative 90 (*) 43 - 77 %   Neutro Abs 13.2 (*) 1.7 - 7.7 K/uL   Lymphocytes Relative 6 (*) 12 - 46 %   Lymphs Abs 0.9  0.7 - 4.0 K/uL   Monocytes Relative 4  3 - 12 %   Monocytes Absolute 0.5  0.1 - 1.0 K/uL   Eosinophils Relative 0  0 - 5 %   Eosinophils Absolute 0.0  0.0 - 0.7 K/uL   Basophils Relative 0  0 - 1 %   Basophils Absolute 0.0  0.0 - 0.1 K/uL  BASIC METABOLIC PANEL      Component Value Range   Sodium 135  135 - 145 mEq/L   Potassium 4.0  3.5 - 5.1 mEq/L   Chloride 95 (*) 96 - 112 mEq/L   CO2 29  19 - 32 mEq/L   Glucose, Bld 284 (*) 70 - 99 mg/dL   BUN 20  6 - 23 mg/dL   Creatinine, Ser 7.82  0.50 - 1.10 mg/dL   Calcium 9.4  8.4 - 95.6 mg/dL   GFR calc non Af Amer 73 (*) >90 mL/min   GFR calc Af Amer 85 (*) >90 mL/min  URINALYSIS, ROUTINE W REFLEX MICROSCOPIC      Component Value Range   Color, Urine YELLOW  YELLOW   APPearance CLEAR  CLEAR   Specific Gravity, Urine 1.006  1.005 - 1.030   pH 5.0  5.0 - 8.0   Glucose, UA NEGATIVE  NEGATIVE mg/dL   Hgb urine dipstick NEGATIVE  NEGATIVE   Bilirubin Urine NEGATIVE  NEGATIVE   Ketones, ur NEGATIVE   NEGATIVE mg/dL   Protein, ur NEGATIVE  NEGATIVE mg/dL   Urobilinogen, UA 0.2  0.0 - 1.0 mg/dL   Nitrite NEGATIVE  NEGATIVE   Leukocytes, UA NEGATIVE  NEGATIVE  LACTIC ACID, PLASMA      Component Value Range   Lactic Acid, Venous 3.3 (*) 0.5 - 2.2 mmol/L  POCT I-STAT TROPONIN I      Component Value Range   Troponin i, poc 0.01  0.00 - 0.08 ng/mL   Comment 3           GLUCOSE, CAPILLARY      Component Value Range   Glucose-Capillary 340 (*) 70 - 99 mg/dL        CT Lumbar Spine Wo Contrast (Final result)   Result time:02/24/12 2043    Final result by Rad Results In Interface (02/24/12 20:43:43)    Narrative:   *RADIOLOGY REPORT*  Clinical Data: Trauma. Back pain. Compression fracture.  CT LUMBAR SPINE WITHOUT CONTRAST  Technique: Multidetector CT imaging of the lumbar spine was performed without intravenous contrast administration. Multiplanar CT image reconstructions were also generated.  Comparison: None.  Findings: Abdominal findings are deferred to CT same day. There is a T12 compression fracture with mild retropulsion measuring 4 mm. The very mild central stenosis. Lumbar vertebral body height at other levels appears intact. No other fractures are identified. This fracture is age indeterminant. There is no paravertebral hemorrhage to suggest acute or subacute time course. Loss of height of T12 is 75% anteriorly. Spinous processes appear intact.  L3-L4 shows circumferential disc bulging. Central canal appears adequately patent.  L4-L5: Mild central stenosis associated with broad-based posterior disc bulging. Facet hypertrophy and ligamentum flavum redundancy contributes to central stenosis. Foramina are mildly narrowed bilaterally associated with bulging disc.  L5-S1: Shallow disc bulge without stenosis.  Visualized portions of the sacrum appear within normal limits.  IMPRESSION: 1. T12 compression fracture with 75% loss of vertebral body height and mild  retropulsion. The fracture is age indeterminant based on lack of paravertebral phlegmon/inflammatory changes is favored to be chronic. 2. L3-L4 through L5-S1 degenerative disc disease. Mild central stenosis at L4-L5.   Original Report Authenticated By: Andreas Newport, M.D.             CT Abdomen Pelvis W Contrast (Final result)   Result time:02/24/12 2031    Final result by Rad Results In Interface (02/24/12 20:31:12)    Narrative:   *RADIOLOGY REPORT*  Clinical Data: Abdominal and back pain.  CT ABDOMEN AND PELVIS WITH CONTRAST  Technique: Multidetector CT imaging of the abdomen and pelvis was performed following the standard protocol during bolus administration of intravenous contrast.  Contrast: OMNIPAQUE IOHEXOL 300 MG/ML SOLN  Comparison: Plain films of 06/01/2010. Most recent CT of the 04/10/2006  Findings: Right base scarring with right middle lobe volume loss. Mild cardiomegaly, without pericardial or pleural effusion.Focal steatosis adjacent the falciform ligament. Hepatomegaly again identified, with the liver measuring 18.7 cm cranial caudal.  Normal spleen, stomach. Transverse duodenal diverticulum. Partially fatty replaced pancreas. Cholecystectomy without biliary ductal dilatation. Normal adrenal glands and kidneys for age.  Dense atherosclerosis at the origin the left renal artery. No retroperitoneal or retrocrural adenopathy.  Scattered colonic diverticula. Normal small bowel without abdominal ascites.  No pelvic adenopathy. Normal urinary bladder and uterus. No adnexal mass. No significant free fluid. Mild osteopenia. Moderate to mark compression deformity involves the T12 vertebral body, without significant canal compromise. No surrounding hemorrhage.  IMPRESSION: T12 compression deformity, moderate to severe. New since 04/10/2006. Also new since 04/12/2010 MRI. Given absence of surrounding hemorrhage, not felt to be acute.  No other  explanation for pain.  Hepatomegaly.   Original Report Authenticated By: Consuello Bossier, M.D.             DG Chest Portable 1 View (Final result)   Result time:02/24/12 1458    Final result by Rad Results In Interface (02/24/12 14:58:32)    Narrative:   *RADIOLOGY REPORT*  Clinical Data: Short of breath. Chest pain. Back pain.  PORTABLE CHEST - 1 VIEW  Comparison: 08/21/2011.  Findings: Cardiomegaly is present. Bilateral basilar scarring. No airspace disease. No effusion. Monitoring leads are projected over the chest. The patient is rotated to the right. Osteopenia. Severe right glenohumeral osteoarthritis.  IMPRESSION: Cardiomegaly without failure. Stable appearance of the chest.   Original Report Authenticated By: Andreas Newport, M.D.           1.  COPD exacerbation       MDM   78 year old female tachypneic, afebrile, satting 98% on 5 L nasal cannula. Baseline 2 L oxygen requirement at home. DuoNeb and Solu-Medrol administered in the emergency department. Chest x-ray without consolidation patient afebrile doubt pneumonia. Patient on third day of azithromycin. During her ED  course patient complained of low back pain CT abdomen and pelvis were ordered. For her T12 compression deformity with no evidence of acute. No point tenderness on my exam. Chest x-ray no evidence of consolidation doubt pneumonia. Case discussed with Dr. Haroldine Laws patient admitted for further management and care of COPD exacerbation.        Nadara Mustard, MD 02/25/12 [phone]

## 2012-02-24 NOTE — ED Notes (Signed)
Son stated, she is having back pain and some wheezing.  They started her on a Z-Pak on Friday for the wheezing , Dr. Pete Glatter.  Wheezing started this morning after her breathing treatment this morning

## 2012-02-25 DIAGNOSIS — J441 Chronic obstructive pulmonary disease with (acute) exacerbation: Secondary | ICD-10-CM

## 2012-02-25 DIAGNOSIS — J9610 Chronic respiratory failure, unspecified whether with hypoxia or hypercapnia: Secondary | ICD-10-CM

## 2012-02-25 DIAGNOSIS — J449 Chronic obstructive pulmonary disease, unspecified: Secondary | ICD-10-CM

## 2012-02-25 LAB — BASIC METABOLIC PANEL
BUN: 21 mg/dL (ref 6–23)
GFR calc Af Amer: 89 mL/min — ABNORMAL LOW (ref >90)
GFR calc non Af Amer: 77 mL/min — ABNORMAL LOW (ref >90)
Potassium: 4.4 mEq/L (ref 3.5–5.1)
Sodium: 136 mEq/L (ref 135–145)

## 2012-02-25 LAB — BLOOD GAS, ARTERIAL
Drawn by: 31297
O2 Content: 2 L/min
O2 Saturation: 97.3 %
Patient temperature: 98.6
pO2, Arterial: 87.5 mmHg (ref 80.0–100.0)

## 2012-02-25 LAB — GLUCOSE, CAPILLARY
Glucose-Capillary: 342 mg/dL — ABNORMAL HIGH (ref 70–99)
Glucose-Capillary: 205 mg/dL — ABNORMAL HIGH (ref 70–99)
Glucose-Capillary: 145 mg/dL — ABNORMAL HIGH (ref 70–99)
Glucose-Capillary: 207 mg/dL — ABNORMAL HIGH (ref 70–99)

## 2012-02-25 LAB — CBC
Hemoglobin: 13.1 g/dL (ref 12.0–15.0)
MCHC: 34.1 g/dL (ref 30.0–36.0)
Platelets: 218 10*3/uL (ref 150–400)
RDW: 12.9 % (ref 11.5–15.5)

## 2012-02-25 MED ORDER — IPRATROPIUM BROMIDE 0.02 % IN SOLN
0.5000 mg | RESPIRATORY_TRACT | Status: DC
  Administered 2012-02-25 – 2012-02-27 (×11): 0.5 mg via RESPIRATORY_TRACT
  Filled 2012-02-25 (×10): qty 2.5

## 2012-02-25 MED ORDER — CLONIDINE HCL 0.1 MG PO TABS
0.1000 mg | ORAL_TABLET | Freq: Four times a day (QID) | ORAL | Status: DC | PRN
  Administered 2012-02-25 (×2): 0.1 mg via ORAL
  Filled 2012-02-25 (×2): qty 1

## 2012-02-25 MED ORDER — ALBUTEROL SULFATE (5 MG/ML) 0.5% IN NEBU
2.5000 mg | INHALATION_SOLUTION | RESPIRATORY_TRACT | Status: DC | PRN
  Administered 2012-02-28: 2.5 mg via RESPIRATORY_TRACT
  Filled 2012-02-25: qty 0.5

## 2012-02-25 MED ORDER — ALBUTEROL SULFATE (5 MG/ML) 0.5% IN NEBU
2.5000 mg | INHALATION_SOLUTION | RESPIRATORY_TRACT | Status: DC
  Administered 2012-02-25 – 2012-02-28 (×16): 2.5 mg via RESPIRATORY_TRACT
  Filled 2012-02-25 (×17): qty 0.5

## 2012-02-25 NOTE — ED Provider Notes (Signed)
I was present consultation and examined the patient in question during their ED stay and agree with the resident's documentation and resident's medical plan.  Jones Skene, MD   Jones Skene, MD 02/25/12 5621

## 2012-02-25 NOTE — Progress Notes (Addendum)
[facility] PROGRESS NOTE  [name] UJW:[id number] DOB: 1923/11/13 DOA: 02/24/2012 PCP: No primary provider on file.  Assessment/Plan:  Acute on chronic COPD exacerbation with acute respiratory failure with hypoxia and moderate to severe increased work of breathing with SCM, suprasternal, supraclavicular, subcostal retractions.   -  ABG -  Patient has allergy to sulfa drugs -  Duonebs q4h with albuterol q2h -  Continue solumedrol -  Add symbicort (home medication) -  Continue antibiotics (ceftriaxone and azithromycin) day 1 -  Continue mucinex  Diabetes, stable.  Will trend fingersticks -  Continue 70/30:  34qAM and 10qPM -  SSI  At risk for CAD -  Continue aspirin and plavix  Hypothyroidism, stable:  Continue synthroid  Mood d/o:  Stable.  Continue zoloft, valium GERD:  Stable, contine protonix.    Code Status: Full code Family Communication:  Spoke with patient and son Disposition Plan:  Pending patient able to complete ADLs without moderate to severe respiratory distress.  To home on home O2.     Consultants:  None  Procedures:  None  Antibiotics:  Ceftriaxone 10-13 at 2300  Azithromcyin 10-13 at 2300   HPI/Subjective:  Patient continues to feel very [place]uss[place] of breath.  Was able to sit at bedside and eat some breakfast this morning very slowly.    Objective: Filed Vitals:   02/25/12 0500 02/25/12 0508 02/25/12 0655 02/25/12 0810  BP: 188/79  162/73   Pulse: 78     Temp: 97.6 F (36.4 C)     TempSrc: Oral     Resp: 22     Height:      Weight:      SpO2: 96% 96%  98%    Intake/Output Summary (Last 24 hours) at 02/25/12 0830 Last data filed at 02/24/12 2358  Gross per 24 hour  Intake    420 ml  Output      0 ml  Net    420 ml   Filed Weights   02/24/12 2300  Weight: 68.493 kg (151 lb)    Exam:   General:   CF, moderate to severe increased work of breathing with SCM, suprasternal, supraclavicular, subcostal  retractions  HEENT:  MMM  Cardiovascular:  RRR, S1, S2, difficult to hear  Respiratory:  Very diminished at the bases, full very faint and high pitched expiratory wheeze with I:E of 1:8 (patient had to take breath before fully exhaled)  Abdomen: NABS, distended, firm, nontender  MSK:  No LEE    Data Reviewed: Basic Metabolic Panel:  Lab 02/25/12 5621 02/24/12 1310  NA 136 135  K 4.4 4.0  CL 95* 95*  CO2 28 29  GLUCOSE 311* 284*  BUN 21 20  CREATININE 0.68 0.79  CALCIUM 9.1 9.4  MG -- --  PHOS -- --   Liver Function Tests: No results found for this basename: AST:5,ALT:5,ALKPHOS:5,BILITOT:5,PROT:5,ALBUMIN:5 in the last 168 hours No results found for this basename: LIPASE:5,AMYLASE:5 in the last 168 hours No results found for this basename: AMMONIA:5 in the last 168 hours CBC:  Lab 02/25/12 0515 02/24/12 1310  WBC 8.4 14.7*  NEUTROABS -- 13.2*  HGB 13.1 12.8  HCT 38.4 37.3  MCV 91.2 92.6  PLT 218 207   Cardiac Enzymes: No results found for this basename: CKTOTAL:5,CKMB:5,CKMBINDEX:5,TROPONINI:5 in the last 168 hours BNP (last 3 results)  Basename 10/03/11 1248 05/17/11 0515 05/15/11 0530  PROBNP 60.0 1196.0* 1101.0*   CBG:  Lab 02/25/12 0731 02/24/12 2259  GLUCAP 342* 340*  No results found for this or any previous visit (from the past 240 hour(s)).   Studies: Ct Lumbar Spine Wo Contrast  02/24/2012  *RADIOLOGY REPORT*  Clinical Data: Trauma.  Back pain.  Compression fracture.  CT LUMBAR SPINE WITHOUT CONTRAST  Technique:  Multidetector CT imaging of the lumbar spine was performed without intravenous contrast administration. Multiplanar CT image reconstructions were also generated.  Comparison: None.  Findings: Abdominal findings are deferred to CT same day.  There is a T12 compression fracture with mild retropulsion measuring 4 mm. The very mild central stenosis.  Lumbar vertebral body height at other levels appears intact.  No other fractures are  identified. This fracture is age indeterminant.  There is no paravertebral hemorrhage to suggest acute or subacute time course.  Loss of height of T12 is 75% anteriorly.  Spinous processes appear intact.  L3-L4 shows circumferential disc bulging.  Central canal appears adequately patent.  L4-L5:  Mild central stenosis associated with broad-based posterior disc bulging.  Facet hypertrophy and ligamentum flavum redundancy contributes to central stenosis.  Foramina are mildly narrowed bilaterally associated with bulging disc.  L5-S1:  Shallow disc bulge without stenosis.  Visualized portions of the sacrum appear within normal limits.  IMPRESSION: 1.  T12 compression fracture with 75% loss of vertebral body height and mild retropulsion.  The fracture is age indeterminant based on lack of paravertebral phlegmon/inflammatory changes is favored to be chronic. 2.  L3-L4 through L5-S1 degenerative disc disease.  Mild central stenosis at L4-L5.   Original Report Authenticated By: Andreas Newport, M.D.    Ct Abdomen Pelvis W Contrast  02/24/2012  *RADIOLOGY REPORT*  Clinical Data: Abdominal and back pain.  CT ABDOMEN AND PELVIS WITH CONTRAST  Technique:  Multidetector CT imaging of the abdomen and pelvis was performed following the standard protocol during bolus administration of intravenous contrast.  Contrast: OMNIPAQUE IOHEXOL 300 MG/ML  SOLN  Comparison: Plain films of 06/01/2010.  Most recent CT of the 04/10/2006  Findings: Right base scarring with right middle lobe volume loss. Mild cardiomegaly, without pericardial or pleural effusion.Focal steatosis adjacent the falciform ligament.  Hepatomegaly again identified, with the liver measuring 18.7 cm cranial caudal.  Normal spleen, stomach.  Transverse duodenal diverticulum. Partially fatty replaced pancreas. Cholecystectomy without biliary ductal dilatation.  Normal adrenal glands and kidneys for age.  Dense atherosclerosis at the origin the left renal artery. No  retroperitoneal or retrocrural adenopathy.  Scattered colonic diverticula.  Normal small bowel without abdominal ascites.    No pelvic adenopathy.    Normal urinary bladder and uterus.  No adnexal mass.  No significant free fluid.  Mild osteopenia. Moderate to mark compression deformity involves the T12 vertebral body, without significant canal compromise.  No surrounding hemorrhage.  IMPRESSION: T12 compression deformity, moderate to severe.  New since 04/10/2006.  Also new since 04/12/2010 MRI. Given absence of surrounding hemorrhage, not felt to be acute.  No other explanation for pain.  Hepatomegaly.   Original Report Authenticated By: Consuello Bossier, M.D.    Dg Chest Portable 1 View  02/24/2012  *RADIOLOGY REPORT*  Clinical Data: [place] of breath.  Chest pain.  Back pain.  PORTABLE CHEST - 1 VIEW  Comparison: 08/21/2011.  Findings: Cardiomegaly is present.  Bilateral basilar scarring.  No airspace disease.  No effusion. Monitoring leads are projected over the chest. The patient is rotated to the right.  Osteopenia. Severe right glenohumeral osteoarthritis.  IMPRESSION: Cardiomegaly without failure.  Stable appearance of the chest.   Original  Report Authenticated By: Andreas Newport, M.D.     Scheduled Meds:   . albuterol  2.5 mg Nebulization Once  . ipratropium  0.5 mg Nebulization Q4H   And  . albuterol  2.5 mg Nebulization Q4H  . albuterol      . albuterol      . aspirin EC  81 mg Oral Daily  . azithromycin  500 mg Intravenous Q24H  . cefTRIAXone (ROCEPHIN)  IV  1 g Intravenous Q24H  . clopidogrel  75 mg Oral Q breakfast  . diazepam  2 mg Intravenous Once  . enoxaparin (LOVENOX) injection  30 mg Subcutaneous Q24H  . guaiFENesin  600 mg Oral BID  . insulin aspart  0-15 Units Subcutaneous TID WC  . insulin aspart  0-5 Units Subcutaneous QHS  . insulin aspart protamine-insulin aspart  10 Units Subcutaneous Q supper  . insulin aspart protamine-insulin aspart  34 Units Subcutaneous Q  breakfast  . iohexol  20 mL Oral Q1 Hr x 2  . ipratropium      . ipratropium      . ipratropium  0.5 mg Nebulization Once  . levothyroxine  88 mcg Oral Q0600  . magnesium oxide  400 mg Oral Daily  . methylPREDNISolone (SOLU-MEDROL) injection  125 mg Intravenous Once  . methylPREDNISolone (SOLU-MEDROL) injection  40 mg Intravenous Q12H  .  morphine injection  4 mg Intravenous Once  . pantoprazole  40 mg Oral Q1200  . Racepinephrine HCl  0.5 mL Nebulization Once  . senna  2 tablet Oral BID  . sertraline  50 mg Oral Daily  . sodium chloride  3 mL Intravenous Q12H   Continuous Infusions:   Active Problems:  HYPOTHYROIDISM  HYPERLIPIDEMIA  OSTEOPOROSIS  COPD exacerbation  Chronic respiratory failure  Diabetes mellitus    Time spent: 30    Ilai Hiller, [facility]  [facility] Pager [phone]. If 8PM-8AM, please contact night-coverage at [email or web address], password [facility] 02/25/2012, 8:30 AM  LOS: 1 day

## 2012-02-26 LAB — GLUCOSE, CAPILLARY
Glucose-Capillary: 341 mg/dL — ABNORMAL HIGH (ref 70–99)
Glucose-Capillary: 226 mg/dL — ABNORMAL HIGH (ref 70–99)
Glucose-Capillary: 164 mg/dL — ABNORMAL HIGH (ref 70–99)
Glucose-Capillary: 246 mg/dL — ABNORMAL HIGH (ref 70–99)

## 2012-02-26 LAB — BASIC METABOLIC PANEL
BUN: 29 mg/dL — ABNORMAL HIGH (ref 6–23)
Creatinine, Ser: 0.71 mg/dL (ref 0.50–1.10)
GFR calc Af Amer: 88 mL/min — ABNORMAL LOW (ref >90)
GFR calc non Af Amer: 76 mL/min — ABNORMAL LOW (ref >90)
Glucose, Bld: 350 mg/dL — ABNORMAL HIGH (ref 70–99)

## 2012-02-26 LAB — CBC
HCT: 36.9 % (ref 36.0–46.0)
MCH: 31.3 pg (ref 26.0–34.0)
MCHC: 33.9 g/dL (ref 30.0–36.0)
MCV: 92.3 fL (ref 78.0–100.0)
RDW: 12.7 % (ref 11.5–15.5)

## 2012-02-26 MED ORDER — ENOXAPARIN SODIUM 40 MG/0.4ML ~~LOC~~ SOLN
40.0000 mg | SUBCUTANEOUS | Status: DC
  Administered 2012-02-27 – 2012-02-28 (×2): 40 mg via SUBCUTANEOUS
  Filled 2012-02-26 (×3): qty 0.4

## 2012-02-26 MED ORDER — INSULIN ASPART PROT & ASPART (70-30 MIX) 100 UNIT/ML ~~LOC~~ SUSP
20.0000 [IU] | Freq: Every day | SUBCUTANEOUS | Status: DC
  Administered 2012-02-26 – 2012-02-27 (×2): 20 [IU] via SUBCUTANEOUS
  Filled 2012-02-26: qty 3

## 2012-02-26 MED ORDER — METHYLPREDNISOLONE SODIUM SUCC 40 MG IJ SOLR
40.0000 mg | Freq: Four times a day (QID) | INTRAMUSCULAR | Status: DC
  Administered 2012-02-26 – 2012-02-27 (×4): 40 mg via INTRAVENOUS
  Filled 2012-02-26 (×7): qty 1

## 2012-02-26 MED ORDER — FLEET ENEMA 7-19 GM/118ML RE ENEM
1.0000 | ENEMA | Freq: Every day | RECTAL | Status: DC | PRN
  Filled 2012-02-26: qty 1

## 2012-02-26 MED ORDER — METOPROLOL TARTRATE 12.5 MG HALF TABLET
12.5000 mg | ORAL_TABLET | Freq: Two times a day (BID) | ORAL | Status: DC
  Administered 2012-02-26: 12.5 mg via ORAL
  Filled 2012-02-26 (×2): qty 1

## 2012-02-26 NOTE — Progress Notes (Signed)
Subjective:    Patient ID: [name], female    DOB: 07-17-1923, 78 y.o.   MRN: [id number]  HPI 60 yowf quit smoking 2002 with onset doe and a mild chronic cough. She subsequently gained 15 pounds and beginning about the middle of 2006 began to have exacerbations with coughing wheezing and shortness of breath which required short course of prednisone to get her back to baseline but requiring Prednisone daily since 3/09.   December 22, 2007 ov no decrease in ex tol albeit on oxygen at 24 hours a day and maintaining prednisone at 10 mg daily with gradual improvement in dyspnea.   March 16, 2010  c/o sharp pain substernally radiating back along lower rib cage, c/o dyspnea , no wheeze, no signs of cold - Not taking spiriva  Steroid dependent, takes pulmicort & duonebs  Told at [facility] that she has clogged arteries causing abdominal pain  EKG - nSR with RBBB& left bifascicular block - q waves in V1-2 related to BBB ? vs old MI   April 03, 2010 --Presents for follow up. Had fall 3 weeks ago. She lost her balance in closet 10/29 and fell forward. Had bilateral sharp rib pain w/ radiation into back. CXR w/ no acute changes. Seen at urgent care 2 weeks go, tx w/ advil. She is no better. Has sudden sharp pain in ribs bialterally w/ pain into back-catching pain w/ sudden movement. Denies chest pain, dyspnea, orthopnea, hemoptysis, fever, n/v/d, edema, headache. Her to breathe in at times. dx T 9 fx > Deveshwar eval, no rx.   April 20, 2010 ov cc sob x 3 weeks, sob at rest, no unusual cough/ congestion, does ok sleeping . Increased pred back to 20 mg per day with no benefit. no purulent sputum. back better, no leg c/ow just gen fatigue. Pt denies any significant sore throat, nasal congestion or excess secretions, fever, chills, sweats, unintended wt loss, pleuritic or exertional cp, orthopnea pnd or leg swelling.   Jan 2012 Hosp much better and 10 days of avelox >  Then cipro and able prednisone     08/22/10 Mid march prednisone up to 40 and another round of cipro >  Looser less yellow and pred down to 10 mg per day.  Confused with meds/ instructions and maint vs prn.   >>rx perforomoist and spiriva   09/19/10 Acute OV Complains of prod cough with yellow mucus (clearing), wheezing - states feels "about the same" even with doxy rx given 5.4.12.  daughter reports increased pred to 20mg  on 5.4.12. She is some better and mucus is mostly clear.Marland Kitchen She was at beach last week when cough got worse. Cant stop coughing at times. Worse at night. >>rec to finish doxyc, and steroid bump to 40mg  w/ taper to hold at 20mg    10/04/10 Follow up  2 week follow up. Last ov w/ AECOPD. She was tx with doxyc and steroid bump at 40mg  . She states wheezing and cough have improved, still having clear mucus production and weakness. Feels a lot better. Continues to have no energy.  No discolored mucus or fever. No increased swelling.  She is currently at 20mg  of prednsione daily   11/21/2010 Acute OV  Pt presents for an acute office visit. Complains of increased SOB, wheezing, prod cough with yellow/brown mucus x 4 days.  Pt is here with her daughter today. 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 She was having significant abdominal pain w/  n/v and weight loss over last several weeks -she is feeling so much better w/ improved appetite and resolution of abdominal pain. She started with cough and congestion after discharge. No fever or chest pain. No increased edema .  SHe is currently on prednisone 20mg  daily . She is now on plavix .Denies hemoptysis. >>Levaquin /steroid taper.   8/16//2012 Follow up  Returns for 1 month follow up . She has AECOPD last ov , tx w/ Levaquin and steroid taper . Says she is feeling better back to her baseline. Currently on pred 10mg  daily . No flare in cough or dyspnea. No leg swelling . No fever.  Says she is off Spiriva- messed with her eyes in past.   >>no changes   02/21/2011 Acute OV  Complains of cough , congestion , wheezing for 2 weeks. Took Cipro 7 days and steroid burst (  left over from daughter rx ). She got better but then cough returned 3 days ago, restarted Cipro 500mg  . Still has some cough , but much better. Currently on prednisone 10mg  daily . Better but not totally over symptoms. Daughter Alona Bene) is leaving to go out of town and would like her to be checked. No hemoptysis or edema  >>  05/28/2011 [facility]  Admitted December 27 2 05/18/2011 for acute COPD exacerbation, urinary tract infection, influenza, acute on chronic respiratory failure. The patient presented initially with acute confusion, hypoxia, and weakness, with fever of 103.5. She was started on Tamiflu. IV antibiotics and IV steroids. She was treated with nebulizer therapy. She was discharged on Keflex, which she has now completed. Urine culture showed Escherichia coli and Klebsiella UTI Since discharge. Patient continues to feel somewhat weak. However, her breathing has improved with decreased cough and congestion. She is currently on a prednisone taper at 30 mg and will be changing over to 20 mg tomorrow. Her daughter is with her today and is concerned that she continues to have a urinary tract infection. Because of complaints of intermittent dysuria and urgency. She denies any fever, chest pain, hemoptysis, or leg swelling.   08/21/2011 Acute OV / NP Patient presents for possible pneumonia. Pt c/o cough with thick green/yellow mucus, sob, wheezing, and weakness x 10 days Also night sweats.  Denies fever, chest pain and chest tightness. Took Doxycycline x  7 days by PCP , finished last dose yesterday  Not helping. No fever or leg swelling Good appetite. No urinary symptoms. Currently on prednisone 20mg  daily  rec Avelox 400mg  daily for 7 days -take with food-sample given  Mucinex DM Twice daily  As needed  Cough/congesiton  Continue on Prednisone 20mg  daily for  now   09/04/2011 f/u ov/Wert cc mucus still a bit yellow p 8/10 cipro with still thick mucus but breathing better and less need for daytime saba rescue.  N >>add perforomist   10/03/11 Follow up  4 week follow up COPD.  Over last 2 weeks less energy, has some wheezing.  More swelling in legs , weight is trending up .  No increased cough or discolored mucus. No fever.  Last ov perforomist added but she is unable to tolerate.  Has only been taking lasix 1 daily  >>lasix increased x 2 days then back to 60mg    BNP nml     11/27/2011 Follow up  Returns for 2 month follow up for COPD  had increased SOB "episode" 3 weeks ago, and tapered prednisone > better Back to her baseline now , on prednisone  10mg  daily  No fever or chest pain .  No increased leg swelling . No orthopnea   Very sedentary at home .  >no changes   02/19/12 Follow up  Pt returns with daughter for follow up for COPD  Dyspnea is at baseline Has good and bad days.  Has some  wheezing, some increased SOB, occ dry cough on/off.  No fever or edema.  Daughter is leaving to visit family for couple of weeks  No hemoptysis or chest pain.          ROS:  Constitutional:   No  weight loss, night sweats,  Fevers, chills,  +fatigue, or  lassitude.  HEENT:   No headaches,  Difficulty swallowing,  Tooth/dental problems, or  Sore throat,                No sneezing, itching, ear ache, nasal congestion, post nasal drip,   CV:  No chest pain,  Orthopnea, PND,   anasarca, dizziness, palpitations, syncope.   GI  No heartburn, indigestion, abdominal pain, nausea, vomiting, diarrhea, change in bowel habits, loss of appetite, bloody stools.   Resp:    No coughing up of blood.   No chest wall deformity  Skin: no rash or lesions.  GU: no dysuria, change in color of urine, no urgency or frequency.  No flank pain, no hematuria   MS:  No joint pain or swelling.  No decreased range of motion.  No back pain.  Psych:  No change in mood  or affect. No depression or anxiety.  No memory loss.                   Objective:   Physical Exam  GEN: A/Ox3; pleasant , NAD, frail and elderly   HEENT:  /AT,  EACs-clear, TMs-wnl, NOSE-clear, THROAT-clear, no lesions, no postnasal drip or exudate noted.   NECK:  Supple w/ fair ROM; no JVD; normal carotid impulses w/o bruits; no thyromegaly or nodules palpated; no lymphadenopathy.  RESP  Coarse BS w/ no wheezing   no accessory muscle use, no dullness to percussion  CARD:  RRR, no m/r/g  ,no  peripheral edema, pulses intact, no cyanosis or clubbing.  GI:   Soft & nt; nml bowel sounds; no organomegaly or masses detected.   Musco: Warm bil, no deformities or joint swelling noted.   Neuro: alert, no focal deficits noted.    Skin: Warm, no lesions or rashes      08/21/11  COPD/chronic changes. Cardiomegaly.  No acute findings or change.    Assessment & Plan:

## 2012-02-26 NOTE — Assessment & Plan Note (Signed)
Compensated on present regimen follow up in 2 months and As needed

## 2012-02-26 NOTE — Care Management Note (Addendum)
    Page 1 of 2   02/28/2012     11:44:11 AM   CARE MANAGEMENT NOTE 02/28/2012  Patient:  [name], [name]   Account Number:  [id number]  Date Initiated:  02/26/2012  Documentation initiated by:  GRAVES-BIGELOW,Pau Banh  Subjective/Objective Assessment:   Pt admitted with SOB. Pt is from home alone and is dependent home 02.     Action/Plan:   CM will continue to monitor for disposition needs. Pt was asleep at time of visit. CM did speak to RN and she will see if pt needs PT/OT. MD please write order for PT if you feel appropriate. Pt will benefit a HHRN for disease management.   Anticipated DC Date:  02/28/2012   Anticipated DC Plan:  HOME W HOME HEALTH SERVICES      DC Planning Services  CM consult      [facility] Choice  HOME HEALTH   Choice offered to / List presented to:  C-4 Adult Children        HH arranged  HH-10 DISEASE MANAGEMENT  HH-1 RN  HH-2 PT      [facility] agency  [facility]   Status of service:  Completed, signed off Medicare Important Message given?   (If response is "NO", the following Medicare IM given date fields will be blank) Date Medicare IM given:   Date Additional Medicare IM given:    Discharge Disposition:  SKILLED NURSING FACILITY  Per UR Regulation:  Reviewed for med. necessity/level of care/duration of stay  If discussed at Long Length of Stay Meetings, dates discussed:    Comments:  02-28-12 [address] [place], [place] [phone] Cm did speak to pt this am and she feels that she may benefit more form SNF. Pt is from home with husband and she feels and little rehab will be good until her daughter comes into town from vacation. CSW is working with disposition needs and Plan is for d/c today.    02-27-12 [address] [place] [phone] Pt plan for d/c today. CM spoke to pt's son [name] ( [phone]). Pt lives with husband who still drives and she has family support. Pt has dme 02, rw, 3n1, tub bench,  toilet seat riser and cane at home. Son is willing to take pt home if d/c today. CM made referral with [facility] services for [facility] needs listed above. CM did ask MD to write orders for services. No further needs from CM at this time.

## 2012-02-26 NOTE — Progress Notes (Addendum)
[facility] PROGRESS NOTE  [name] ZOX:[id number] DOB: 07-07-1923 DOA: 02/24/2012 PCP: No primary provider on file.  Assessment/Plan:  Acute on chronic COPD exacerbation with persistent acute respiratory failure with hypoxia and moderate to severe increased work of breathing with SCM, suprasternal, supraclavicular, subcostal retractions.  Initially was looking and feeling slightly better this morning, but then was feeling and looking worse this afternoon.   -  Duonebs q4h with albuterol q2h -  Increase solumedrol -  Add symbicort (home medication) -  Continue antibiotics (ceftriaxone and azithromycin) day 2 -  Continue mucinex  Diabetes, stable.  Fingersticks normal to mildly elevated during day, but elevated particularly in the morning -  Increase PM 70/30 to 20 units -  Continue 70/30:  34qAM  -  SSI  At risk for CAD -  Continue aspirin and plavix  Elevated blood pressures without history of hypertension with sinus arrhythmia and NSVT of 3-4 beats -  Initiated selective BB this morning as trial while patient hospitalized, but she had increase WOB so D/Ced -  Continue to monitor patient in hospital for rhythm and blood pressure control    Hypothyroidism, stable:  Continue synthroid  Mood d/o:  Stable.  Continue zoloft, valium GERD:  Stable, contine protonix.    Code Status: Full code Family Communication:  Spoke with patient Disposition Plan:  Pending patient able to complete ADLs without moderate to severe respiratory distress.  To home on home O2.     Consultants:  None  Procedures:  None  Antibiotics:  Ceftriaxone 10-13 at 2300  Azithromcyin 10-13 at 2300   HPI/Subjective:  AM:  Feeling better PM:  Feeling more [name] of breath, but denies needing bipap at this time.  Patient states she will rest and will notify MD if she feels like she is having more work of breathing or feeling tired of breathing.      Objective: Filed Vitals:   02/25/12  2333 02/26/12 0500 02/26/12 0633 02/26/12 0807  BP:  160/69  156/68  Pulse:  70    Temp:  97.8 F (36.6 C)    TempSrc:  Oral    Resp:  20    Height:      Weight:      SpO2: 100% 98% 98%     Intake/Output Summary (Last 24 hours) at 02/26/12 0846 Last data filed at 02/25/12 2334  Gross per 24 hour  Intake    300 ml  Output      0 ml  Net    300 ml   Filed Weights   02/24/12 2300  Weight: 68.493 kg (151 lb)    Exam:   General:   CF, moderate to severe increased work of breathing with SCM, suprasternal, supraclavicular, subcostal retractions this afternoon  HEENT:  MMM  Cardiovascular:  RRR, S1, S2, difficult to hear  Respiratory:  Very diminished at the bases, full very faint and high pitched expiratory wheeze with I:E of 1:8 (patient had to take breath before fully exhaled)  Abdomen: NABS, distended, firm, nontender  MSK:  No LEE    Data Reviewed: Basic Metabolic Panel:  Lab 02/26/12 8119 02/25/12 0515 02/24/12 1310  NA 131* 136 135  K 5.1 4.4 4.0  CL 94* 95* 95*  CO2 20 28 29   GLUCOSE 350* 311* 284*  BUN 29* 21 20  CREATININE 0.71 0.68 0.79  CALCIUM 9.3 9.1 9.4  MG -- -- --  PHOS -- -- --   Liver Function Tests: No results  found for this basename: AST:5,ALT:5,ALKPHOS:5,BILITOT:5,PROT:5,ALBUMIN:5 in the last 168 hours No results found for this basename: LIPASE:5,AMYLASE:5 in the last 168 hours No results found for this basename: AMMONIA:5 in the last 168 hours CBC:  Lab 02/26/12 0550 02/25/12 0515 02/24/12 1310  WBC 9.9 8.4 14.7*  NEUTROABS -- -- 13.2*  HGB 12.5 13.1 12.8  HCT 36.9 38.4 37.3  MCV 92.3 91.2 92.6  PLT 184 218 207   Cardiac Enzymes: No results found for this basename: CKTOTAL:5,CKMB:5,CKMBINDEX:5,TROPONINI:5 in the last 168 hours BNP (last 3 results)  Basename 10/03/11 1248 05/17/11 0515 05/15/11 0530  PROBNP 60.0 1196.0* 1101.0*   CBG:  Lab 02/26/12 0744 02/25/12 2027 02/25/12 1638 02/25/12 1128 02/25/12 0731  GLUCAP 341*  207* 145* 205* 342*    No results found for this or any previous visit (from the past 240 hour(s)).   Studies: Ct Lumbar Spine Wo Contrast  02/24/2012  *RADIOLOGY REPORT*  Clinical Data: Trauma.  Back pain.  Compression fracture.  CT LUMBAR SPINE WITHOUT CONTRAST  Technique:  Multidetector CT imaging of the lumbar spine was performed without intravenous contrast administration. Multiplanar CT image reconstructions were also generated.  Comparison: None.  Findings: Abdominal findings are deferred to CT same day.  There is a T12 compression fracture with mild retropulsion measuring 4 mm. The very mild central stenosis.  Lumbar vertebral body height at other levels appears intact.  No other fractures are identified. This fracture is age indeterminant.  There is no paravertebral hemorrhage to suggest acute or subacute time course.  Loss of height of T12 is 75% anteriorly.  Spinous processes appear intact.  L3-L4 shows circumferential disc bulging.  Central canal appears adequately patent.  L4-L5:  Mild central stenosis associated with broad-based posterior disc bulging.  Facet hypertrophy and ligamentum flavum redundancy contributes to central stenosis.  Foramina are mildly narrowed bilaterally associated with bulging disc.  L5-S1:  Shallow disc bulge without stenosis.  Visualized portions of the sacrum appear within normal limits.  IMPRESSION: 1.  T12 compression fracture with 75% loss of vertebral body height and mild retropulsion.  The fracture is age indeterminant based on lack of paravertebral phlegmon/inflammatory changes is favored to be chronic. 2.  L3-L4 through L5-S1 degenerative disc disease.  Mild central stenosis at L4-L5.   Original Report Authenticated By: Andreas Newport, M.D.    Ct Abdomen Pelvis W Contrast  02/24/2012  *RADIOLOGY REPORT*  Clinical Data: Abdominal and back pain.  CT ABDOMEN AND PELVIS WITH CONTRAST  Technique:  Multidetector CT imaging of the abdomen and pelvis was performed  following the standard protocol during bolus administration of intravenous contrast.  Contrast: OMNIPAQUE IOHEXOL 300 MG/ML  SOLN  Comparison: Plain films of 06/01/2010.  Most recent CT of the 04/10/2006  Findings: Right base scarring with right middle lobe volume loss. Mild cardiomegaly, without pericardial or pleural effusion.Focal steatosis adjacent the falciform ligament.  Hepatomegaly again identified, with the liver measuring 18.7 cm cranial caudal.  Normal spleen, stomach.  Transverse duodenal diverticulum. Partially fatty replaced pancreas. Cholecystectomy without biliary ductal dilatation.  Normal adrenal glands and kidneys for age.  Dense atherosclerosis at the origin the left renal artery. No retroperitoneal or retrocrural adenopathy.  Scattered colonic diverticula.  Normal small bowel without abdominal ascites.    No pelvic adenopathy.    Normal urinary bladder and uterus.  No adnexal mass.  No significant free fluid.  Mild osteopenia. Moderate to mark compression deformity involves the T12 vertebral body, without significant canal compromise.  No surrounding  hemorrhage.  IMPRESSION: T12 compression deformity, moderate to severe.  New since 04/10/2006.  Also new since 04/12/2010 MRI. Given absence of surrounding hemorrhage, not felt to be acute.  No other explanation for pain.  Hepatomegaly.   Original Report Authenticated By: Consuello Bossier, M.D.    Dg Chest Portable 1 View  02/24/2012  *RADIOLOGY REPORT*  Clinical Data: Taijah Macrae of breath.  Chest pain.  Back pain.  PORTABLE CHEST - 1 VIEW  Comparison: 08/21/2011.  Findings: Cardiomegaly is present.  Bilateral basilar scarring.  No airspace disease.  No effusion. Monitoring leads are projected over the chest. The patient is rotated to the right.  Osteopenia. Severe right glenohumeral osteoarthritis.  IMPRESSION: Cardiomegaly without failure.  Stable appearance of the chest.   Original Report Authenticated By: Andreas Newport, M.D.     Scheduled  Meds:    . ipratropium  0.5 mg Nebulization Q4H   And  . albuterol  2.5 mg Nebulization Q4H  . aspirin EC  81 mg Oral Daily  . azithromycin  500 mg Intravenous Q24H  . cefTRIAXone (ROCEPHIN)  IV  1 g Intravenous Q24H  . clopidogrel  75 mg Oral Q breakfast  . enoxaparin (LOVENOX) injection  30 mg Subcutaneous Q24H  . guaiFENesin  600 mg Oral BID  . insulin aspart  0-15 Units Subcutaneous TID WC  . insulin aspart  0-5 Units Subcutaneous QHS  . insulin aspart protamine-insulin aspart  10 Units Subcutaneous Q supper  . insulin aspart protamine-insulin aspart  34 Units Subcutaneous Q breakfast  . levothyroxine  88 mcg Oral Q0600  . magnesium oxide  400 mg Oral Daily  . methylPREDNISolone (SOLU-MEDROL) injection  40 mg Intravenous Q12H  . pantoprazole  40 mg Oral Q1200  . senna  2 tablet Oral BID  . sertraline  50 mg Oral Daily  . sodium chloride  3 mL Intravenous Q12H   Continuous Infusions:   Principal Problem:  *Acute exacerbation of chronic obstructive pulmonary disease (COPD) Active Problems:  HYPOTHYROIDISM  HYPERLIPIDEMIA  OSTEOPOROSIS  COPD exacerbation  Chronic respiratory failure  Diabetes mellitus  Acute-on-chronic respiratory failure    Time spent: 30    Kadrian Partch, [facility]  [facility] Pager [phone]. If 8PM-8AM, please contact night-coverage at [email or web address], password [facility] 02/26/2012, 8:46 AM  LOS: 2 days

## 2012-02-27 ENCOUNTER — Encounter: Payer: Self-pay | Admitting: Vascular Surgery

## 2012-02-27 DIAGNOSIS — J9600 Acute respiratory failure, unspecified whether with hypoxia or hypercapnia: Secondary | ICD-10-CM | POA: Diagnosis present

## 2012-02-27 DIAGNOSIS — J9620 Acute and chronic respiratory failure, unspecified whether with hypoxia or hypercapnia: Secondary | ICD-10-CM

## 2012-02-27 DIAGNOSIS — K219 Gastro-esophageal reflux disease without esophagitis: Secondary | ICD-10-CM

## 2012-02-27 LAB — BASIC METABOLIC PANEL
BUN: 30 mg/dL — ABNORMAL HIGH (ref 6–23)
CO2: 20 mEq/L (ref 19–32)
Calcium: 9.1 mg/dL (ref 8.4–10.5)
Chloride: 97 mEq/L (ref 96–112)
Creatinine, Ser: 0.73 mg/dL (ref 0.50–1.10)

## 2012-02-27 LAB — CBC
Hemoglobin: 12.6 g/dL (ref 12.0–15.0)
MCHC: 33.7 g/dL (ref 30.0–36.0)
Platelets: 200 10*3/uL (ref 150–400)
RBC: 4.08 MIL/uL (ref 3.87–5.11)

## 2012-02-27 LAB — GLUCOSE, CAPILLARY
Glucose-Capillary: 285 mg/dL — ABNORMAL HIGH (ref 70–99)
Glucose-Capillary: 303 mg/dL — ABNORMAL HIGH (ref 70–99)
Glucose-Capillary: 306 mg/dL — ABNORMAL HIGH (ref 70–99)
Glucose-Capillary: 191 mg/dL — ABNORMAL HIGH (ref 70–99)

## 2012-02-27 MED ORDER — LISINOPRIL 10 MG PO TABS: 10.0000 mg | ORAL_TABLET | Freq: Every day | ORAL | Status: DC

## 2012-02-27 MED ORDER — PREDNISONE 10 MG PO TABS: ORAL_TABLET | ORAL | Status: DC

## 2012-02-27 MED ORDER — TIOTROPIUM BROMIDE MONOHYDRATE 18 MCG IN CAPS
18.0000 ug | ORAL_CAPSULE | Freq: Every day | RESPIRATORY_TRACT | Status: DC
  Administered 2012-02-27 – 2012-02-28 (×2): 18 ug via RESPIRATORY_TRACT
  Filled 2012-02-27: qty 5

## 2012-02-27 MED ORDER — PREDNISONE 50 MG PO TABS
50.0000 mg | ORAL_TABLET | Freq: Every day | ORAL | Status: DC
  Administered 2012-02-27 – 2012-02-28 (×2): 50 mg via ORAL
  Filled 2012-02-27 (×3): qty 1

## 2012-02-27 MED ORDER — TIOTROPIUM BROMIDE MONOHYDRATE 18 MCG IN CAPS
18.0000 ug | ORAL_CAPSULE | Freq: Every day | RESPIRATORY_TRACT | Status: DC
  Filled 2012-02-27: qty 5

## 2012-02-27 MED ORDER — INSULIN ASPART PROT & ASPART (70-30 MIX) 100 UNIT/ML ~~LOC~~ SUSP
45.0000 [IU] | Freq: Every day | SUBCUTANEOUS | Status: DC
  Administered 2012-02-28: 45 [IU] via SUBCUTANEOUS
  Filled 2012-02-27: qty 3

## 2012-02-27 MED ORDER — SODIUM CHLORIDE 0.9 % IV SOLN
INTRAVENOUS | Status: AC
  Administered 2012-02-27: 11:00:00 via INTRAVENOUS

## 2012-02-27 MED ORDER — AZITHROMYCIN 500 MG PO TABS: 500.0000 mg | ORAL_TABLET | Freq: Every day | ORAL | Status: DC

## 2012-02-27 MED ORDER — PREDNISONE 50 MG PO TABS: 50.0000 mg | ORAL_TABLET | Freq: Every day | ORAL | Status: DC

## 2012-02-27 MED ORDER — HYDROCHLOROTHIAZIDE 12.5 MG PO CAPS: 12.5000 mg | ORAL_CAPSULE | Freq: Every day | ORAL | Status: DC

## 2012-02-27 MED ORDER — HYDROCHLOROTHIAZIDE 12.5 MG PO CAPS
12.5000 mg | ORAL_CAPSULE | Freq: Every day | ORAL | Status: DC
  Administered 2012-02-27 – 2012-02-28 (×2): 12.5 mg via ORAL
  Filled 2012-02-27 (×3): qty 1

## 2012-02-27 MED ORDER — LISINOPRIL 10 MG PO TABS
10.0000 mg | ORAL_TABLET | Freq: Every day | ORAL | Status: DC
  Administered 2012-02-27 – 2012-02-28 (×2): 10 mg via ORAL
  Filled 2012-02-27 (×3): qty 1

## 2012-02-27 NOTE — Progress Notes (Signed)
[facility] PROGRESS NOTE  [name] ZOX:[id number] DOB: [birth year] DOA: 02/24/2012 PCP: No primary provider on file.  Assessment/Plan: Acute on chronic hypoxic respiratory failure most likely secondary to COPD exacerbation: -She's currently on 2 L at home in the hospital she remains intubated satting 99% on room air.  Acute on chronic COPD exacerbation  -  Duonebs q4h with albuterol q2h, start spiriva. - change steroids to PO. -  Add symbicort (home medication) -  Continue antibiotics (ceftriaxone and azithromycin) day 2 -  Continue mucinex  Diabetes: -Blood glucose has been running high and she is on steroids. She's currently on sliding-scale insulin 70/30 DC sliding-scale -Fingersticks normal to mildly elevated during day, but elevated particularly in the morning  CAD -  Continue aspirin and plavix  Elevated blood pressures without history of hypertension: -  Initiated selective BB this morning as trial while patient hospitalized, but she had increase WOB so D/Ced -  Continue to monitor patient in hospital for rhythm and blood pressure control    Hypothyroidism: -stable:  Continue synthroid.  Mood d/o:   -Stable.   -Continue zoloft, valium  GERD:   -Stable, contine protonix.    Code Status: Full code Family Communication:  Spoke with patient Disposition Plan:  Pending patient able to complete ADLs without moderate to severe respiratory distress.  To home on home O2.     Consultants:  None  Procedures:  None  Antibiotics:  Ceftriaxone 10-13 at 2300  Azithromcyin 10-13 at 2300   HPI/Subjective: She relates her shortness of breath is much improved than yesterday. She is able to talk in full sentences.  Objective: Filed Vitals:   02/26/12 2341 02/27/12 0407 02/27/12 0500 02/27/12 0901  BP:   157/78   Pulse:   67   Temp:   98.2 F (36.8 C)   TempSrc:   Oral   Resp:   20   Height:      Weight:      SpO2: 97% 96% 99% 99%     Intake/Output Summary (Last 24 hours) at 02/27/12 1034 Last data filed at 02/26/12 2100  Gross per 24 hour  Intake    720 ml  Output      0 ml  Net    720 ml   Filed Weights   02/24/12 2300  Weight: 68.493 kg (151 lb)    Exam:   General:   Awake alert and oriented x3, in no acute distress able to speak in full sentences.  HEENT:  MMM  Cardiovascular:  RRR, S1, S2, difficult to hear  Respiratory:  Good air movement with minimal wheezing bilaterally.  Abdomen: NABS, distended, firm, nontender  MSK:  No LEE    Data Reviewed: Basic Metabolic Panel:  Lab 02/27/12 8119 02/26/12 0550 02/25/12 0515 02/24/12 1310  NA 132* 131* 136 135  K 4.8 5.1 4.4 4.0  CL 97 94* 95* 95*  CO2 20 20 28 29   GLUCOSE 284* 350* 311* 284*  BUN 30* 29* 21 20  CREATININE 0.73 0.71 0.68 0.79  CALCIUM 9.1 9.3 9.1 9.4  MG -- -- -- --  PHOS -- -- -- --   Liver Function Tests: No results found for this basename: AST:5,ALT:5,ALKPHOS:5,BILITOT:5,PROT:5,ALBUMIN:5 in the last 168 hours No results found for this basename: LIPASE:5,AMYLASE:5 in the last 168 hours No results found for this basename: AMMONIA:5 in the last 168 hours CBC:  Lab 02/27/12 0545 02/26/12 0550 02/25/12 0515 02/24/12 1310  WBC 8.5 9.9 8.4 14.7*  NEUTROABS -- -- --  13.2*  HGB 12.6 12.5 13.1 12.8  HCT 37.4 36.9 38.4 37.3  MCV 91.7 92.3 91.2 92.6  PLT 200 184 218 207   Cardiac Enzymes: No results found for this basename: CKTOTAL:5,CKMB:5,CKMBINDEX:5,TROPONINI:5 in the last 168 hours BNP (last 3 results)  Basename 10/03/11 1248 05/17/11 0515 05/15/11 0530  PROBNP 60.0 1196.0* 1101.0*   CBG:  Lab 02/27/12 0727 02/26/12 2015 02/26/12 1636 02/26/12 1130 02/26/12 0744  GLUCAP 285* 246* 164* 226* 341*    No results found for this or any previous visit (from the past 240 hour(s)).   Studies: No results found.  Scheduled Meds:    . ipratropium  0.5 mg Nebulization Q4H   And  . albuterol  2.5 mg Nebulization Q4H   . aspirin EC  81 mg Oral Daily  . azithromycin  500 mg Intravenous Q24H  . cefTRIAXone (ROCEPHIN)  IV  1 g Intravenous Q24H  . clopidogrel  75 mg Oral Q breakfast  . enoxaparin (LOVENOX) injection  40 mg Subcutaneous Q24H  . guaiFENesin  600 mg Oral BID  . insulin aspart  0-15 Units Subcutaneous TID WC  . insulin aspart  0-5 Units Subcutaneous QHS  . insulin aspart protamine-insulin aspart  20 Units Subcutaneous Q supper  . insulin aspart protamine-insulin aspart  34 Units Subcutaneous Q breakfast  . levothyroxine  88 mcg Oral Q0600  . magnesium oxide  400 mg Oral Daily  . methylPREDNISolone (SOLU-MEDROL) injection  40 mg Intravenous Q6H  . pantoprazole  40 mg Oral Q1200  . senna  2 tablet Oral BID  . sertraline  50 mg Oral Daily  . sodium chloride  3 mL Intravenous Q12H  . DISCONTD: enoxaparin (LOVENOX) injection  30 mg Subcutaneous Q24H  . DISCONTD: methylPREDNISolone (SOLU-MEDROL) injection  40 mg Intravenous Q12H  . DISCONTD: metoprolol tartrate  12.5 mg Oral BID   Continuous Infusions:   Marinda Elk  [facility] Pager [phone]  If 8PM-8AM, please contact night-coverage at [email or web address], password [facility] 02/27/2012, 10:34 AM  LOS: 3 days

## 2012-02-27 NOTE — Discharge Summary (Addendum)
Physician Discharge Summary  [name] UJW:[id number] DOB: April 02, 1924 DOA: 02/24/2012  PCP: No primary provider on file.  Admit date: 02/24/2012 Discharge date: 02/28/2012  Recommendations for Outpatient Follow-up:  1. Follow up with PCP in 2-4 weeks  Discharge Diagnoses:  Principal Problem:  *Acute exacerbation of chronic obstructive pulmonary disease (COPD) Active Problems:  HYPOTHYROIDISM  HYPERLIPIDEMIA  OSTEOPOROSIS  COPD exacerbation  Chronic respiratory failure  Diabetes mellitus  Acute-on-chronic respiratory failure  Acute respiratory failure   Discharge Condition: Stable  Diet recommendation: Heart healthy diet  Filed Weights   02/24/12 2300  Weight: 68.493 kg (151 lb)    History of present illness:  78 year old female with known history of COPD end stage and chronic respiratory failure on 2 L oxygen. Since this morning she's been wheezing much worse than baseline. She denies any cough, fever, chills. She is a nebulizer without any relief. She came to the ER. In the ER patient is on nasal cannula but on his 5L. Additionally, the patient complained of back pain, located over the flanks and lower back. It was worse with movement, patient had some difficulty laying flat. This is new discomfort. Currently much improved after some pain medications history provided by the patient and her son was present at the bedside. Marland Kitchen   Hospital Course:  Acute on chronic hypoxic respiratory failure most likely secondary to COPD exacerbation:  -She's currently on 2 L at home in the hospital she remains intubated satting 99% on 2L.Marland Kitchen   Acute on chronic COPD exacerbation  - Duonebs q4h with albuterol q2h, start spiriva.  -Issues or IV antibiotics and steroids and change to oral. - Added symbicort (home medication)  - Continue antibiotics azithromycin for 5 more days. - Continue mucinex.  Diabetes:  -Blood glucose has been running high and she is on steroids. She's currently  on sliding-scale insulin 70/30 DC sliding-scale. -7030 was increased while in the hospital she will go back to her home.  CAD  - Continue aspirin and plavix   Elevated blood pressures without history of hypertension:  - Initiated selective BB, but she had increase WOB so D/Ced  - Continue to monitor patient in hospital for rhythm and blood pressure control -She was started on ACE and hydrochlorothiazide will followup with her primary care Dr. as an outpatient. Hypothyroidism:  -stable: Continue synthroid.   Mood d/o:  -Stable.  -Continue zoloft, valium   Procedures:  None  Consultations:  None  Discharge Exam: Filed Vitals:   02/28/12 0012 02/28/12 0503 02/28/12 0544 02/28/12 0728  BP:   148/63   Pulse:   56   Temp:   98.3 F (36.8 C)   TempSrc:   Oral   Resp:   17   Height:      Weight:      SpO2: 94% 95% 99% 100%    General: Awake alert and oriented x3 Cardiovascular: Regular rate and rhythm Respiratory: Good air movement clear to auscultation  Discharge Instructions      Discharge Orders    Future Appointments: Provider: Department: Dept Phone: Center:   05/15/2012 2:00 PM Julio Sicks, NP [facility] [phone] None     Future Orders Please Complete By Expires   Diet - low sodium heart healthy      Increase activity slowly          Medication List     As of 02/28/2012  8:05 AM    STOP taking these medications  NOVOLOG 100 UNIT/ML injection   Generic drug: insulin aspart      TAKE these medications         aspirin EC 81 MG tablet   Take 81 mg by mouth daily.      azithromycin 500 MG tablet   Commonly known as: ZITHROMAX   Take 1 tablet (500 mg total) by mouth daily.      budesonide 0.25 MG/2ML nebulizer solution   Commonly known as: PULMICORT   Take 2 mLs (0.25 mg total) by nebulization 2 (two) times daily.      calcium citrate-vitamin D 315-200 MG-UNIT per tablet   Commonly known as: CITRACAL+D   Take 2 tablets  by mouth daily.      cholecalciferol 1000 UNITS tablet   Commonly known as: VITAMIN D   Take 2,000 Units by mouth 2 (two) times daily.      clopidogrel 75 MG tablet   Commonly known as: PLAVIX   Take 75 mg by mouth daily.      diazepam 5 MG tablet   Commonly known as: VALIUM   Take 2.5 mg by mouth 2 (two) times daily as needed.      ferrous sulfate 325 (65 FE) MG EC tablet   Take 325 mg by mouth daily.      guaiFENesin 600 MG 12 hr tablet   Commonly known as: MUCINEX   1-2 every 12 hour as needed for cough and congestion      hydrochlorothiazide 12.5 MG capsule   Commonly known as: MICROZIDE   Take 1 capsule (12.5 mg total) by mouth daily.      insulin lispro protamine-insulin lispro (75-25) 100 UNIT/ML Susp   Commonly known as: HUMALOG 75/25   34 units with breakfast, 10 units with dinner      LASIX 40 MG tablet   Generic drug: furosemide   Take 1 and 1/2 tabs by mouth once daily      levothyroxine 88 MCG tablet   Commonly known as: SYNTHROID, LEVOTHROID   Take 88 mcg by mouth daily.      lisinopril 10 MG tablet   Commonly known as: PRINIVIL,ZESTRIL   Take 1 tablet (10 mg total) by mouth daily.      Magnesium 250 MG Tabs   Take 1 tablet by mouth daily.      omeprazole 20 MG capsule   Commonly known as: PRILOSEC   Take 1 capsule by mouth 2 (two) times daily before a meal.      Potassium Gluconate 595 MG Caps   1 tablet by mouth once daily      predniSONE 10 MG tablet   Commonly known as: DELTASONE   Take 1 tab by mouth once daily or increase to 2 tab until back to baseline when needed for increased shortness of breath      predniSONE 10 MG tablet   Commonly known as: DELTASONE   Takes 6 tablets for 1 days, then 5 tablets for 1 days, then 4 tablets for 1 days, then 3 tablets for 1 days, then 2 tabs for 1 days, then 1 tab for 1 days, and then stop.      albuterol (2.5 MG/3ML) 0.083% nebulizer solution   Commonly known as: PROVENTIL   Take 2.5 mg by  nebulization every 4 (four) hours as needed. For wheezing      PROVENTIL HFA 108 (90 BASE) MCG/ACT inhaler   Generic drug: albuterol   Inhale 2 puffs into the lungs every  4 (four) hours as needed. For shortness of breath      senna 8.6 MG tablet   Commonly known as: SENOKOT   Take 2 tablets by mouth 2 (two) times daily.      sertraline 50 MG tablet   Commonly known as: ZOLOFT   Take 50 mg by mouth daily.      sodium chloride 0.65 % nasal spray   Commonly known as: OCEAN   Place 2 sprays into the nose 2 (two) times daily as needed for congestion.      tiotropium 18 MCG inhalation capsule   Commonly known as: SPIRIVA   Place 18 mcg into inhaler and inhale daily.      vitamin C 500 MG tablet   Commonly known as: ASCORBIC ACID   Take 1,000 mg by mouth daily.            The results of significant diagnostics from this hospitalization (including imaging, microbiology, ancillary and laboratory) are listed below for reference.    Significant Diagnostic Studies: Ct Lumbar Spine Wo Contrast  02/24/2012  *RADIOLOGY REPORT*  Clinical Data: Trauma.  Back pain.  Compression fracture.  CT LUMBAR SPINE WITHOUT CONTRAST  Technique:  Multidetector CT imaging of the lumbar spine was performed without intravenous contrast administration. Multiplanar CT image reconstructions were also generated.  Comparison: None.  Findings: Abdominal findings are deferred to CT same day.  There is a T12 compression fracture with mild retropulsion measuring 4 mm. The very mild central stenosis.  Lumbar vertebral body height at other levels appears intact.  No other fractures are identified. This fracture is age indeterminant.  There is no paravertebral hemorrhage to suggest acute or subacute time course.  Loss of height of T12 is 75% anteriorly.  Spinous processes appear intact.  L3-L4 shows circumferential disc bulging.  Central canal appears adequately patent.  L4-L5:  Mild central stenosis associated with  broad-based posterior disc bulging.  Facet hypertrophy and ligamentum flavum redundancy contributes to central stenosis.  Foramina are mildly narrowed bilaterally associated with bulging disc.  L5-S1:  Shallow disc bulge without stenosis.  Visualized portions of the sacrum appear within normal limits.  IMPRESSION: 1.  T12 compression fracture with 75% loss of vertebral body height and mild retropulsion.  The fracture is age indeterminant based on lack of paravertebral phlegmon/inflammatory changes is favored to be chronic. 2.  L3-L4 through L5-S1 degenerative disc disease.  Mild central stenosis at L4-L5.   Original Report Authenticated By: Andreas Newport, M.D.    Ct Abdomen Pelvis W Contrast  02/24/2012  *RADIOLOGY REPORT*  Clinical Data: Abdominal and back pain.  CT ABDOMEN AND PELVIS WITH CONTRAST  Technique:  Multidetector CT imaging of the abdomen and pelvis was performed following the standard protocol during bolus administration of intravenous contrast.  Contrast: OMNIPAQUE IOHEXOL 300 MG/ML  SOLN  Comparison: Plain films of 06/01/2010.  Most recent CT of the 04/10/2006  Findings: Right base scarring with right middle lobe volume loss. Mild cardiomegaly, without pericardial or pleural effusion.Focal steatosis adjacent the falciform ligament.  Hepatomegaly again identified, with the liver measuring 18.7 cm cranial caudal.  Normal spleen, stomach.  Transverse duodenal diverticulum. Partially fatty replaced pancreas. Cholecystectomy without biliary ductal dilatation.  Normal adrenal glands and kidneys for age.  Dense atherosclerosis at the origin the left renal artery. No retroperitoneal or retrocrural adenopathy.  Scattered colonic diverticula.  Normal small bowel without abdominal ascites.    No pelvic adenopathy.    Normal urinary bladder and uterus.  No adnexal mass.  No significant free fluid.  Mild osteopenia. Moderate to mark compression deformity involves the T12 vertebral body, without  significant canal compromise.  No surrounding hemorrhage.  IMPRESSION: T12 compression deformity, moderate to severe.  New since 04/10/2006.  Also new since 04/12/2010 MRI. Given absence of surrounding hemorrhage, not felt to be acute.  No other explanation for pain.  Hepatomegaly.   Original Report Authenticated By: Consuello Bossier, M.D.    Dg Chest Portable 1 View  02/24/2012  *RADIOLOGY REPORT*  Clinical Data: Short of breath.  Chest pain.  Back pain.  PORTABLE CHEST - 1 VIEW  Comparison: 08/21/2011.  Findings: Cardiomegaly is present.  Bilateral basilar scarring.  No airspace disease.  No effusion. Monitoring leads are projected over the chest. The patient is rotated to the right.  Osteopenia. Severe right glenohumeral osteoarthritis.  IMPRESSION: Cardiomegaly without failure.  Stable appearance of the chest.   Original Report Authenticated By: Andreas Newport, M.D.     Microbiology: No results found for this or any previous visit (from the past 240 hour(s)).   Labs: Basic Metabolic Panel:  Lab 02/28/12 4098 02/27/12 0545 02/26/12 0550 02/25/12 0515 02/24/12 1310  NA 137 132* 131* 136 135  K 3.9 4.8 5.1 4.4 4.0  CL 101 97 94* 95* 95*  CO2 30 20 20 28 29   GLUCOSE 156* 284* 350* 311* 284*  BUN 27* 30* 29* 21 20  CREATININE 0.67 0.73 0.71 0.68 0.79  CALCIUM 8.7 9.1 9.3 9.1 9.4  MG -- -- -- -- --  PHOS -- -- -- -- --   Liver Function Tests: No results found for this basename: AST:5,ALT:5,ALKPHOS:5,BILITOT:5,PROT:5,ALBUMIN:5 in the last 168 hours No results found for this basename: LIPASE:5,AMYLASE:5 in the last 168 hours No results found for this basename: AMMONIA:5 in the last 168 hours CBC:  Lab 02/28/12 0612 02/27/12 0545 02/26/12 0550 02/25/12 0515 02/24/12 1310  WBC 8.1 8.5 9.9 8.4 14.7*  NEUTROABS -- -- -- -- 13.2*  HGB 11.9* 12.6 12.5 13.1 12.8  HCT 35.2* 37.4 36.9 38.4 37.3  MCV 92.6 91.7 92.3 91.2 92.6  PLT 199 200 184 218 207   Cardiac Enzymes: No results found for  this basename: CKTOTAL:5,CKMB:5,CKMBINDEX:5,TROPONINI:5 in the last 168 hours BNP: BNP (last 3 results)  Basename 10/03/11 1248 05/17/11 0515 05/15/11 0530  PROBNP 60.0 1196.0* 1101.0*   CBG:  Lab 02/27/12 2021 02/27/12 1635 02/27/12 1114 02/27/12 0727 02/26/12 2015  GLUCAP 191* 306* 303* 285* 246*    Time coordinating discharge: *30 minutes minutes  Signed:  David Stall, Maveryck Bahri  [facility] 02/28/2012, 8:05 AM

## 2012-02-27 NOTE — Progress Notes (Signed)
Pt CBG 303 at lunch check. Dr. David Stall notified of SSI discontinued. 70/30 basal insulin increased. NO new orders at this time. Will continue to monitor patient closely. Ramond Craver, RN

## 2012-02-27 NOTE — Progress Notes (Signed)
Pt ambulated with nursing staff in hallway 27ft on 2L O2. Pt o2 sats remained 97-100% but patient became very dizzy and "wobbly" and felt like she was "going to pass out". Pt was put in chair and pushed back into room. BP found to be 110/76. HR remained NSR in 80s. Pt stated "I feel fine laying in the chair but cant catch my breath when I'm moving around." Will notify MD and continue to monitor patient. Ramond Craver, RN

## 2012-02-27 NOTE — Progress Notes (Signed)
Inpatient Diabetes Program Recommendations  AACE/ADA: New Consensus Statement on Inpatient Glycemic Control (2013)  Target Ranges:  Prepandial:   less than 140 mg/dL      Peak postprandial:   less than 180 mg/dL (1-2 hours)      Critically ill patients:  140 - 180 mg/dL    Results for [name], [name] (MRN [id number]) as of 02/27/2012 10:42  Ref. Range 02/26/2012 07:44 02/26/2012 11:30 02/26/2012 16:36 02/26/2012 20:15  Glucose-Capillary Latest Range: 70-99 mg/dL 409 (H) 811 (H) 914 (H) 246 (H)    Results for [name], [name] (MRN [id number]) as of 02/27/2012 10:42  Ref. Range 02/27/2012 07:27  Glucose-Capillary Latest Range: 70-99 mg/dL 086 (H)   Patient elevated at lunch yesterday and still elevated this morning.  Noted 70/30 supper dose increased to 20 units last night.  Still elevated today.  Recommend continued upward slow titration of 70/30 insulin (both doses):  Inpatient Diabetes Program Recommendations Insulin - Basal: Please increase breakfast dose of 70/30 to 36 units and supper dose of 70/30 to 25 units.  Note: Will follow. Ambrose Finland RN, MSN, CDE Diabetes Coordinator Inpatient Diabetes Program [phone]

## 2012-02-27 NOTE — Evaluation (Signed)
Physical Therapy Evaluation Patient Details Name: [name] MRN: [id number] DOB: 10-Dec-1923 Today's Date: 02/27/2012 Time: 0814-     PT Assessment / Plan / Recommendation Clinical Impression  pt presents with COPD exacerbation.  pt with mild memory deficits and seems aware of these deficits.  pt would benefit from return home with husband and daily family A with HHPT.      PT Assessment  Patient needs continued PT services    Follow Up Recommendations  Home health PT;Supervision - Intermittent    Does the patient have the potential to tolerate intense rehabilitation      Barriers to Discharge None      Equipment Recommendations  None recommended by PT    Recommendations for Other Services     Frequency Min 3X/week    Precautions / Restrictions Precautions Precautions: Fall Restrictions Weight Bearing Restrictions: No   Pertinent Vitals/Pain Denies pain.        Mobility  Bed Mobility Bed Mobility: Not assessed (Sitting EOB) Transfers Transfers: Sit to Stand;Stand to Sit Sit to Stand: 4: Min guard;4: Min assist;With upper extremity assist;From bed;From toilet Stand to Sit: 4: Min guard;With upper extremity assist;To toilet;To chair/3-in-1;With armrests Details for Transfer Assistance: pt MinA for stand from toilet.  pt needs cues for safe use of UEs.   Ambulation/Gait Ambulation/Gait Assistance: 4: Min guard Ambulation Distance (Feet): 20 Feet (and 15') Assistive device: Rolling walker Ambulation/Gait Assistance Details: pt needs cues to stay closer to RW, upright posture.  MinA needed to negotiate RW in/out of bathroom.   Gait Pattern: Step-through pattern;Decreased stride length;Trunk flexed Stairs: No Wheelchair Mobility Wheelchair Mobility: No    Shoulder Instructions     Exercises     PT Diagnosis: Difficulty walking (Decondition/Debility)  PT Problem List: Decreased activity tolerance;Decreased balance;Decreased mobility;Decreased knowledge of  use of DME;Cardiopulmonary status limiting activity PT Treatment Interventions: DME instruction;Gait training;Stair training;Functional mobility training;Therapeutic activities;Therapeutic exercise;Balance training;Patient/family education   PT Goals Acute Rehab PT Goals PT Goal Formulation: With patient Time For Goal Achievement: 03/12/12 Potential to Achieve Goals: Good Pt will go Supine/Side to Sit: with modified independence PT Goal: Supine/Side to Sit - Progress: Goal set today Pt will go Sit to Supine/Side: with modified independence PT Goal: Sit to Supine/Side - Progress: Goal set today Pt will go Sit to Stand: with modified independence PT Goal: Sit to Stand - Progress: Goal set today Pt will go Stand to Sit: with modified independence PT Goal: Stand to Sit - Progress: Goal set today Pt will Ambulate: >150 feet;with modified independence;with rolling walker PT Goal: Ambulate - Progress: Goal set today Pt will Go Up / Down Stairs: 3-5 stairs;with supervision;with rail(s) PT Goal: Up/Down Stairs - Progress: Goal set today  Visit Information  Last PT Received On: 02/27/12 Assistance Needed: +1    Subjective Data  Subjective: MY daughter and son-in-law are in [place] right now.   Patient Stated Goal: Home   Prior Functioning  Home Living Lives With: Spouse Available Help at Discharge: Family;Available PRN/intermittently (Children check on pt and spouse daily.  ) Type of Home: House Home Access: Stairs to enter [employer] of Steps: 4 Entrance Stairs-Rails: Right;Left;Can reach both Home Layout: Two level;Able to live on main level with bedroom/bathroom Alternate Level Stairs-Number of Steps:  (Doesn't use) Bathroom Shower/Tub: Engineer, manufacturing systems: Standard Bathroom Accessibility: Yes How Accessible: Accessible via walker Home Adaptive Equipment: Bedside commode/3-in-1;Grab bars around toilet;Grab bars in shower;Raised toilet seat with rails;Tub  transfer bench;Walker - rolling Sports administrator  chair) Prior Function Level of Independence: Needs assistance Needs Assistance: Meal Prep;Light Housekeeping;Gait Meal Prep: Maximal Light Housekeeping: Maximal Gait Assistance: Uses RW Able to Take Stairs?: Yes Driving: No Vocation: Retired Comments: pt notes daughter does cooking and cleaning for her and spouse, however daughter out of town, so son is A pt right now.  pt doesn't remember when daughter will be back from trip.   Communication Communication: HOH    Cognition  Overall Cognitive Status: Impaired Area of Impairment: Memory Arousal/Alertness: Awake/alert Orientation Level: Appears intact for tasks assessed Behavior During Session: [facility] for tasks performed Memory Deficits: pt with decreased recall of when daughter will return from trip.  pt states she has some memory trouble.      Extremity/Trunk Assessment Right Lower Extremity Assessment RLE ROM/Strength/Tone: WFL for tasks assessed RLE Sensation: WFL - Light Touch Left Lower Extremity Assessment LLE ROM/Strength/Tone: WFL for tasks assessed LLE Sensation: WFL - Light Touch Trunk Assessment Trunk Assessment: Kyphotic   Balance Balance Balance Assessed: Yes Dynamic Standing Balance Dynamic Standing - Balance Support: No upper extremity supported;During functional activity Dynamic Standing - Level of Assistance: 5: Stand by assistance Dynamic Standing - Comments: pt leans against sink during hand hygiene and brushing teeth.  pt demos good balance without UE support.    End of Session PT - End of Session Equipment Utilized During Treatment: Gait belt Activity Tolerance: Patient tolerated treatment well Patient left: in chair;with call bell/phone within reach Nurse Communication: Mobility status  GP     Sunny Schlein, [place] [phone] 02/27/2012, 10:03 AM

## 2012-02-27 NOTE — Evaluation (Signed)
Occupational Therapy Evaluation Patient Details Name: [name] MRN: [id number] DOB: 1923/11/05 Today's Date: 02/27/2012 Time: 7846-9629 OT Time Calculation (min): 19 min  OT Assessment / Plan / Recommendation Clinical Impression  Pt admitted with COPD exacerbation.  Pt with mild memory deficits and seems aware of these deficits. Pt will benefit from skilled OT in the acute settiing to maximize I with ADL and ADL mobility prior to d/c. Pt will need 24hr supervision/assist upon d/c home. otherwise, will need ST-SNF.    OT Assessment  Patient needs continued OT Services    Follow Up Recommendations  Home health OT;Supervision/Assistance - 24 hour    Barriers to Discharge Decreased caregiver support questionable support at home  Equipment Recommendations  None recommended by OT;None recommended by PT    Recommendations for Other Services    Frequency  Min 2X/week    Precautions / Restrictions Precautions Precautions: Fall Restrictions Weight Bearing Restrictions: No   Pertinent Vitals/Pain Pt denies any pain    ADL  Grooming: Performed;Brushing hair;Supervision/safety Where Assessed - Grooming: Unsupported sitting Lower Body Bathing: Simulated;Minimal assistance Where Assessed - Lower Body Bathing: Supported sit to stand Lower Body Dressing: Performed;Minimal assistance Where Assessed - Lower Body Dressing: Unsupported sitting (don socks) Toilet Transfer: Performed;Minimal assistance Toilet Transfer Method: Sit to Barista: Regular height toilet;Grab bars Toileting - Clothing Manipulation and Hygiene: Performed;Min guard Where Assessed - Engineer, mining and Hygiene: Sit to stand from 3-in-1 or toilet Equipment Used: Gait belt Transfers/Ambulation Related to ADLs: Min guard A with ambulation throughout room and hallway. Min A to negotiate RW into/out of bathroom ADL Comments: Pt with multiple variations of home situation and  PLOF. CSW is to check into this    OT Diagnosis: Generalized weakness  OT Problem List: Impaired balance (sitting and/or standing);Decreased activity tolerance;Decreased knowledge of use of DME or AE;Decreased knowledge of precautions OT Treatment Interventions: Self-care/ADL training;DME and/or AE instruction;Patient/family education;Balance training   OT Goals Acute Rehab OT Goals OT Goal Formulation: With patient Time For Goal Achievement: 03/05/12 Potential to Achieve Goals: Good ADL Goals Pt Will Perform Grooming: Independently;Standing at sink ADL Goal: Grooming - Progress: Goal set today Pt Will Perform Upper Body Dressing: Independently;Sitting, bed;Sitting, chair ADL Goal: Upper Body Dressing - Progress: Goal set today Pt Will Perform Lower Body Dressing: with supervision;Sit to stand from chair;Sit to stand from bed ADL Goal: Lower Body Dressing - Progress: Goal set today Pt Will Transfer to Toilet: with supervision;Ambulation;with DME ADL Goal: Toilet Transfer - Progress: Goal set today Pt Will Perform Toileting - Clothing Manipulation: with supervision;Sitting on 3-in-1 or toilet;Standing ADL Goal: Toileting - Clothing Manipulation - Progress: Goal set today Pt Will Perform Toileting - Hygiene: Independently;Sitting on 3-in-1 or toilet ADL Goal: Toileting - Hygiene - Progress: Goal set today  Visit Information  Last OT Received On: 02/27/12 Assistance Needed: +1    Subjective Data  Subjective: My daughter's in [place] Patient Stated Goal: Return home   Prior Functioning     Home Living Lives With: Spouse Available Help at Discharge: Family;Available PRN/intermittently (Children check on pt and spouse daily.  ) Type of Home: House Home Access: Stairs to enter [employer] of Steps: 4 Entrance Stairs-Rails: Right;Left;Can reach both Home Layout: Two level;Able to live on main level with bedroom/bathroom Alternate Level Stairs-Number of Steps:  (Doesn't  use) Bathroom Shower/Tub: Engineer, manufacturing systems: Standard Bathroom Accessibility: Yes How Accessible: Accessible via walker Home Adaptive Equipment: Bedside commode/3-in-1;Grab bars around toilet;Grab bars in  shower;Raised toilet seat with rails;Tub transfer bench;Walker - rolling (Transport chair) Prior Function Level of Independence: Needs assistance Needs Assistance: Meal Prep;Light Housekeeping;Gait Meal Prep: Maximal Light Housekeeping: Maximal Gait Assistance: Uses RW Able to Take Stairs?: Yes Driving: No Vocation: Retired Comments: pt notes daughter does cooking and cleaning for her and spouse, however daughter out of town, so son is A pt right now.  pt doesn't remember when daughter will be back from trip.   Communication Communication: HOH Dominant Hand: Right         Vision/Perception Vision - Assessment Additional Comments: pt states she has prescription for other glasses but has not picked them up   Cognition  Overall Cognitive Status: Impaired Area of Impairment: Memory Arousal/Alertness: Awake/alert Orientation Level: Appears intact for tasks assessed Behavior During Session: [facility] for tasks performed Memory Deficits: pt with decreased recall of when daughter will return from trip.  pt states she has some memory trouble.      Extremity/Trunk Assessment Right Upper Extremity Assessment RUE ROM/Strength/Tone: WFL for tasks assessed RUE Sensation: WFL - Light Touch RUE Coordination: WFL - gross/fine motor Left Upper Extremity Assessment LUE ROM/Strength/Tone: WFL for tasks assessed LUE Sensation: WFL - Light Touch LUE Coordination: WFL - gross/fine motor Right Lower Extremity Assessment RLE ROM/Strength/Tone: WFL for tasks assessed RLE Sensation: WFL - Light Touch Left Lower Extremity Assessment LLE ROM/Strength/Tone: WFL for tasks assessed LLE Sensation: WFL - Light Touch Trunk Assessment Trunk Assessment: Kyphotic     Mobility Bed  Mobility Bed Mobility: Not assessed (Sitting EOB) Transfers Sit to Stand: 4: Min assist;From toilet (Min guard A from other surfaces) Stand to Sit: 4: Min guard;With upper extremity assist;To toilet;To chair/3-in-1;With armrests Details for Transfer Assistance: Pt requires greater (Min A) from lower surfaces     Shoulder Instructions     Exercise     Balance Balance Balance Assessed: Yes Dynamic Standing Balance Dynamic Standing - Balance Support: No upper extremity supported;During functional activity Dynamic Standing - Level of Assistance: 5: Stand by assistance Dynamic Standing - Comments: pt leans against sink during hand hygiene and brushing teeth.  pt demos good balance without UE support.     End of Session OT - End of Session Equipment Utilized During Treatment: Gait belt Activity Tolerance: Patient tolerated treatment well Patient left: in chair;with call bell/phone within reach Nurse Communication: Mobility status  GO     Mylan Lengyel 02/27/2012, 10:59 AM

## 2012-02-28 DIAGNOSIS — J9600 Acute respiratory failure, unspecified whether with hypoxia or hypercapnia: Secondary | ICD-10-CM

## 2012-02-28 DIAGNOSIS — I10 Essential (primary) hypertension: Secondary | ICD-10-CM

## 2012-02-28 DIAGNOSIS — E119 Type 2 diabetes mellitus without complications: Secondary | ICD-10-CM

## 2012-02-28 LAB — BASIC METABOLIC PANEL
CO2: 30 mEq/L (ref 19–32)
Calcium: 8.7 mg/dL (ref 8.4–10.5)
Chloride: 101 mEq/L (ref 96–112)
Glucose, Bld: 156 mg/dL — ABNORMAL HIGH (ref 70–99)
Potassium: 3.9 mEq/L (ref 3.5–5.1)
Sodium: 137 mEq/L (ref 135–145)

## 2012-02-28 LAB — GLUCOSE, CAPILLARY: Glucose-Capillary: 118 mg/dL — ABNORMAL HIGH (ref 70–99)

## 2012-02-28 LAB — CBC
Hemoglobin: 11.9 g/dL — ABNORMAL LOW (ref 12.0–15.0)
Platelets: 199 10*3/uL (ref 150–400)
RBC: 3.8 MIL/uL — ABNORMAL LOW (ref 3.87–5.11)
WBC: 8.1 10*3/uL (ref 4.0–10.5)

## 2012-02-28 MED ORDER — ALBUTEROL SULFATE (5 MG/ML) 0.5% IN NEBU
2.5000 mg | INHALATION_SOLUTION | Freq: Three times a day (TID) | RESPIRATORY_TRACT | Status: DC
  Filled 2012-02-28: qty 0.5

## 2012-02-28 NOTE — Clinical Social Work Placement (Addendum)
    Clinical Social Work Department CLINICAL SOCIAL WORK PLACEMENT NOTE 02/28/2012  Patient:  [name], [name]  Account Number:  [id number] Admit date:  02/24/2012  Clinical Social Worker:  Margaree Mackintosh  Date/time:  02/28/2012 11:16 AM  Clinical Social Work is seeking post-discharge placement for this patient at the following level of care:   SKILLED NURSING   (*CSW will update this form in Epic as items are completed)   02/28/2012  Patient/family provided with [facility] Department of Clinical Social Work's list of facilities offering this level of care within the geographic area requested by the patient (or if unable, by the patient's family).  02/28/2012  Patient/family informed of their freedom to choose among providers that offer the needed level of care, that participate in Medicare, Medicaid or managed care program needed by the patient, have an available bed and are willing to accept the patient.  02/28/2012  Patient/family informed of [facility]' ownership interest in [facility], as well as of the fact that they are under no obligation to receive care at this facility.  PASARR submitted to EDS on 02/28/2012 PASARR number received from EDS on 02/28/2012  FL2 transmitted to all facilities in geographic area requested by pt/family on  02/28/2012 FL2 transmitted to all facilities within larger geographic area on   Patient informed that his/her managed care company has contracts with or will negotiate with  certain facilities, including the following:     Patient/family informed of bed offers received:  02/28/12 Patient chooses bed at [facility] Physician recommends and patient chooses bed at    Patient to be transferred to [facility] on 02/28/12  Patient to be transferred to facility by [facility].  The following physician request were entered in Epic:   Additional Comments:

## 2012-02-28 NOTE — Clinical Social Work Psychosocial (Signed)
     Clinical Social Work Department BRIEF PSYCHOSOCIAL ASSESSMENT 02/28/2012  Patient:  [name], [name]     Account Number:  [id number]     Admit date:  02/24/2012  Clinical Social Worker:  Margaree Mackintosh  Date/Time:  02/28/2012 11:13 AM  Referred by:  Care Management  Date Referred:  02/28/2012 Referred for  SNF Placement   Other Referral:   Interview type:  Patient Other interview type:   Son-Mike    PSYCHOSOCIAL DATA Living Status:  FAMILY Admitted from facility:   Level of care:   Primary support name:  Kathlene November: [phone] Primary support relationship to patient:  CHILD, ADULT Degree of support available:   Adequate.    CURRENT CONCERNS Current Concerns  Post-Acute Placement   Other Concerns:    SOCIAL WORK ASSESSMENT / PLAN Clinical Social Worker recieved referral indicating pt's family was unable to provide 24/7 care until pt's dtr return from her trip in [place] next Thursday; pt will require SNF.  CSW reviewed chart, met wtih pt at bedside, introduced self, and explained role.  Pt agreeable to SNF but not to [facility].  CSW updated son-Mike.  CSW began SNF search. CSW to continue to follow and assist as needed.   Assessment/plan status:  Information/Referral to [facility] Other assessment/ plan:   Information/referral to community resources:   SNF.    PATIENTS/FAMILYS RESPONSE TO PLAN OF CARE: Pt was engaged in conversation.  Pt and son thanked CSW for intervention.

## 2012-02-28 NOTE — Progress Notes (Signed)
Physical Therapy Treatment Patient Details Name: [name] MRN: [id number] DOB: 1924-01-03 Today's Date: 02/28/2012 Time: 4098-1191 PT Time Calculation (min): 17 min  PT Assessment / Plan / Recommendation Comments on Treatment Session  pt presents with COPD exacerbation.  pt much more debilitated today.  Very fatigued and SOB just sitting to EOB.  pt indicating very painful in back with any movement.  Discussed with pt plan for D/C to SNF instead of to home to allow more time to recover.      Follow Up Recommendations  Post acute inpatient     Does the patient have the potential to tolerate intense rehabilitation  No, Recommend SNF  Barriers to Discharge        Equipment Recommendations  None recommended by OT;None recommended by PT    Recommendations for Other Services    Frequency Min 3X/week   Plan Discharge plan needs to be updated;Frequency remains appropriate    Precautions / Restrictions Precautions Precautions: Fall Restrictions Weight Bearing Restrictions: No   Pertinent Vitals/Pain Pt indicates low back very painful.  RN made aware.      Mobility  Bed Mobility Bed Mobility: Supine to Sit;Sitting - Scoot to Edge of Bed;Sit to Supine Supine to Sit: 3: Mod assist Sitting - Scoot to Edge of Bed: 3: Mod assist Sit to Supine: 4: Min assist Details for Bed Mobility Assistance: cues for sequencing.   Transfers Transfers: Not assessed Ambulation/Gait Ambulation/Gait Assistance: Not tested (comment) Stairs: No Wheelchair Mobility Wheelchair Mobility: No    Exercises     PT Diagnosis:    PT Problem List:   PT Treatment Interventions:     PT Goals Acute Rehab PT Goals Time For Goal Achievement: 03/12/12 PT Goal: Supine/Side to Sit - Progress: Progressing toward goal PT Goal: Sit to Supine/Side - Progress: Progressing toward goal  Visit Information  Last PT Received On: 02/28/12 Assistance Needed: +1    Subjective Data  Subjective: I just don't  feel good.     Cognition  Overall Cognitive Status: Impaired Area of Impairment: Memory Arousal/Alertness: Awake/alert Orientation Level: Appears intact for tasks assessed Behavior During Session: [facility] for tasks performed Memory Deficits: pt with decreased recall of when daughter will return from trip.  pt states she has some memory trouble.      Balance  Balance Balance Assessed: Yes Static Sitting Balance Static Sitting - Balance Support: Left upper extremity supported;Feet supported Static Sitting - Level of Assistance: 5: Stand by assistance;4: Min assist Static Sitting - Comment/# of Minutes: pt with increased effort to staying sitting today and at times props on PT for support.    End of Session PT - End of Session Equipment Utilized During Treatment: Oxygen Activity Tolerance: Patient limited by fatigue;Patient limited by pain Patient left: in bed;with call bell/phone within reach Nurse Communication: Mobility status;Patient requests pain meds   GP     Taina Landry, Alison Murray, [place] [phone] 02/28/2012, 1:50 PM

## 2012-02-28 NOTE — Progress Notes (Signed)
Clinical Social Worker received phone call from pt's son, Kathlene November.  Kathlene November has reviewed bed offers and would like pt to dc to [facility].  CSW made [facility] aware and updated RN.  CSW to continue to follow and assist as needed.   Angelia Mould, MSW, [place] [phone]

## 2012-02-29 LAB — GLUCOSE, CAPILLARY: Glucose-Capillary: 132 mg/dL — ABNORMAL HIGH (ref 70–99)

## 2012-05-06 ENCOUNTER — Telehealth: Payer: Self-pay | Admitting: Internal Medicine

## 2012-05-06 NOTE — Telephone Encounter (Signed)
i spoke with daughter and is aware of tp recs. She voiced her understanding. Nothing further was needed

## 2012-05-06 NOTE — Telephone Encounter (Signed)
Mucinex DM Twice daily   Taper prednisone by 10mg  every 4 days until back to your baseline  Does not sound like she needs another abx at this time  Please contact office for sooner follow up if symptoms do not improve or worsen or seek emergency care

## 2012-05-06 NOTE — Telephone Encounter (Signed)
i spoke with daughter. She states pt finished a ZPAK and prednisone from Dr. Larina Bras king. She is still having a lot of wheezing, increase SOB, occasional dry cough, chest congestion/ her sat's are staying 95-95%. She increased pt prednisone to 40 mg yesterday and taking mucinex BID. She does not want pt to end up in the hospital over the holiday. She is wanting to know if cipro can be called. Please advise Dr. Sherene Sires thanks  Allergies  Allergen Reactions  . Levofloxacin     Unable to sleep, pt stated that she felt crazy--only took for 3 days  . Statins   . Sulfonamide Derivatives   . Penicillins Rash

## 2012-05-08 ENCOUNTER — Telehealth: Payer: Self-pay | Admitting: Internal Medicine

## 2012-05-08 NOTE — Telephone Encounter (Signed)
No change recs, needs ov next available

## 2012-05-08 NOTE — Telephone Encounter (Signed)
Called, spoke with Roxanne.  Informed her of below per MW.  She verbalized understanding of this.  Pt already has a pending OV with TP on Thursday, May 15, 2012.  Leandro Reasoner will inform pt's daughter to have pt keep this appt.  They will call back if symptoms do not improve or worsen in the meantime.  Nothing further needed at this time.

## 2012-05-08 NOTE — Telephone Encounter (Signed)
Called, spoke with Roxanne with [place].  States she visited pt today for the first time since last week.  Reports a new onset on crackles in RLL since last week when she seen pt.  Also, reports pt has auditory wheezing but "always has this" and nonprod cough at times when laying down.  States pt denies any c/o increased SOB from baseline, chest pain, or chest tightness.  Temp 97.6, BP 130/70, o2 sat 98% on 2lpm, respirations 20.  Daughter called in on 05/06/12 and was advised to:  PARRETT,TAMMY, NP 05/06/2012 12:42 PM Signed  Mucinex DM Twice daily  Taper prednisone by 10mg  every 4 days until back to your baseline  Does not sound like she needs another abx at this time  Please contact office for sooner follow up if symptoms do not improve or worsen or seek emergency care   -------  Per Leandro Reasoner, pt's daughter reports pt sounds better since Tuesday when starting the pred taper.  Roxanne would like to know if MW has any further recs.  Pls advise.  Thank you.

## 2012-05-09 ENCOUNTER — Telehealth: Payer: Self-pay | Admitting: Internal Medicine

## 2012-05-09 MED ORDER — ALBUTEROL SULFATE HFA 108 (90 BASE) MCG/ACT IN AERS: 2.0000 | INHALATION_SPRAY | RESPIRATORY_TRACT | Status: DC | PRN

## 2012-05-09 NOTE — Telephone Encounter (Signed)
Spoke with patients daughter, patient needs albuterol sent into [facility] not [name].  Rx needed now patient is out of medication.  Rx sent and nothing further needed.

## 2012-05-12 ENCOUNTER — Telehealth: Payer: Self-pay | Admitting: Internal Medicine

## 2012-05-12 MED ORDER — ALBUTEROL SULFATE (2.5 MG/3ML) 0.083% IN NEBU: 2.5000 mg | INHALATION_SOLUTION | RESPIRATORY_TRACT | Status: DC | PRN

## 2012-05-12 NOTE — Telephone Encounter (Signed)
Spoke with [name] with pharmacy pt needed the albuterol neb meds sent not inhaler rx verbally gave to pharm for  proventil 2.5mg /41ml use as directed #180 vials X3.t They will ship to pt today.

## 2012-05-15 ENCOUNTER — Ambulatory Visit (INDEPENDENT_AMBULATORY_CARE_PROVIDER_SITE_OTHER): Payer: Medicare Other | Admitting: Adult Health

## 2012-05-15 ENCOUNTER — Encounter: Payer: Self-pay | Admitting: Adult Health

## 2012-05-15 VITALS — BP 116/62 | HR 78 | Temp 98.0°F | Ht 61.0 in | Wt 145.2 lb

## 2012-05-15 DIAGNOSIS — J449 Chronic obstructive pulmonary disease, unspecified: Secondary | ICD-10-CM

## 2012-05-15 MED ORDER — OLMESARTAN MEDOXOMIL 20 MG PO TABS: 10.0000 mg | ORAL_TABLET | Freq: Every day | ORAL | Status: DC

## 2012-05-15 MED ORDER — OLMESARTAN MEDOXOMIL 20 MG PO TABS: 20.0000 mg | ORAL_TABLET | Freq: Every day | ORAL | Status: DC

## 2012-05-15 NOTE — Patient Instructions (Addendum)
Stop Lisinopril .  Begin Benicar 20mg  1/2 daily . IF blood pressure is >140 on consistent basis , call office  Remain on Prednisone 20mg  daily until breathing back to baseline and then taper to Prednisone 10mg  daily and hold  follow up Dr. Sherene Sires  In 2 -3 months and As needed Please contact office for sooner follow up if symptoms do not improve or worsen or seek emergency care

## 2012-05-15 NOTE — Assessment & Plan Note (Addendum)
Recurrent COPD exacerbations- no benefit w/ perforomist /brovana in past . Unable to get Albuterol from DME due to this rx.  No changes in nebs for now. Not candidate for daliresp for now -she is on mulitple medications and suspect nausea will be an issue If cont to have recurrent flare could consider in future .  Encouraged on pulmonary hygiene w/ IS/flutter, mucinex  Would avoid ACE inhibitors for this pt as she is prone to recurrent cough and wheezing.  Will stop ACE change to ARB Refer to PCP for b/p control  Note sent to PCP   Plan  Stop Lisinopril .  Begin Benicar 20mg  1/2 daily . IF blood pressure is >140 on consistent basis , call office  Remain on Prednisone 20mg  daily until breathing back to baseline and then taper to Prednisone 10mg  daily and hold  follow up Dr. Sherene Sires  In 2 -3 months and As needed Please contact office for sooner follow up if symptoms do not improve or worsen or seek emergency care    Plan

## 2012-05-15 NOTE — Progress Notes (Signed)
Subjective:    Patient ID: [name], female    DOB: [birth year], 79 y.o.   MRN: [id number]  HPI 62 yowf quit smoking 2002 with onset doe and a mild chronic cough. She subsequently gained 15 pounds and beginning about the middle of 2006 began to have exacerbations with coughing wheezing and shortness of breath which required short course of prednisone to get her back to baseline but requiring Prednisone daily since 3/09.   December 22, 2007 ov no decrease in ex tol albeit on oxygen at 24 hours a day and maintaining prednisone at 10 mg daily with gradual improvement in dyspnea.   March 16, 2010  c/o sharp pain substernally radiating back along lower rib cage, c/o dyspnea , no wheeze, no signs of cold - Not taking spiriva  Steroid dependent, takes pulmicort & duonebs  Told at [facility] that she has clogged arteries causing abdominal pain  EKG - nSR with RBBB& left bifascicular block - q waves in V1-2 related to BBB ? vs old MI   April 03, 2010 --Presents for follow up. Had fall 3 weeks ago. She lost her balance in closet 10/29 and fell forward. Had bilateral sharp rib pain w/ radiation into back. CXR w/ no acute changes. Seen at urgent care 2 weeks go, tx w/ advil. She is no better. Has sudden sharp pain in ribs bialterally w/ pain into back-catching pain w/ sudden movement. Denies chest pain, dyspnea, orthopnea, hemoptysis, fever, n/v/d, edema, headache. Her to breathe in at times. dx T 9 fx > Deveshwar eval, no rx.   April 20, 2010 ov cc sob x 3 weeks, sob at rest, no unusual cough/ congestion, does ok sleeping . Increased pred back to 20 mg per day with no benefit. no purulent sputum. back better, no leg c/ow just gen fatigue. Pt denies any significant sore throat, nasal congestion or excess secretions, fever, chills, sweats, unintended wt loss, pleuritic or exertional cp, orthopnea pnd or leg swelling.   Jan 2012 Hosp much better and 10 days of avelox >  Then cipro and able prednisone     08/22/10 Mid march prednisone up to 40 and another round of cipro >  Looser less yellow and pred down to 10 mg per day.  Confused with meds/ instructions and maint vs prn.   >>rx perforomoist and spiriva   09/19/10 Acute OV Complains of prod cough with yellow mucus (clearing), wheezing - states feels "about the same" even with doxy rx given 5.4.12.  daughter reports increased pred to 20mg  on 5.4.12. She is some better and mucus is mostly clear.Marland Kitchen She was at beach last week when cough got worse. Cant stop coughing at times. Worse at night. >>rec to finish doxyc, and steroid bump to 40mg  w/ taper to hold at 20mg    10/04/10 Follow up  2 week follow up. Last ov w/ AECOPD. She was tx with doxyc and steroid bump at 40mg  . She states wheezing and cough have improved, still having clear mucus production and weakness. Feels a lot better. Continues to have no energy.  No discolored mucus or fever. No increased swelling.  She is currently at 20mg  of prednsione daily   11/21/2010 Acute OV  Pt presents for an acute office visit. Complains of increased SOB, wheezing, prod cough with yellow/brown mucus x 4 days.  Pt is here with her daughter today. 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 She was having significant abdominal pain w/  n/v and weight loss over last several weeks -she is feeling so much better w/ improved appetite and resolution of abdominal pain. She started with cough and congestion after discharge. No fever or chest pain. No increased edema .  SHe is currently on prednisone 20mg  daily . She is now on plavix .Denies hemoptysis. >>Levaquin /steroid taper.   8/16//2012 Follow up  Returns for 1 month follow up . She has AECOPD last ov , tx w/ Levaquin and steroid taper . Says she is feeling better back to her baseline. Currently on pred 10mg  daily . No flare in cough or dyspnea. No leg swelling . No fever.  Says she is off Spiriva- messed with her eyes in past.   >>no changes   02/21/2011 Acute OV  Complains of cough , congestion , wheezing for 2 weeks. Took Cipro 7 days and steroid burst (  left over from daughter rx ). She got better but then cough returned 3 days ago, restarted Cipro 500mg  . Still has some cough , but much better. Currently on prednisone 10mg  daily . Better but not totally over symptoms. Daughter Alona Bene) is leaving to go out of town and would like her to be checked. No hemoptysis or edema  >>  05/28/2011 [facility]  Admitted December 27 2 05/18/2011 for acute COPD exacerbation, urinary tract infection, influenza, acute on chronic respiratory failure. The patient presented initially with acute confusion, hypoxia, and weakness, with fever of 103.5. She was started on Tamiflu. IV antibiotics and IV steroids. She was treated with nebulizer therapy. She was discharged on Keflex, which she has now completed. Urine culture showed Escherichia coli and Klebsiella UTI Since discharge. Patient continues to feel somewhat weak. However, her breathing has improved with decreased cough and congestion. She is currently on a prednisone taper at 30 mg and will be changing over to 20 mg tomorrow. Her daughter is with her today and is concerned that she continues to have a urinary tract infection. Because of complaints of intermittent dysuria and urgency. She denies any fever, chest pain, hemoptysis, or leg swelling.   08/21/2011 Acute OV / NP Patient presents for possible pneumonia. Pt c/o cough with thick green/yellow mucus, sob, wheezing, and weakness x 10 days Also night sweats.  Denies fever, chest pain and chest tightness. Took Doxycycline x  7 days by PCP , finished last dose yesterday  Not helping. No fever or leg swelling Good appetite. No urinary symptoms. Currently on prednisone 20mg  daily  rec Avelox 400mg  daily for 7 days -take with food-sample given  Mucinex DM Twice daily  As needed  Cough/congesiton  Continue on Prednisone 20mg  daily for  now   09/04/2011 f/u ov/Wert cc mucus still a bit yellow p 8/10 cipro with still thick mucus but breathing better and less need for daytime saba rescue.  N >>add perforomist   10/03/11 Follow up  4 week follow up COPD.  Over last 2 weeks less energy, has some wheezing.  More swelling in legs , weight is trending up .  No increased cough or discolored mucus. No fever.  Last ov perforomist added but she is unable to tolerate.  Has only been taking lasix 1 daily  >>lasix increased x 2 days then back to 60mg    BNP nml     11/27/2011 Follow up  Returns for 2 month follow up for COPD  had increased SOB "episode" 3 weeks ago, and tapered prednisone > better Back to her baseline now , on prednisone  10mg  daily  No fever or chest pain .  No increased leg swelling . No orthopnea   Very sedentary at home .  >no changes   02/19/12 Follow up  Pt returns with daughter for follow up for COPD  Dyspnea is at baseline Has good and bad days.  Has some  wheezing, some increased SOB, occ dry cough on/off.  No fever or edema.  Daughter is leaving to visit family for couple of weeks  No hemoptysis or chest pain.  >>no changes   05/15/2012 Follow up  2 month follow up COPD - Had recently COPD flare with 1 week ago with l SOB, wheezing .  Called in zpack and steroid burst.   still taking pred taper - on 2 tabs (20mg  ) currently. She is feeling some better . Has good and bad days.  No fever or purulent sputum. No edema. Wt is down.  Good appetite .   Had COPD flare w/ admit in 02/2012 , CXR w/ no acute process At discharge started on ACE inhibitor for HTN.        ROS:  Constitutional:   No  weight loss, night sweats,  Fevers, chills,  +fatigue, or  lassitude.  HEENT:   No headaches,  Difficulty swallowing,  Tooth/dental problems, or  Sore throat,                No sneezing, itching, ear ache, nasal congestion, post nasal drip,   CV:  No chest pain,  Orthopnea, PND,   anasarca, dizziness,  palpitations, syncope.   GI  No heartburn, indigestion, abdominal pain, nausea, vomiting, diarrhea, change in bowel habits, loss of appetite, bloody stools.   Resp:    No coughing up of blood.   No chest wall deformity  Skin: no rash or lesions.  GU: no dysuria, change in color of urine, no urgency or frequency.  No flank pain, no hematuria   MS:  No joint pain or swelling.  No decreased range of motion.  No back pain.  Psych:  No change in mood or affect. No depression or anxiety.  No memory loss.                   Objective:   Physical Exam  GEN: A/Ox3; pleasant , NAD, frail and elderly   HEENT:  [place]/AT,  EACs-clear, TMs-wnl, NOSE-clear, THROAT-clear, no lesions, no postnasal drip or exudate noted.   NECK:  Supple w/ fair ROM; no JVD; normal carotid impulses w/o bruits; no thyromegaly or nodules palpated; no lymphadenopathy.  RESP  Coarse BS w/ no wheezing   no accessory muscle use, no dullness to percussion Upper airway pseudowheeze on forced expiration.  CARD:  RRR, no m/r/g  ,no  peripheral edema, pulses intact, no cyanosis or clubbing.  GI:   Soft & nt; nml bowel sounds; no organomegaly or masses detected.   Musco: Warm bil, no deformities or joint swelling noted.   Neuro: alert, no focal deficits noted.    Skin: Warm, no lesions or rashes          Assessment & Plan:

## 2012-05-21 ENCOUNTER — Telehealth: Payer: Self-pay | Admitting: Internal Medicine

## 2012-05-21 NOTE — Telephone Encounter (Signed)
Called and spoke with pt She states having some increased SOB and wheezing today I advised needs ov for eval Offered ov with MW for tomorrow am, but she did not want to come in b/c time was too early Pt scheduled to see TP at 3 pm tomorrow  I have advised her to seek emergent care sooner if needed and she verbalized understanding

## 2012-05-22 ENCOUNTER — Telehealth: Payer: Self-pay | Admitting: Adult Health

## 2012-05-22 ENCOUNTER — Ambulatory Visit: Payer: Medicare Other | Admitting: Adult Health

## 2012-05-22 NOTE — Telephone Encounter (Signed)
I spoke with daughter. She cancelled pt appt for today with TP. Pt is feeling better today. SOB is some better today. She made an appt to see MW tomorrow just in case she worsens overnight. She will cancel tomorrow's appt if she is better although (just wanted an appt on the books). Nothing further was needed. She will monitor pt overnight.

## 2012-05-23 ENCOUNTER — Ambulatory Visit: Payer: Medicare Other | Admitting: Internal Medicine

## 2012-05-26 ENCOUNTER — Telehealth: Payer: Self-pay | Admitting: Internal Medicine

## 2012-05-26 ENCOUNTER — Ambulatory Visit: Payer: Medicare Other | Admitting: Adult Health

## 2012-05-26 MED ORDER — CEFDINIR 300 MG PO CAPS: 300.0000 mg | ORAL_CAPSULE | Freq: Two times a day (BID) | ORAL | Status: DC

## 2012-05-26 NOTE — Telephone Encounter (Signed)
Pt just saw TP 1.2.14.   Called spoke with Alona Bene > pt c/o increased SOB, prod cough/congestion but doesn't know the color of the mucus, wheezing.  Alona Bene increased pt's prednisone back up to 4 tabs yesterday.  Denies f/c/s.  SATS staying in the mid- to upper 90s. Taking mucinex max 1200mg  BID; taking all pulmonary meds. Verified that pt stopped the lisinopril and began the benicar as directed. Alona Bene is afraid that leaving this untreated will cause pt to go into distress, resulting in an admission but is afraid to bring her into the office for OV b/c she doesn't want her "exposed".  TP please advise, thanks.

## 2012-05-26 NOTE — Telephone Encounter (Signed)
Called, spoke with pt's daughter, Alona Bene.  Informed her of below per TP.  She verbalized understanding of this, is aware ocmicef rx sent to [facility], and voiced no further questions or concerns at this time.  Alona Bene was advise to call back if symptoms do not improve or worsen and seek emergency care if needed.

## 2012-05-26 NOTE — Telephone Encounter (Signed)
Spoke with daughter pt has appt today with TP today but afraid to come  In and get something worse. Pt still having chest congestion ,productive  Cough, and some wheezing. Pt is requesting rx to be called in. And states  If no better will make appt to come in an be seen. . Allergies  Allergen Reactions  . Levofloxacin     Unable to sleep, pt stated that she felt crazy--only took for 3 days  . Statins   . Sulfonamide Derivatives   . Penicillins Rash   Dr Sherene Sires Please advise. Thank you

## 2012-05-26 NOTE — Telephone Encounter (Signed)
the cough related to bp meds will last up to a month to resolve and she already has multiple abx allergies listed, needs ov with me or Tammy before additional abx should be tried.

## 2012-05-26 NOTE — Telephone Encounter (Signed)
Per daughter [name] does nothing for pt and pt had this about a week ao. She is something stronger for pt. Please advise TP thanks  Allergies  Allergen Reactions  . Levofloxacin     Unable to sleep, pt stated that she felt crazy--only took for 3 days  . Statins   . Sulfonamide Derivatives   . Penicillins Rash

## 2012-05-26 NOTE — Telephone Encounter (Signed)
Zpack take as directed #1 , no refills  mucinex As needed  Cough /congestion Please contact office for sooner follow up if symptoms do not improve or worsen or seek emergency care

## 2012-05-26 NOTE — Telephone Encounter (Signed)
Not sure what to use for her as she has multiple drug intolerances -please let her know that we only have a few limited choices due to her allergies  Will try Omnicef 300mg  Twice daily  #14 no refills  Please contact office for sooner follow up if symptoms do not improve or worsen or seek emergency care

## 2012-05-27 ENCOUNTER — Telehealth: Payer: Self-pay | Admitting: Internal Medicine

## 2012-05-27 NOTE — Telephone Encounter (Signed)
Advised needs to go to ER ,  Daughter feels she is improving w/ benadryl  Will bring to office tomorrow for ov at 10 am  Please contact office for sooner follow up if symptoms do not improve or worsen or seek emergency care

## 2012-05-27 NOTE — Telephone Encounter (Signed)
Pt took one of the Cefdinir and broke out in hives and became SOB. The daughter says she is going to give the pt Benadryl to help with the allergic symptoms. She would like to know if TP can prescribe either Cipro or Doxycycline instead. Also give any additional recs. Cefdinir has been added to the allergy list. Pt will seek emergency help if her breathing becomes worse. Allergies  Allergen Reactions  . Cefdinir Hives and Shortness Of Breath  . Levofloxacin     Unable to sleep, pt stated that she felt crazy--only took for 3 days  . Statins   . Sulfonamide Derivatives   . Penicillins Rash

## 2012-05-28 ENCOUNTER — Ambulatory Visit (INDEPENDENT_AMBULATORY_CARE_PROVIDER_SITE_OTHER)
Admission: RE | Admit: 2012-05-28 | Discharge: 2012-05-28 | Disposition: A | Discharge status: Home / Self Care | Payer: Medicare Other | Source: Ambulatory Visit | Attending: Adult Health | Admitting: Adult Health

## 2012-05-28 ENCOUNTER — Encounter: Payer: Self-pay | Admitting: Adult Health

## 2012-05-28 ENCOUNTER — Ambulatory Visit (INDEPENDENT_AMBULATORY_CARE_PROVIDER_SITE_OTHER): Payer: Medicare Other | Admitting: Adult Health

## 2012-05-28 VITALS — BP 122/60 | HR 72 | Temp 97.7°F

## 2012-05-28 DIAGNOSIS — J449 Chronic obstructive pulmonary disease, unspecified: Secondary | ICD-10-CM

## 2012-05-28 DIAGNOSIS — J441 Chronic obstructive pulmonary disease with (acute) exacerbation: Secondary | ICD-10-CM

## 2012-05-28 DIAGNOSIS — J4489 Other specified chronic obstructive pulmonary disease: Secondary | ICD-10-CM

## 2012-05-28 NOTE — Patient Instructions (Addendum)
No further antibiotics at this time.   Mucinex DM Twice daily  As needed  Cough/congestion  Fluids and rest  follow up 2-3  months as planned and As needed   Please contact office for sooner follow up if symptoms do not improve or worsen or seek emergency care

## 2012-05-28 NOTE — Progress Notes (Signed)
Subjective:    Patient ID: [name], female    DOB: 07-17-1923, 79 y.o.   MRN: [id number]  HPI 60 yowf quit smoking 2002 with onset doe and a mild chronic cough. She subsequently gained 15 pounds and beginning about the middle of 2006 began to have exacerbations with coughing wheezing and shortness of breath which required short course of prednisone to get her back to baseline but requiring Prednisone daily since 3/09.   December 22, 2007 ov no decrease in ex tol albeit on oxygen at 24 hours a day and maintaining prednisone at 10 mg daily with gradual improvement in dyspnea.   March 16, 2010  c/o sharp pain substernally radiating back along lower rib cage, c/o dyspnea , no wheeze, no signs of cold - Not taking spiriva  Steroid dependent, takes pulmicort & duonebs  Told at [facility] that she has clogged arteries causing abdominal pain  EKG - nSR with RBBB& left bifascicular block - q waves in V1-2 related to BBB ? vs old MI   April 03, 2010 --Presents for follow up. Had fall 3 weeks ago. She lost her balance in closet 10/29 and fell forward. Had bilateral sharp rib pain w/ radiation into back. CXR w/ no acute changes. Seen at urgent care 2 weeks go, tx w/ advil. She is no better. Has sudden sharp pain in ribs bialterally w/ pain into back-catching pain w/ sudden movement. Denies chest pain, dyspnea, orthopnea, hemoptysis, fever, n/v/d, edema, headache. Her to breathe in at times. dx T 9 fx > Deveshwar eval, no rx.   April 20, 2010 ov cc sob x 3 weeks, sob at rest, no unusual cough/ congestion, does ok sleeping . Increased pred back to 20 mg per day with no benefit. no purulent sputum. back better, no leg c/ow just gen fatigue. Pt denies any significant sore throat, nasal congestion or excess secretions, fever, chills, sweats, unintended wt loss, pleuritic or exertional cp, orthopnea pnd or leg swelling.   Jan 2012 Hosp much better and 10 days of avelox >  Then cipro and able prednisone     08/22/10 Mid march prednisone up to 40 and another round of cipro >  Looser less yellow and pred down to 10 mg per day.  Confused with meds/ instructions and maint vs prn.   >>rx perforomoist and spiriva   09/19/10 Acute OV Complains of prod cough with yellow mucus (clearing), wheezing - states feels "about the same" even with doxy rx given 5.4.12.  Michelle reports increased pred to 20mg  on 5.4.12. She is some better and mucus is mostly clear.Marland Kitchen She was at beach last week when cough got worse. Cant stop coughing at times. Worse at night. >>rec to finish doxyc, and steroid bump to 40mg  w/ taper to hold at 20mg    10/04/10 Follow up  2 week follow up. Last ov w/ AECOPD. She was tx with doxyc and steroid bump at 40mg  . She states wheezing and cough have improved, still having clear mucus production and weakness. Feels a lot better. Continues to have no energy.  No discolored mucus or fever. No increased swelling.  She is currently at 20mg  of prednsione daily   11/21/2010 Acute OV  Pt presents for an acute office visit. Complains of increased SOB, wheezing, prod cough with yellow/brown mucus x 4 days.  Pt is here with her Michelle today. 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 She was having significant abdominal pain w/  n/v and weight loss over last several weeks -she is feeling so much better w/ improved appetite and resolution of abdominal pain. She started with cough and congestion after discharge. No fever or chest pain. No increased edema .  SHe is currently on prednisone 20mg  daily . She is now on plavix .Denies hemoptysis. >>Levaquin /steroid taper.   8/16//2012 Follow up  Returns for 1 month follow up . She has AECOPD last ov , tx w/ Levaquin and steroid taper . Says she is feeling better back to her baseline. Currently on pred 10mg  daily . No flare in cough or dyspnea. No leg swelling . No fever.  Says she is off Spiriva- messed with her eyes in past.   >>no changes   02/21/2011 Acute OV  Complains of cough , congestion , wheezing for 2 weeks. Took Cipro 7 days and steroid burst (  left over from Michelle rx ). She got better but then cough returned 3 days ago, restarted Cipro 500mg  . Still has some cough , but much better. Currently on prednisone 10mg  daily . Better but not totally over symptoms. [name]) is leaving to go out of town and would like her to be checked. No hemoptysis or edema  >>  05/28/2011 [facility]  Admitted December 27 2 05/18/2011 for acute COPD exacerbation, urinary tract infection, influenza, acute on chronic respiratory failure. The patient presented initially with acute confusion, hypoxia, and weakness, with fever of 103.5. She was started on Tamiflu. IV antibiotics and IV steroids. She was treated with nebulizer therapy. She was discharged on Keflex, which she has now completed. Urine culture showed Escherichia coli and Klebsiella UTI Since discharge. Patient continues to feel somewhat weak. However, her breathing has improved with decreased cough and congestion. She is currently on a prednisone taper at 30 mg and will be changing over to 20 mg tomorrow. Her Michelle is with her today and is concerned that she continues to have a urinary tract infection. Because of complaints of intermittent dysuria and urgency. She denies any fever, chest pain, hemoptysis, or leg swelling.   08/21/2011 Acute OV / NP Patient presents for possible pneumonia. Pt c/o cough with thick green/yellow mucus, sob, wheezing, and weakness x 10 days Also night sweats.  Denies fever, chest pain and chest tightness. Took Doxycycline x  7 days by PCP , finished last dose yesterday  Not helping. No fever or leg swelling Good appetite. No urinary symptoms. Currently on prednisone 20mg  daily  rec Avelox 400mg  daily for 7 days -take with food-sample given  Mucinex DM Twice daily  As needed  Cough/congesiton  Continue on Prednisone 20mg  daily for  now   09/04/2011 f/u ov/Wert cc mucus still a bit yellow p 8/10 cipro with still thick mucus but breathing better and less need for daytime saba rescue.  N >>add perforomist   10/03/11 Follow up  4 week follow up COPD.  Over last 2 weeks less energy, has some wheezing.  More swelling in legs , weight is trending up .  No increased cough or discolored mucus. No fever.  Last ov perforomist added but she is unable to tolerate.  Has only been taking lasix 1 daily  >>lasix increased x 2 days then back to 60mg    BNP nml     11/27/2011 Follow up  Returns for 2 month follow up for COPD  had increased SOB "episode" 3 weeks ago, and tapered prednisone > better Back to her baseline now , on prednisone  10mg  daily  No fever or chest pain .  No increased leg swelling . No orthopnea   Very sedentary at home .  >no changes   02/19/12 Follow up  Pt returns with Michelle for follow up for COPD  Dyspnea is at baseline Has good and bad days.  Has some  wheezing, some increased SOB, occ dry cough on/off.  No fever or edema.  Michelle is leaving to visit family for couple of weeks  No hemoptysis or chest pain.  >>no changes   05/15/2012 Follow up  2 month follow up COPD - Had recently COPD flare with 1 week ago with l SOB, wheezing .  Called in zpack and steroid burst.   still taking pred taper - on 2 tabs (20mg  ) currently. She is feeling some better . Has good and bad days.  No fever or purulent sputum. No edema. Wt is down.  Good appetite .   Had COPD flare w/ admit in 02/2012 , CXR w/ no acute process At discharge started on ACE inhibitor for HTN.   >d/c ACE inhibitor   05/28/2012 Acute OV  Complains of full-body hives and some increased SOB after first dose of omnicef.  hives resoved w/ benadryl.  still having respiratory symptoms w/ cough, congestion, clear mucus, wheezing.  has been taking 4 tabs prednisone x4days.  Pt feels she is better. No lip or oral swelling.  No fever or discolored  mucus.  She had called into office w/ increased cough and congestion , called in omnicef .  Had reaction shortly after taking w/ hives and perioribital redness.      ROS:  Constitutional:   No  weight loss, night sweats,  Fevers, chills,  +fatigue, or  lassitude.  HEENT:   No headaches,  Difficulty swallowing,  Tooth/dental problems, or  Sore throat,                No sneezing, itching, ear ache,  +nasal congestion, post nasal drip,   CV:  No chest pain,  Orthopnea, PND,   anasarca, dizziness, palpitations, syncope.   GI  No heartburn, indigestion, abdominal pain, nausea, vomiting, diarrhea, change in bowel habits, loss of appetite, bloody stools.   Resp:    No coughing up of blood.   No chest wall deformity  Skin: no rash or lesions.  GU: no dysuria, change in color of urine, no urgency or frequency.  No flank pain, no hematuria   MS:  No joint pain or swelling.  No decreased range of motion.  No back pain.  Psych:  No change in mood or affect. No depression or anxiety.  No memory loss.                   Objective:   Physical Exam  GEN: A/Ox3; pleasant , NAD, frail and elderly   HEENT:  [place]/AT,  EACs-clear, TMs-wnl, NOSE-clear, THROAT-clear, no lesions, no postnasal drip or exudate noted.   NECK:  Supple w/ fair ROM; no JVD; normal carotid impulses w/o bruits; no thyromegaly or nodules palpated; no lymphadenopathy.  RESP  Coarse BS w/ no wheezing   no accessory muscle use, no dullness to percussion Upper airway pseudowheeze on forced expiration.  CARD:  RRR, no m/r/g  ,no  peripheral edema, pulses intact, no cyanosis or clubbing.  GI:   Soft & nt; nml bowel sounds; no organomegaly or masses detected.   Musco: Warm bil, no deformities or joint swelling noted.   Neuro: alert,  no focal deficits noted.    Skin: Warm, no lesions or rashes- no evidence of hives          Assessment & Plan:

## 2012-05-28 NOTE — Assessment & Plan Note (Signed)
Reason slow to resolve, exacerbation, complicated by ACE inhibitor with upper airway cough. Patient with antibiotic, reaction with hives. This resolved with Benadryl [name] has been placed on her allergy list. In the future will ward off, penicillin and cephalosporin antibiotics. Patient appears to be much improved For any additional antibiotics at this time. Check chest x-ray today. Advised patient and daughter that cough and upper airway wheezing. May still be remnants of her ACE inhibitor Causing upper airway i irritability  she will taper prednisone back down to baseline Patient is to follow back in 2-3 months and as needed

## 2012-05-29 NOTE — Progress Notes (Signed)
Quick Note:  Called spoke with patient, advised of cxr results / recs as stated by TP. Pt verbalized her understanding and denied any questions. ______ 

## 2012-06-03 ENCOUNTER — Telehealth: Payer: Self-pay | Admitting: Adult Health

## 2012-06-03 DIAGNOSIS — J449 Chronic obstructive pulmonary disease, unspecified: Secondary | ICD-10-CM

## 2012-06-03 NOTE — Telephone Encounter (Signed)
I spoke with Alona Bene and she is wanting to know if TP thinks it is time for hospice to come in and how much time does TP think pt may have with her "end stage emphysema". Please advise TP thanks

## 2012-06-03 NOTE — Telephone Encounter (Signed)
Please send this to Dr. Sherene Sires  ,  To see if he sign off on hospice  But yes I do think it is time.

## 2012-06-03 NOTE — Telephone Encounter (Signed)
Ok for hospice 

## 2012-06-04 ENCOUNTER — Telehealth: Payer: Self-pay | Admitting: Internal Medicine

## 2012-06-04 NOTE — Telephone Encounter (Signed)
Hospice referral daughter to call them asap [name]

## 2012-06-04 NOTE — Telephone Encounter (Signed)
Spoke with Julieanne Cotton and notified of recs per MW She verbalized understanding and nothing further needed

## 2012-06-04 NOTE — Telephone Encounter (Signed)
Daughter is aware. She stated she will contact hospice. She does not want hospice contacting them. Please advise PCC's thanks

## 2012-06-04 NOTE — Telephone Encounter (Signed)
MW not in office this afternoon.  Called spoke with [facility] and informed her that if not today, we will contact her tomorrow w/ MW's response.  Dr Sherene Sires please advise, thanks.

## 2012-06-04 NOTE — Telephone Encounter (Signed)
Yes I can be attending if need be but we have [place] in our recs as primary so he should have the right of first refusal and ok to get hospice doc to see also

## 2012-06-06 ENCOUNTER — Telehealth: Payer: Self-pay | Admitting: Internal Medicine

## 2012-06-06 NOTE — Telephone Encounter (Signed)
Spoke with Larita Fife from Hospice, would like to know if patient has had a PFT done in past 2 yrs.   I have looked through our system as well as spoke with Marcelino Duster in Resp at [facility] and there is no PFt done at all. Informed Larita Fife of this, verbalized understnding and nothing further needed at this time.

## 2012-06-16 ENCOUNTER — Telehealth: Payer: Self-pay | Admitting: Internal Medicine

## 2012-06-16 MED ORDER — ALBUTEROL SULFATE HFA 108 (90 BASE) MCG/ACT IN AERS: 2.0000 | INHALATION_SPRAY | RESPIRATORY_TRACT | Status: AC | PRN

## 2012-06-16 NOTE — Telephone Encounter (Signed)
I spoke with the pt daughter and she states they need rx for albuterol inhaler sent to [facility] instead of the mail order.  Rx for proventil sent. [name], CMA

## 2012-07-18 ENCOUNTER — Emergency Department (HOSPITAL_COMMUNITY)

## 2012-07-18 ENCOUNTER — Inpatient Hospital Stay (HOSPITAL_COMMUNITY)
Admission: EM | Admit: 2012-07-18 | Discharge: 2012-07-22 | DRG: 190 | Disposition: A | Discharge status: Hospice | Attending: Internal Medicine | Admitting: Internal Medicine

## 2012-07-18 ENCOUNTER — Encounter (HOSPITAL_COMMUNITY): Payer: Self-pay | Admitting: *Deleted

## 2012-07-18 DIAGNOSIS — R141 Gas pain: Secondary | ICD-10-CM | POA: Diagnosis present

## 2012-07-18 DIAGNOSIS — Z87891 Personal history of nicotine dependence: Secondary | ICD-10-CM

## 2012-07-18 DIAGNOSIS — IMO0002 Reserved for concepts with insufficient information to code with codable children: Secondary | ICD-10-CM

## 2012-07-18 DIAGNOSIS — Z7982 Long term (current) use of aspirin: Secondary | ICD-10-CM

## 2012-07-18 DIAGNOSIS — Z794 Long term (current) use of insulin: Secondary | ICD-10-CM

## 2012-07-18 DIAGNOSIS — K219 Gastro-esophageal reflux disease without esophagitis: Secondary | ICD-10-CM | POA: Diagnosis present

## 2012-07-18 DIAGNOSIS — Z882 Allergy status to sulfonamides status: Secondary | ICD-10-CM

## 2012-07-18 DIAGNOSIS — J449 Chronic obstructive pulmonary disease, unspecified: Secondary | ICD-10-CM

## 2012-07-18 DIAGNOSIS — J9600 Acute respiratory failure, unspecified whether with hypoxia or hypercapnia: Secondary | ICD-10-CM | POA: Diagnosis present

## 2012-07-18 DIAGNOSIS — R7309 Other abnormal glucose: Secondary | ICD-10-CM

## 2012-07-18 DIAGNOSIS — E119 Type 2 diabetes mellitus without complications: Secondary | ICD-10-CM | POA: Diagnosis present

## 2012-07-18 DIAGNOSIS — E039 Hypothyroidism, unspecified: Secondary | ICD-10-CM | POA: Diagnosis present

## 2012-07-18 DIAGNOSIS — R338 Other retention of urine: Secondary | ICD-10-CM | POA: Diagnosis present

## 2012-07-18 DIAGNOSIS — N390 Urinary tract infection, site not specified: Secondary | ICD-10-CM

## 2012-07-18 DIAGNOSIS — Z6828 Body mass index (BMI) 28.0-28.9, adult: Secondary | ICD-10-CM

## 2012-07-18 DIAGNOSIS — Z79899 Other long term (current) drug therapy: Secondary | ICD-10-CM

## 2012-07-18 DIAGNOSIS — T4275XA Adverse effect of unspecified antiepileptic and sedative-hypnotic drugs, initial encounter: Secondary | ICD-10-CM | POA: Diagnosis present

## 2012-07-18 DIAGNOSIS — Z880 Allergy status to penicillin: Secondary | ICD-10-CM

## 2012-07-18 DIAGNOSIS — I10 Essential (primary) hypertension: Secondary | ICD-10-CM | POA: Diagnosis present

## 2012-07-18 DIAGNOSIS — R5381 Other malaise: Secondary | ICD-10-CM | POA: Diagnosis present

## 2012-07-18 DIAGNOSIS — E785 Hyperlipidemia, unspecified: Secondary | ICD-10-CM | POA: Diagnosis present

## 2012-07-18 DIAGNOSIS — Z66 Do not resuscitate: Secondary | ICD-10-CM | POA: Diagnosis present

## 2012-07-18 DIAGNOSIS — Z7902 Long term (current) use of antithrombotics/antiplatelets: Secondary | ICD-10-CM

## 2012-07-18 DIAGNOSIS — Z881 Allergy status to other antibiotic agents status: Secondary | ICD-10-CM

## 2012-07-18 DIAGNOSIS — J9610 Chronic respiratory failure, unspecified whether with hypoxia or hypercapnia: Secondary | ICD-10-CM

## 2012-07-18 DIAGNOSIS — J441 Chronic obstructive pulmonary disease with (acute) exacerbation: Principal | ICD-10-CM | POA: Diagnosis present

## 2012-07-18 DIAGNOSIS — Z888 Allergy status to other drugs, medicaments and biological substances status: Secondary | ICD-10-CM

## 2012-07-18 DIAGNOSIS — M810 Age-related osteoporosis without current pathological fracture: Secondary | ICD-10-CM | POA: Diagnosis present

## 2012-07-18 DIAGNOSIS — E669 Obesity, unspecified: Secondary | ICD-10-CM | POA: Diagnosis present

## 2012-07-18 DIAGNOSIS — Y92009 Unspecified place in unspecified non-institutional (private) residence as the place of occurrence of the external cause: Secondary | ICD-10-CM

## 2012-07-18 DIAGNOSIS — J9620 Acute and chronic respiratory failure, unspecified whether with hypoxia or hypercapnia: Secondary | ICD-10-CM | POA: Diagnosis present

## 2012-07-18 DIAGNOSIS — Z9089 Acquired absence of other organs: Secondary | ICD-10-CM

## 2012-07-18 DIAGNOSIS — E860 Dehydration: Secondary | ICD-10-CM | POA: Diagnosis present

## 2012-07-18 DIAGNOSIS — Z9981 Dependence on supplemental oxygen: Secondary | ICD-10-CM

## 2012-07-18 DIAGNOSIS — R142 Eructation: Secondary | ICD-10-CM | POA: Diagnosis present

## 2012-07-18 LAB — POCT I-STAT, CHEM 8
BUN: 37 mg/dL — ABNORMAL HIGH (ref 6–23)
Calcium, Ion: 1.14 mmol/L (ref 1.13–1.30)
Creatinine, Ser: 1.3 mg/dL — ABNORMAL HIGH (ref 0.50–1.10)
Hemoglobin: 11.9 g/dL — ABNORMAL LOW (ref 12.0–15.0)
TCO2: 31 mmol/L (ref 0–100)

## 2012-07-18 LAB — CBC WITH DIFFERENTIAL/PLATELET
Eosinophils Absolute: 0 10*3/uL (ref 0.0–0.7)
Eosinophils Relative: 0 % (ref 0–5)
HCT: 35.3 % — ABNORMAL LOW (ref 36.0–46.0)
Lymphocytes Relative: 10 % — ABNORMAL LOW (ref 12–46)
Lymphs Abs: 1.1 10*3/uL (ref 0.7–4.0)
MCH: 32.1 pg (ref 26.0–34.0)
MCV: 94.4 fL (ref 78.0–100.0)
Monocytes Absolute: 0.4 10*3/uL (ref 0.1–1.0)
RBC: 3.74 MIL/uL — ABNORMAL LOW (ref 3.87–5.11)
RDW: 13.4 % (ref 11.5–15.5)
WBC: 10.3 10*3/uL (ref 4.0–10.5)

## 2012-07-18 LAB — URINALYSIS, MICROSCOPIC ONLY
Bilirubin Urine: NEGATIVE
Glucose, UA: NEGATIVE mg/dL
Hgb urine dipstick: NEGATIVE
Ketones, ur: NEGATIVE mg/dL
Protein, ur: NEGATIVE mg/dL

## 2012-07-18 MED ORDER — GUAIFENESIN ER 600 MG PO TB12
1200.0000 mg | ORAL_TABLET | Freq: Two times a day (BID) | ORAL | Status: DC | PRN
  Filled 2012-07-18: qty 2

## 2012-07-18 MED ORDER — ALBUTEROL SULFATE (5 MG/ML) 0.5% IN NEBU
5.0000 mg | INHALATION_SOLUTION | Freq: Once | RESPIRATORY_TRACT | Status: AC
  Administered 2012-07-18: 5 mg via RESPIRATORY_TRACT
  Filled 2012-07-18: qty 1

## 2012-07-18 MED ORDER — FERROUS SULFATE 325 (65 FE) MG PO TBEC
325.0000 mg | DELAYED_RELEASE_TABLET | Freq: Every day | ORAL | Status: DC
  Administered 2012-07-19 – 2012-07-22 (×4): 325 mg via ORAL
  Filled 2012-07-18 (×5): qty 1

## 2012-07-18 MED ORDER — INSULIN ASPART 100 UNIT/ML ~~LOC~~ SOLN
0.0000 [IU] | Freq: Three times a day (TID) | SUBCUTANEOUS | Status: DC
  Administered 2012-07-19: 3 [IU] via SUBCUTANEOUS
  Administered 2012-07-19: 7 [IU] via SUBCUTANEOUS
  Administered 2012-07-19: 5 [IU] via SUBCUTANEOUS
  Administered 2012-07-20: 09:00:00 via SUBCUTANEOUS

## 2012-07-18 MED ORDER — ACETAMINOPHEN 325 MG PO TABS: 650.0000 mg | ORAL_TABLET | Freq: Four times a day (QID) | ORAL | Status: DC | PRN

## 2012-07-18 MED ORDER — SODIUM CHLORIDE 0.9 % IV SOLN
INTRAVENOUS | Status: DC
  Administered 2012-07-19: 05:00:00 via INTRAVENOUS

## 2012-07-18 MED ORDER — ONDANSETRON HCL 4 MG/2ML IJ SOLN: 4.0000 mg | Freq: Four times a day (QID) | INTRAMUSCULAR | Status: DC | PRN

## 2012-07-18 MED ORDER — IPRATROPIUM BROMIDE 0.02 % IN SOLN
0.5000 mg | Freq: Once | RESPIRATORY_TRACT | Status: AC
  Administered 2012-07-18: 0.5 mg via RESPIRATORY_TRACT
  Filled 2012-07-18: qty 2.5

## 2012-07-18 MED ORDER — ASPIRIN EC 81 MG PO TBEC
81.0000 mg | DELAYED_RELEASE_TABLET | Freq: Every day | ORAL | Status: DC
  Administered 2012-07-19 – 2012-07-22 (×4): 81 mg via ORAL
  Filled 2012-07-18 (×5): qty 1

## 2012-07-18 MED ORDER — METHYLPREDNISOLONE SODIUM SUCC 125 MG IJ SOLR
80.0000 mg | Freq: Two times a day (BID) | INTRAMUSCULAR | Status: DC
Start: 2012-07-19 — End: 2012-07-19
  Administered 2012-07-19: 80 mg via INTRAVENOUS
  Filled 2012-07-18 (×2): qty 1.28

## 2012-07-18 MED ORDER — MAGNESIUM OXIDE 400 (241.3 MG) MG PO TABS
200.0000 mg | ORAL_TABLET | Freq: Every day | ORAL | Status: DC
  Administered 2012-07-18: 200 mg via ORAL
  Administered 2012-07-19 – 2012-07-20 (×2): 100 mg via ORAL
  Administered 2012-07-21 – 2012-07-22 (×2): 200 mg via ORAL
  Filled 2012-07-18 (×5): qty 0.5

## 2012-07-18 MED ORDER — VITAMIN C 500 MG PO TABS
1000.0000 mg | ORAL_TABLET | Freq: Every day | ORAL | Status: DC
  Administered 2012-07-19 – 2012-07-22 (×4): 1000 mg via ORAL
  Filled 2012-07-18 (×5): qty 2

## 2012-07-18 MED ORDER — IPRATROPIUM BROMIDE 0.02 % IN SOLN
0.5000 mg | RESPIRATORY_TRACT | Status: DC
  Administered 2012-07-19 – 2012-07-20 (×10): 0.5 mg via RESPIRATORY_TRACT
  Filled 2012-07-18 (×10): qty 2.5

## 2012-07-18 MED ORDER — MAGNESIUM 250 MG PO TABS: 1.0000 | ORAL_TABLET | Freq: Every day | ORAL | Status: DC

## 2012-07-18 MED ORDER — SERTRALINE HCL 50 MG PO TABS
50.0000 mg | ORAL_TABLET | Freq: Every day | ORAL | Status: DC
  Administered 2012-07-19 – 2012-07-22 (×4): 50 mg via ORAL
  Filled 2012-07-18 (×6): qty 1

## 2012-07-18 MED ORDER — ENOXAPARIN SODIUM 30 MG/0.3ML ~~LOC~~ SOLN
30.0000 mg | SUBCUTANEOUS | Status: DC
  Administered 2012-07-18: 30 mg via SUBCUTANEOUS
  Filled 2012-07-18 (×2): qty 0.3

## 2012-07-18 MED ORDER — INSULIN ASPART PROT & ASPART (70-30 MIX) 100 UNIT/ML ~~LOC~~ SUSP
10.0000 [IU] | Freq: Every day | SUBCUTANEOUS | Status: DC
  Administered 2012-07-19 – 2012-07-21 (×3): 10 [IU] via SUBCUTANEOUS
  Filled 2012-07-18: qty 10

## 2012-07-18 MED ORDER — ALBUTEROL SULFATE (5 MG/ML) 0.5% IN NEBU
2.5000 mg | INHALATION_SOLUTION | RESPIRATORY_TRACT | Status: DC
  Administered 2012-07-19 – 2012-07-20 (×10): 2.5 mg via RESPIRATORY_TRACT
  Filled 2012-07-18 (×11): qty 0.5

## 2012-07-18 MED ORDER — CALCIUM CITRATE-VITAMIN D 315-200 MG-UNIT PO TABS
2.0000 | ORAL_TABLET | Freq: Every day | ORAL | Status: DC
Start: 2012-07-18 — End: 2012-07-18

## 2012-07-18 MED ORDER — ACETAMINOPHEN 650 MG RE SUPP: 650.0000 mg | Freq: Four times a day (QID) | RECTAL | Status: DC | PRN

## 2012-07-18 MED ORDER — ZOLPIDEM TARTRATE 5 MG PO TABS: 5.0000 mg | ORAL_TABLET | Freq: Every evening | ORAL | Status: DC | PRN

## 2012-07-18 MED ORDER — SENNOSIDES 8.6 MG PO TABS
2.0000 | ORAL_TABLET | Freq: Two times a day (BID) | ORAL | Status: DC
  Administered 2012-07-18 – 2012-07-20 (×4): 17.2 mg via ORAL
  Filled 2012-07-18 (×5): qty 2

## 2012-07-18 MED ORDER — METHYLPREDNISOLONE SODIUM SUCC 125 MG IJ SOLR
125.0000 mg | Freq: Once | INTRAMUSCULAR | Status: AC
  Administered 2012-07-18: 125 mg via INTRAVENOUS
  Filled 2012-07-18: qty 2

## 2012-07-18 MED ORDER — SALINE NASAL SPRAY 0.65 % NA SOLN
1.0000 | NASAL | Status: DC | PRN
  Filled 2012-07-18: qty 0.5

## 2012-07-18 MED ORDER — METHYLPREDNISOLONE SODIUM SUCC 125 MG IJ SOLR: 125.0000 mg | Freq: Once | INTRAMUSCULAR | Status: DC

## 2012-07-18 MED ORDER — CLOPIDOGREL BISULFATE 75 MG PO TABS
75.0000 mg | ORAL_TABLET | Freq: Every day | ORAL | Status: DC
  Administered 2012-07-19 – 2012-07-22 (×4): 75 mg via ORAL
  Filled 2012-07-18 (×5): qty 1

## 2012-07-18 MED ORDER — PANTOPRAZOLE SODIUM 40 MG PO TBEC
40.0000 mg | DELAYED_RELEASE_TABLET | Freq: Every day | ORAL | Status: DC
  Administered 2012-07-19 – 2012-07-22 (×4): 40 mg via ORAL
  Filled 2012-07-18 (×4): qty 1

## 2012-07-18 MED ORDER — INSULIN ASPART PROT & ASPART (70-30 MIX) 100 UNIT/ML ~~LOC~~ SUSP
30.0000 [IU] | Freq: Every day | SUBCUTANEOUS | Status: AC
  Administered 2012-07-19: 30 [IU] via SUBCUTANEOUS
  Filled 2012-07-18: qty 10

## 2012-07-18 MED ORDER — INSULIN ASPART 100 UNIT/ML ~~LOC~~ SOLN: 0.0000 [IU] | Freq: Every day | SUBCUTANEOUS | Status: DC

## 2012-07-18 MED ORDER — VITAMIN D3 25 MCG (1000 UNIT) PO TABS
2000.0000 [IU] | ORAL_TABLET | Freq: Two times a day (BID) | ORAL | Status: DC
  Administered 2012-07-18 – 2012-07-22 (×8): 2000 [IU] via ORAL
  Filled 2012-07-18 (×9): qty 2

## 2012-07-18 MED ORDER — SODIUM CHLORIDE 0.9 % IV BOLUS (SEPSIS)
500.0000 mL | Freq: Once | INTRAVENOUS | Status: AC
  Administered 2012-07-18: 500 mL via INTRAVENOUS

## 2012-07-18 MED ORDER — HYDROMORPHONE HCL PF 1 MG/ML IJ SOLN: 0.5000 mg | INTRAMUSCULAR | Status: DC | PRN

## 2012-07-18 MED ORDER — CALCIUM CARBONATE-VITAMIN D 500-200 MG-UNIT PO TABS
2.0000 | ORAL_TABLET | Freq: Every day | ORAL | Status: DC
  Administered 2012-07-19 – 2012-07-22 (×4): 2 via ORAL
  Filled 2012-07-18 (×5): qty 2

## 2012-07-18 MED ORDER — ONDANSETRON HCL 4 MG PO TABS: 4.0000 mg | ORAL_TABLET | Freq: Four times a day (QID) | ORAL | Status: DC | PRN

## 2012-07-18 MED ORDER — OXYCODONE HCL 5 MG PO TABS: 5.0000 mg | ORAL_TABLET | ORAL | Status: DC | PRN

## 2012-07-18 MED ORDER — DIAZEPAM 5 MG PO TABS: 2.5000 mg | ORAL_TABLET | Freq: Two times a day (BID) | ORAL | Status: DC | PRN

## 2012-07-18 MED ORDER — LEVOTHYROXINE SODIUM 88 MCG PO TABS
88.0000 ug | ORAL_TABLET | Freq: Every day | ORAL | Status: DC
  Administered 2012-07-19 – 2012-07-22 (×4): 88 ug via ORAL
  Filled 2012-07-18 (×5): qty 1

## 2012-07-18 NOTE — ED Provider Notes (Addendum)
History     CSN: [id number]  Arrival date & time 07/18/12  1712   First MD Initiated Contact with Patient 07/18/12 1713      Chief Complaint  Patient presents with  . Shortness of Breath    (Consider location/radiation/quality/duration/timing/severity/associated sxs/prior treatment) Patient is a 79 y.o. female presenting with shortness of breath. The history is provided by the patient.  Shortness of Breath Severity:  Severe Onset quality:  Gradual Duration:  1 day Timing:  Constant Progression:  Worsening Chronicity:  Recurrent Context: weather changes   Context: not smoke exposure and not URI   Relieved by: Improved by meds by EMS and oxygen but still not back to baseline. Worsened by:  Activity Associated symptoms: cough   Associated symptoms: no abdominal pain, no chest pain, no fever, no hemoptysis, no sputum production and no vomiting   Risk factors comment:  She is a hospice patient for end-stage COPD, home O2 and daily prednisone   Past Medical History  Diagnosis Date  . Osteoporosis     9 Compression Fx with slt cord deformity   . Other abnormal glucose   . Hyperlipidemia   . Esophageal reflux   . COPD (chronic obstructive pulmonary disease)     On home O2 and daily Prednisone since march 2009. PFTs 09/05/07 FEV1 49% with improvement by 8% ratio 39% and diffusing capacity 58%    . Hypothyroidism   . Tobacco abuse, in remission quit 2002  . Atelectasis of right lung     chronic rt lower lung seen on cT 2010  . Chronic respiratory failure     Past Surgical History  Procedure Laterality Date  . Cholecystectomy    . Ankle fracture surgery      left 10/12/2008  . Colon surgery      stents to arteries in abdomen    Family History  Problem Relation Age of Onset  . Emphysema Brother     multiple  . Emphysema Father     History  Substance Use Topics  . Smoking status: Former Smoker -- 1.50 packs/day for 50 years    Types: Cigarettes    Quit date:  05/14/2000  . Smokeless tobacco: Not on file  . Alcohol Use: No    OB History   Grav Para Term Preterm Abortions TAB SAB Ect Mult Living                  Review of Systems  Constitutional: Negative for fever.  Respiratory: Positive for cough and shortness of breath. Negative for hemoptysis and sputum production.   Cardiovascular: Negative for chest pain.  Gastrointestinal: Negative for vomiting and abdominal pain.  All other systems reviewed and are negative.    Allergies  Cefdinir; Levofloxacin; Statins; Sulfonamide derivatives; and Penicillins  Home Medications   Current Outpatient Rx  Name  Route  Sig  Dispense  Refill  . albuterol (PROVENTIL HFA) 108 (90 BASE) MCG/ACT inhaler   Inhalation   Inhale 2 puffs into the lungs every 4 (four) hours as needed for wheezing. For shortness of breath   1 Inhaler   11     Hospice nurse   . aspirin EC 81 MG tablet   Oral   Take 81 mg by mouth daily.         . budesonide (PULMICORT) 0.25 MG/2ML nebulizer solution   Nebulization   Take 2 mLs (0.25 mg total) by nebulization 2 (two) times daily.         Marland Kitchen  calcium citrate-vitamin D (CITRACAL+D) 315-200 MG-UNIT per tablet   Oral   Take 2 tablets by mouth daily.           . Cholecalciferol (VITAMIN D3) 1000 UNITS tablet   Oral   Take 2,000 Units by mouth 2 (two) times daily.          . clopidogrel (PLAVIX) 75 MG tablet   Oral   Take 75 mg by mouth daily.           . diazepam (VALIUM) 5 MG tablet   Oral   Take 2.5 mg by mouth 2 (two) times daily as needed.          . ferrous sulfate 325 (65 FE) MG EC tablet   Oral   Take 325 mg by mouth daily.          . furosemide (LASIX) 40 MG tablet      Take 1 and 1/2 tabs by mouth once daily         . guaiFENesin (MUCINEX) 600 MG 12 hr tablet      1-2 every 12 hour as needed for cough and congestion   60 tablet   1   . hydrochlorothiazide (MICROZIDE) 12.5 MG capsule   Oral   Take 1 capsule (12.5 mg total)  by mouth daily.   30 capsule   0   . insulin lispro protamine-insulin lispro (HUMALOG 75/25) (75-25) 100 UNIT/ML SUSP      34 units with breakfast, 10 units with dinner         . levothyroxine (SYNTHROID, LEVOTHROID) 88 MCG tablet   Oral   Take 88 mcg by mouth daily.           . Magnesium 250 MG TABS   Oral   Take 1 tablet by mouth daily.           Marland Kitchen olmesartan (BENICAR) 20 MG tablet   Oral   Take 0.5 tablets (10 mg total) by mouth daily.   30 tablet   3   . omeprazole (PRILOSEC) 20 MG capsule   Oral   Take 1 capsule by mouth 2 (two) times daily before a meal.         . Potassium Gluconate 595 MG CAPS      1 tablet by mouth once daily         . predniSONE (DELTASONE) 10 MG tablet      Take 1 tab by mouth once daily or increase to 2 tab until back to baseline when needed for increased shortness of breath   100 tablet   1     Take 4 tablets daily for 4 days, then 3 tablets da ...   . senna (SENOKOT) 8.6 MG tablet   Oral   Take 2 tablets by mouth 2 (two) times daily.          . sertraline (ZOLOFT) 50 MG tablet   Oral   Take 50 mg by mouth daily.          . sodium chloride (OCEAN) 0.65 % nasal spray   Nasal   Place 2 sprays into the nose 2 (two) times daily as needed for congestion.         Marland Kitchen tiotropium (SPIRIVA) 18 MCG inhalation capsule   Inhalation   Place 18 mcg into inhaler and inhale daily.           . vitamin C (ASCORBIC ACID) 500 MG tablet   Oral   Take  1,000 mg by mouth daily.             There were no vitals taken for this visit.  Physical Exam  Nursing note and vitals reviewed. Constitutional: She is oriented to person, place, and time. She appears well-developed and well-nourished. No distress.  HENT:  Head: Normocephalic and atraumatic.  Mouth/Throat: Oropharynx is clear and moist. Mucous membranes are dry.  Eyes: Conjunctivae and EOM are normal. Pupils are equal, round, and reactive to light.  Neck: Normal range of  motion. Neck supple.  Cardiovascular: Normal rate, regular rhythm and intact distal pulses.   No murmur heard. Pulmonary/Chest: Accessory muscle usage present. Tachypnea noted. She is in respiratory distress. She has decreased breath sounds in the right lower field and the left lower field. She has wheezes in the right upper field and the left upper field. She has no rales.  Abdominal: Soft. She exhibits distension. There is no tenderness. There is no rebound and no guarding.  Musculoskeletal: Normal range of motion. She exhibits no edema and no tenderness.  Neurological: She is alert and oriented to person, place, and time.  Skin: Skin is warm and dry. No rash noted. No erythema.  Psychiatric: She has a normal mood and affect. Her behavior is normal.    ED Course  Procedures (including critical care time)  Labs Reviewed  CBC WITH DIFFERENTIAL - Abnormal; Notable for the following:    RBC 3.74 (*)    HCT 35.3 (*)    Neutrophils Relative 85 (*)    Neutro Abs 8.8 (*)    Lymphocytes Relative 10 (*)    All other components within normal limits  URINALYSIS, MICROSCOPIC ONLY - Abnormal; Notable for the following:    Casts HYALINE CASTS (*)    All other components within normal limits  POCT I-STAT, CHEM 8 - Abnormal; Notable for the following:    Sodium 134 (*)    BUN 37 (*)    Creatinine, Ser 1.30 (*)    Glucose, Bld 170 (*)    Hemoglobin 11.9 (*)    HCT 35.0 (*)    All other components within normal limits   Dg Wrist 2 Views Left  07/18/2012  *RADIOLOGY REPORT*  Clinical Data: Fall 2 days ago with bruising.  LEFT WRIST - 2 VIEW  Comparison: None.  Findings: AP and lateral portable views.  Artifact degradation from IV tubing.  Degenerative irregularity with flattening of the lunate and mild widening of the scapholunate interval.  No oblique image submitted.  No gross fracture on 2 view exam. Question heterotopic ossification versus degenerative irregularity adjacent the distal ulna.   IMPRESSION: Limited, 2 view portable exam.  Given this factor, no definite acute osseous finding.  Degenerative irregularity with flattening of the lunate and widening of the scapholunate interval.  The latter finding suggests chronic ligamentous insufficiency.   Original Report Authenticated By: Jeronimo Greaves, M.D.    Dg Chest Port 1 View  07/18/2012  *RADIOLOGY REPORT*  Clinical Data: Shortness of breath  PORTABLE CHEST - 1 VIEW  Comparison: 05/28/2012  Findings: The patient is rotated.  Chronic interstitial markings/emphysematous changes.  Bibasilar scarring, chronic.  No pleural effusion or pneumothorax.  Cardiomegaly.  Degenerative changes of the right shoulder with deformity of the humeral head.  IMPRESSION: No evidence of acute cardiopulmonary disease.  Chronic interstitial markings/emphysematous changes with bibasilar scarring.   Original Report Authenticated By: Charline Bills, M.D.      Date: 07/18/2012  Rate: 81  Rhythm: normal sinus  rhythm with PVC  QRS Axis: normal  Intervals: normal  ST/T Wave abnormalities: nonspecific ST/T changes  Conduction Disutrbances:right bundle branch block  Narrative Interpretation:   Old EKG Reviewed: none available    1. COPD exacerbation   2. Dehydration       MDM   Patient is a hospice patient for respiratory failure due to COPD. She started having increasing difficulty breathing today but due to the weather was unable to have home hospice come out. When EMS arrived she was satting 70% and was given albuterol and Atrovent due to diffuse wheezing. On arrival here patient's O2 sats are 99% however she still tachypneic and wheezing. She was given Solu-Medrol as well as albuterol and Atrovent. She is a DO NOT RESUSCITATE.  CBC, i-STAT, chest x-ray pending.  6:33 PM Patient's family arrived and states she has had urinary retention since 2 AM this morning. They had recently started morphine for her shortness of breath and feel that that may be the  cause of her urinary retention, confusion and difficulty walking.   We will place a Foley catheter to relieve urinary retention but only return and feel pt is dehydrated due to elevation in cr from .63 to 1.30.  Breathing is improved but not back to baseline.  Will admit to palliative care for further hydration and breathing treatments.    Gwyneth Sprout, MD 07/18/12 1950  Gwyneth Sprout, MD 07/18/12 2005

## 2012-07-18 NOTE — ED Notes (Signed)
The pt arrived by ptar from home.  She is a hospice pt that apparently the family could not get in touch with the hospice nurse so ems was called.  hhn given enroute pt in no acute distress

## 2012-07-18 NOTE — ED Notes (Signed)
The pts daughter is at the bedside and she is reporting that the pt was brought in primarily because she has not voided  For 14 hours.  The daughter wants a foley cath inserted

## 2012-07-18 NOTE — H&P (Addendum)
[facility] History and Physical  TOSHIA LARKIN UEA:[id number] DOB: 02-Aug-1923 DOA: 07/18/2012  Referring physician: EDP PCP: Ginette Otto, MD  Specialists:   Chief Complaint: SOB  HPI: [name] is a 79 y.o. female with a history of ESCOPD, chronic Respiratory Failure on outpatient hospice care who presents to the ED with worsening cough, SOB, and urinary retention since last night.   She was given morphine last night when her symptoms began to flare up, but the symptoms were not relieved.   She denies production of her cough and denies any fevers or chills.   In the ED, she had oxygen saturations of 77% on her 2 liters of [place] O2, so her oxygen was increased to 4 liters, and she was given IV Solumedrol X 1, and nebulizer treatments and her oxygen improved to 100 %.    She was referred for admission.      Review of Systems: The patient denies anorexia, fever, weight loss, vision loss, decreased hearing, hoarseness, chest pain, syncope, peripheral edema, balance deficits, hemoptysis, abdominal pain, nausea, vomiting, diarrhea, constipation, melena, hematochezia, severe indigestion/heartburn, dysuria, hematuria, incontinence, suspicious skin lesions, transient blindness, depression, unusual weight change, abnormal bleeding, enlarged lymph nodes, angioedema, and breast masses.    Past Medical History  Diagnosis Date  . Osteoporosis     9 Compression Fx with slt cord deformity   . Other abnormal glucose   . Hyperlipidemia   . Esophageal reflux   . COPD (chronic obstructive pulmonary disease)     On home O2 and daily Prednisone since march 2009. PFTs 09/05/07 FEV1 49% with improvement by 8% ratio 39% and diffusing capacity 58%    . Hypothyroidism   . Tobacco abuse, in remission quit 2002  . Atelectasis of right lung     chronic rt lower lung seen on cT 2010  . Chronic respiratory failure    Past Surgical History  Procedure Laterality Date  . Cholecystectomy    .  Ankle fracture surgery      left 10/12/2008  . Colon surgery      stents to arteries in abdomen    Medications:  HOME MEDS: Prior to Admission medications   Medication Sig Start Date End Date Taking? Authorizing Provider  albuterol (PROVENTIL HFA) 108 (90 BASE) MCG/ACT inhaler Inhale 2 puffs into the lungs every 4 (four) hours as needed for wheezing. For shortness of breath 06/16/12  Yes Nyoka Cowden, MD  aspirin EC 81 MG tablet Take 81 mg by mouth daily.   Yes Historical Provider, MD  budesonide (PULMICORT) 0.25 MG/2ML nebulizer solution Take 2 mLs (0.25 mg total) by nebulization 2 (two) times daily. 05/29/11  Yes Tammy S Parrett, NP  calcium citrate-vitamin D (CITRACAL+D) 315-200 MG-UNIT per tablet Take 2 tablets by mouth daily.     Yes Historical Provider, MD  Cholecalciferol (VITAMIN D3) 1000 UNITS tablet Take 2,000 Units by mouth 2 (two) times daily.  10/12/10  Yes Tammy S Parrett, NP  clopidogrel (PLAVIX) 75 MG tablet Take 75 mg by mouth daily.     Yes Historical Provider, MD  diazepam (VALIUM) 5 MG tablet Take 2.5 mg by mouth 2 (two) times daily as needed.  10/12/10  Yes Tammy S Parrett, NP  ferrous sulfate 325 (65 FE) MG EC tablet Take 325 mg by mouth daily.  10/12/10  Yes Tammy S Parrett, NP  furosemide (LASIX) 40 MG tablet Take 1 and 1/2 tabs by mouth once daily 10/12/10  Yes Tammy S Parrett,  NP  guaiFENesin (MUCINEX) 600 MG 12 hr tablet 1-2 every 12 hour as needed for cough and congestion 09/04/11  Yes Nyoka Cowden, MD  insulin aspart (NOVOLOG) 100 UNIT/ML injection Inject 0-10 Units into the skin daily before lunch. Per sliding scale.   Yes Historical Provider, MD  insulin lispro protamine-insulin lispro (HUMALOG 75/25) (75-25) 100 UNIT/ML SUSP 34 units with breakfast, 10 units with dinner   Yes Tammy S Parrett, NP  levothyroxine (SYNTHROID, LEVOTHROID) 88 MCG tablet Take 88 mcg by mouth daily.     Yes Historical Provider, MD  Magnesium 250 MG TABS Take 1 tablet by mouth daily.     Yes  Historical Provider, MD  olmesartan (BENICAR) 20 MG tablet Take 0.5 tablets (10 mg total) by mouth daily. 05/15/12  Yes Tammy S Parrett, NP  omeprazole (PRILOSEC) 20 MG capsule Take 1 capsule by mouth 2 (two) times daily before a meal. 10/18/10  Yes Historical Provider, MD  Potassium Gluconate 595 MG CAPS 1 tablet by mouth once daily   Yes Historical Provider, MD  predniSONE (DELTASONE) 10 MG tablet Take 1 tab by mouth once daily or increase to 2 tab until back to baseline when needed for increased shortness of breath 05/18/11  Yes Marden Noble, MD  senna (SENOKOT) 8.6 MG tablet Take 2 tablets by mouth 2 (two) times daily.    Yes Historical Provider, MD  sertraline (ZOLOFT) 50 MG tablet Take 50 mg by mouth daily.    Yes Historical Provider, MD  sodium chloride (OCEAN) 0.65 % nasal spray Place 2 sprays into the nose 2 (two) times daily as needed for congestion. 10/12/10  Yes Tammy S Parrett, NP  tiotropium (SPIRIVA) 18 MCG inhalation capsule Place 18 mcg into inhaler and inhale daily.     Yes Historical Provider, MD  vitamin C (ASCORBIC ACID) 500 MG tablet Take 1,000 mg by mouth daily.     Yes Historical Provider, MD    Allergies:  Allergies  Allergen Reactions  . Cefdinir Hives and Shortness Of Breath  . Levofloxacin     Unable to sleep, pt stated that she felt crazy--only took for 3 days  . Statins   . Sulfonamide Derivatives   . Penicillins Rash    Social History:   reports that she quit smoking about 12 years ago. Her smoking use included Cigarettes. She has a 75 pack-year smoking history. She does not have any smokeless tobacco history on file. She reports that she does not drink alcohol or use illicit drugs.  Family History: Family History  Problem Relation Age of Onset  . Emphysema Brother     multiple  . Emphysema Father   . Diabetes Mother   . Diabetes Sister       X32  . Diabetes Brother       X2  . Congestive Heart Failure Sister   . Cancer Father     GI      Physical  Exam:  GEN:  Pleasant 79 year old Elderly Obese Caucasian Female examined  and in no acute distress; cooperative with exam Filed Vitals:   07/18/12 1900 07/18/12 1903 07/18/12 1915 07/18/12 1930  BP: 110/56 110/56 126/56 122/46  Pulse: 72 70 69 71  Temp:    97.8 F (36.6 C)  TempSrc:    Oral  Resp: 15 24 15 21   SpO2: 99% 100% 100% 100%   Blood pressure 122/46, pulse 71, temperature 97.8 F (36.6 C), temperature source Oral, resp. rate 21, SpO2 100.00%.  PSYCH: She is alert and oriented x4; does not appear anxious does not appear depressed; affect is normal HEENT: Normocephalic and Atraumatic, Mucous membranes pink; PERRLA; EOM intact; Fundi:  Benign;  No scleral icterus, Nares: Patent, Oropharynx: Clear,  Neck:  FROM, no cervical lymphadenopathy nor thyromegaly or carotid bruit; no JVD; Breasts:: Not examined CHEST WALL: No tenderness CHEST: Normal respiration, clear to auscultation bilaterally HEART: Regular rate and rhythm; no murmurs rubs or gallops BACK: No kyphosis or scoliosis; no CVA tenderness ABDOMEN: Positive Bowel Sounds, Obese Distended, soft tympanic X 4 Quadrants,  non-tender; no masses, no organomegaly. Rectal Exam: Not done EXTREMITIES: No cyanosis, +CLUBBING,  No edema; no ulcerations. Genitalia: not examined PULSES: 2+ and symmetric SKIN: Normal hydration no rash or ulceration CNS: Cranial nerves 2-12 grossly intact no focal neurologic deficit     Labs & Imaging Results for orders placed during the hospital encounter of 07/18/12 (from the past 48 hour(s))  CBC WITH DIFFERENTIAL     Status: Abnormal   Collection Time    07/18/12  5:22 PM      Result Value Range   WBC 10.3  4.0 - 10.5 K/uL   RBC 3.74 (*) 3.87 - 5.11 MIL/uL   Hemoglobin 12.0  12.0 - 15.0 g/dL   HCT 16.1 (*) 09.6 - 04.5 %   MCV 94.4  78.0 - 100.0 fL   MCH 32.1  26.0 - 34.0 pg   MCHC 34.0  30.0 - 36.0 g/dL   RDW 40.9  81.1 - 91.4 %   Platelets 243  150 - 400 K/uL   Neutrophils Relative 85  (*) 43 - 77 %   Neutro Abs 8.8 (*) 1.7 - 7.7 K/uL   Lymphocytes Relative 10 (*) 12 - 46 %   Lymphs Abs 1.1  0.7 - 4.0 K/uL   Monocytes Relative 4  3 - 12 %   Monocytes Absolute 0.4  0.1 - 1.0 K/uL   Eosinophils Relative 0  0 - 5 %   Eosinophils Absolute 0.0  0.0 - 0.7 K/uL   Basophils Relative 1  0 - 1 %   Basophils Absolute 0.1  0.0 - 0.1 K/uL  POCT I-STAT, CHEM 8     Status: Abnormal   Collection Time    07/18/12  5:46 PM      Result Value Range   Sodium 134 (*) 135 - 145 mEq/L   Potassium 4.6  3.5 - 5.1 mEq/L   Chloride 98  96 - 112 mEq/L   BUN 37 (*) 6 - 23 mg/dL   Creatinine, Ser 7.82 (*) 0.50 - 1.10 mg/dL   Glucose, Bld 956 (*) 70 - 99 mg/dL   Calcium, Ion 2.13  0.86 - 1.30 mmol/L   TCO2 31  0 - 100 mmol/L   Hemoglobin 11.9 (*) 12.0 - 15.0 g/dL   HCT 57.8 (*) 46.9 - 62.9 %  URINALYSIS, MICROSCOPIC ONLY     Status: Abnormal   Collection Time    07/18/12  6:36 PM      Result Value Range   Color, Urine YELLOW  YELLOW   APPearance CLEAR  CLEAR   Specific Gravity, Urine 1.015  1.005 - 1.030   pH 5.0  5.0 - 8.0   Glucose, UA NEGATIVE  NEGATIVE mg/dL   Hgb urine dipstick NEGATIVE  NEGATIVE   Bilirubin Urine NEGATIVE  NEGATIVE   Ketones, ur NEGATIVE  NEGATIVE mg/dL   Protein, ur NEGATIVE  NEGATIVE mg/dL   Urobilinogen, UA 0.2  0.0 - 1.0 mg/dL   Nitrite NEGATIVE  NEGATIVE   Leukocytes, UA NEGATIVE  NEGATIVE   Casts HYALINE CASTS (*) NEGATIVE    Cardiac Enzymes: No results found for this basename: CKTOTAL, CKMB, CKMBINDEX, TROPONINI,  in the last 168 hours  BNP (last 3 results)  Recent Labs  10/03/11 1248  PROBNP 60.0   CBG: No results found for this basename: GLUCAP,  in the last 168 hours  Radiological Exams on Admission: Dg Wrist 2 Views Left  07/18/2012  *RADIOLOGY REPORT*  Clinical Data: Fall 2 days ago with bruising.  LEFT WRIST - 2 VIEW  Comparison: None.  Findings: AP and lateral portable views.  Artifact degradation from IV tubing.  Degenerative  irregularity with flattening of the lunate and mild widening of the scapholunate interval.  No oblique image submitted.  No gross fracture on 2 view exam. Question heterotopic ossification versus degenerative irregularity adjacent the distal ulna.  IMPRESSION: Limited, 2 view portable exam.  Given this factor, no definite acute osseous finding.  Degenerative irregularity with flattening of the lunate and widening of the scapholunate interval.  The latter finding suggests chronic ligamentous insufficiency.   Original Report Authenticated By: Jeronimo Greaves, M.D.    Dg Chest Port 1 View  07/18/2012  *RADIOLOGY REPORT*  Clinical Data: Shortness of breath  PORTABLE CHEST - 1 VIEW  Comparison: 05/28/2012  Findings: The patient is rotated.  Chronic interstitial markings/emphysematous changes.  Bibasilar scarring, chronic.  No pleural effusion or pneumothorax.  Cardiomegaly.  Degenerative changes of the right shoulder with deformity of the humeral head.  IMPRESSION: No evidence of acute cardiopulmonary disease.  Chronic interstitial markings/emphysematous changes with bibasilar scarring.   Original Report Authenticated By: Charline Bills, M.D.     EKG:  Normal Sinus rhythm with diffuse artifact, RBBB, and PVC.     Assessment/Plan Principal Problem:   COPD exacerbation Active Problems:   Acute-on-chronic respiratory failure   Acute respiratory failure   Dehydration   HYPOTHYROIDISM   Diabetes mellitus   1.  COPD Exacerbation-   IV Steroid Taper, DuoNebs, O2 .     2.   Acute on Chronic Respiratory Failure-  Goal to maintain O2 sats >90%, and wean O2 down to 2 liters which is her home continuous dose.    3.  Dehydration-  Hold diuretics, and ACE/ARB therapy due to Increased BUN?CR, and gentle hydration ordered.   Monitor I/Os.    4.  Hypothyroidism-  Continue levothyroxine dose, check TSH.    5.   Diabetes Mellitis-   Continue Insulin ( changed to 70/30 per formulary),  Add SSI coverage PRN elevated  glucose levels.  Placed on CHO modified Medium Diet.    6.   DVT prophylaxis of Lovenox.    7.  Other- Reconcile Home Medications.       Code Status:    DO NOT RESUSCITATE (DNR) Family Communication:     Daughter at Bedside Disposition Plan:   Return to Home on Discharge  Time spent:  57 Minutes  Ron Parker [facility] Pager [phone]  If 7PM-7AM, please contact night-coverage [email or web address] Password [facility] 07/18/2012, 8:32 PM

## 2012-07-18 NOTE — ED Notes (Signed)
Placed foley cath into patient dark urine in return size 14 french cath

## 2012-07-18 NOTE — ED Notes (Signed)
hhn started  And nss added to the saline lok.  bolus

## 2012-07-19 DIAGNOSIS — J9620 Acute and chronic respiratory failure, unspecified whether with hypoxia or hypercapnia: Secondary | ICD-10-CM

## 2012-07-19 DIAGNOSIS — E860 Dehydration: Secondary | ICD-10-CM

## 2012-07-19 DIAGNOSIS — E785 Hyperlipidemia, unspecified: Secondary | ICD-10-CM

## 2012-07-19 DIAGNOSIS — E119 Type 2 diabetes mellitus without complications: Secondary | ICD-10-CM

## 2012-07-19 LAB — GLUCOSE, CAPILLARY
Glucose-Capillary: 414 mg/dL — ABNORMAL HIGH (ref 70–99)
Glucose-Capillary: 449 mg/dL — ABNORMAL HIGH (ref 70–99)

## 2012-07-19 LAB — CBC
HCT: 33.7 % — ABNORMAL LOW (ref 36.0–46.0)
Hemoglobin: 11.6 g/dL — ABNORMAL LOW (ref 12.0–15.0)
MCH: 31.9 pg (ref 26.0–34.0)
MCHC: 34.4 g/dL (ref 30.0–36.0)
RDW: 13.2 % (ref 11.5–15.5)

## 2012-07-19 LAB — BASIC METABOLIC PANEL
BUN: 38 mg/dL — ABNORMAL HIGH (ref 6–23)
Creatinine, Ser: 0.98 mg/dL (ref 0.50–1.10)
GFR calc non Af Amer: 51 mL/min — ABNORMAL LOW (ref >90)
Glucose, Bld: 309 mg/dL — ABNORMAL HIGH (ref 70–99)
Potassium: 5.3 mEq/L — ABNORMAL HIGH (ref 3.5–5.1)

## 2012-07-19 MED ORDER — INSULIN ASPART 100 UNIT/ML ~~LOC~~ SOLN
7.0000 [IU] | Freq: Once | SUBCUTANEOUS | Status: AC
  Administered 2012-07-19: 7 [IU] via SUBCUTANEOUS

## 2012-07-19 MED ORDER — METHYLPREDNISOLONE SODIUM SUCC 40 MG IJ SOLR
40.0000 mg | Freq: Three times a day (TID) | INTRAMUSCULAR | Status: DC
  Administered 2012-07-19 – 2012-07-20 (×3): 40 mg via INTRAVENOUS
  Filled 2012-07-19 (×6): qty 1

## 2012-07-19 MED ORDER — DOXYCYCLINE HYCLATE 100 MG PO TABS
100.0000 mg | ORAL_TABLET | Freq: Once | ORAL | Status: AC
  Administered 2012-07-19: 100 mg via ORAL
  Filled 2012-07-19: qty 1

## 2012-07-19 MED ORDER — SODIUM CHLORIDE 0.9 % IV SOLN: INTRAVENOUS | Status: DC

## 2012-07-19 MED ORDER — TIOTROPIUM BROMIDE MONOHYDRATE 18 MCG IN CAPS
18.0000 ug | ORAL_CAPSULE | RESPIRATORY_TRACT | Status: DC
  Administered 2012-07-20 – 2012-07-22 (×4): 18 ug via RESPIRATORY_TRACT
  Filled 2012-07-19: qty 5

## 2012-07-19 MED ORDER — LEVOFLOXACIN 750 MG PO TABS
750.0000 mg | ORAL_TABLET | ORAL | Status: DC
  Filled 2012-07-19: qty 1

## 2012-07-19 MED ORDER — FUROSEMIDE 40 MG PO TABS
60.0000 mg | ORAL_TABLET | Freq: Every day | ORAL | Status: DC
  Administered 2012-07-20 – 2012-07-22 (×3): 60 mg via ORAL
  Filled 2012-07-19 (×3): qty 1

## 2012-07-19 MED ORDER — BUDESONIDE 0.25 MG/2ML IN SUSP
0.2500 mg | Freq: Two times a day (BID) | RESPIRATORY_TRACT | Status: DC
  Administered 2012-07-19 – 2012-07-22 (×5): 0.25 mg via RESPIRATORY_TRACT
  Filled 2012-07-19 (×8): qty 2

## 2012-07-19 MED ORDER — DOXYCYCLINE HYCLATE 100 MG PO TABS
100.0000 mg | ORAL_TABLET | Freq: Two times a day (BID) | ORAL | Status: DC
  Administered 2012-07-20 – 2012-07-22 (×5): 100 mg via ORAL
  Filled 2012-07-19 (×7): qty 1

## 2012-07-19 MED ORDER — ENOXAPARIN SODIUM 40 MG/0.4ML ~~LOC~~ SOLN
40.0000 mg | Freq: Every day | SUBCUTANEOUS | Status: DC
  Administered 2012-07-19 – 2012-07-21 (×3): 40 mg via SUBCUTANEOUS
  Filled 2012-07-19 (×4): qty 0.4

## 2012-07-19 MED ORDER — ALBUTEROL SULFATE (5 MG/ML) 0.5% IN NEBU
5.0000 mg | INHALATION_SOLUTION | RESPIRATORY_TRACT | Status: DC | PRN
  Administered 2012-07-19: 5 mg via RESPIRATORY_TRACT
  Filled 2012-07-19 (×3): qty 0.5

## 2012-07-19 MED ORDER — FUROSEMIDE 40 MG PO TABS
40.0000 mg | ORAL_TABLET | Freq: Once | ORAL | Status: AC
  Administered 2012-07-19: 40 mg via ORAL
  Filled 2012-07-19: qty 1

## 2012-07-19 NOTE — Progress Notes (Signed)
Utilization Review Completed.   Kimberly Tucker, RN, BSN Nurse Case Manager  [phone]  

## 2012-07-19 NOTE — Progress Notes (Signed)
Nutrition Brief Note:  Inaccurate positive MST screen.  Pt denied wt change or decreased appetite on admission.  Pt not assessed by RD at this time.  Please consult RD if nutrition assessment appropriate and warranted for this pt.  Loyce Dys, MS RD LDN Clinical Inpatient Dietitian Pager: [phone] Weekend/After hours pager: [phone]

## 2012-07-19 NOTE — Progress Notes (Signed)
ANTIBIOTIC CONSULT NOTE - INITIAL  Pharmacy Consult for levofloxacin Indication: COPD exacerbation  Allergies  Allergen Reactions  . Cefdinir Hives and Shortness Of Breath  . Levofloxacin     Unable to sleep, pt stated that she felt crazy--only took for 3 days  . Statins   . Sulfonamide Derivatives   . Penicillins Rash    Patient Measurements: Height: 5\' 2"  (157.5 cm) Weight: 155 lb 13.8 oz (70.7 kg) IBW/kg (Calculated) : 50.1   Vital Signs: Temp: 97.8 F (36.6 C) (03/08 0554) Temp src: Oral (03/08 0554) BP: 131/55 mmHg (03/08 0554) Pulse Rate: 79 (03/08 0554) Intake/Output from previous day: 03/07 0701 - 03/08 0700 In: 600 [I.V.:600] Out: 1200 [Urine:1200] Intake/Output from this shift:    Labs:  Recent Labs  07/18/12 1722 07/18/12 1746 07/19/12 0511  WBC 10.3  --  8.8  HGB 12.0 11.9* 11.6*  PLT 243  --  252  CREATININE  --  1.30* 0.98   Estimated Creatinine Clearance: 37.9 ml/min (by C-G formula based on Cr of 0.98). No results found for this basename: VANCOTROUGH, VANCOPEAK, VANCORANDOM, GENTTROUGH, GENTPEAK, GENTRANDOM, TOBRATROUGH, TOBRAPEAK, TOBRARND, AMIKACINPEAK, AMIKACINTROU, AMIKACIN,  in the last 72 hours   Microbiology: No results found for this or any previous visit (from the past 720 hour(s)).  Medical History: Past Medical History  Diagnosis Date  . Osteoporosis     9 Compression Fx with slt cord deformity   . Other abnormal glucose   . Hyperlipidemia   . Esophageal reflux   . COPD (chronic obstructive pulmonary disease)     On home O2 and daily Prednisone since march 2009. PFTs 09/05/07 FEV1 49% with improvement by 8% ratio 39% and diffusing capacity 58%    . Hypothyroidism   . Tobacco abuse, in remission quit 2002  . Atelectasis of right lung     chronic rt lower lung seen on cT 2010  . Chronic respiratory failure     Medications:  Prescriptions prior to admission  Medication Sig Dispense Refill  . albuterol (PROVENTIL HFA) 108  (90 BASE) MCG/ACT inhaler Inhale 2 puffs into the lungs every 4 (four) hours as needed for wheezing. For shortness of breath  1 Inhaler  11  . aspirin EC 81 MG tablet Take 81 mg by mouth daily.      . budesonide (PULMICORT) 0.25 MG/2ML nebulizer solution Take 2 mLs (0.25 mg total) by nebulization 2 (two) times daily.      . calcium citrate-vitamin D (CITRACAL+D) 315-200 MG-UNIT per tablet Take 2 tablets by mouth daily.        . Cholecalciferol (VITAMIN D3) 1000 UNITS tablet Take 2,000 Units by mouth 2 (two) times daily.       . clopidogrel (PLAVIX) 75 MG tablet Take 75 mg by mouth daily.        . diazepam (VALIUM) 5 MG tablet Take 2.5 mg by mouth 2 (two) times daily as needed.       . ferrous sulfate 325 (65 FE) MG EC tablet Take 325 mg by mouth daily.       . furosemide (LASIX) 40 MG tablet Take 1 and 1/2 tabs by mouth once daily      . guaiFENesin (MUCINEX) 600 MG 12 hr tablet 1-2 every 12 hour as needed for cough and congestion  60 tablet  1  . insulin aspart (NOVOLOG) 100 UNIT/ML injection Inject 0-10 Units into the skin daily before lunch. Per sliding scale.      . insulin lispro  protamine-insulin lispro (HUMALOG 75/25) (75-25) 100 UNIT/ML SUSP 34 units with breakfast, 10 units with dinner      . levothyroxine (SYNTHROID, LEVOTHROID) 88 MCG tablet Take 88 mcg by mouth daily.        . Magnesium 250 MG TABS Take 1 tablet by mouth daily.        Marland Kitchen olmesartan (BENICAR) 20 MG tablet Take 0.5 tablets (10 mg total) by mouth daily.  30 tablet  3  . omeprazole (PRILOSEC) 20 MG capsule Take 1 capsule by mouth 2 (two) times daily before a meal.      . Potassium Gluconate 595 MG CAPS 1 tablet by mouth once daily      . predniSONE (DELTASONE) 10 MG tablet Take 1 tab by mouth once daily or increase to 2 tab until back to baseline when needed for increased shortness of breath  100 tablet  1  . senna (SENOKOT) 8.6 MG tablet Take 2 tablets by mouth 2 (two) times daily.       . sertraline (ZOLOFT) 50 MG tablet  Take 50 mg by mouth daily.       . sodium chloride (OCEAN) 0.65 % nasal spray Place 2 sprays into the nose 2 (two) times daily as needed for congestion.      Marland Kitchen tiotropium (SPIRIVA) 18 MCG inhalation capsule Place 18 mcg into inhaler and inhale daily.        . vitamin C (ASCORBIC ACID) 500 MG tablet Take 1,000 mg by mouth daily.         Assessment: 79 yo F with COPD exacerbation.  Pharmacy asked to dose PO levofloxacin x 5 days. SCr was elevated, despite improvement CrCl remains ~38 ml/min.  Goal of Therapy:  Appropriate dosing  Plan:  - Levofloxacin 750 mg PO q48h x 5 days  - Follow up SCr, UOP, cultures, clinical course and adjust as clinically indicated  [name], PharmD, BCPS Clinical Pharmacist Pager: [phone] Pharmacy: [phone] 07/19/2012 11:47 AM

## 2012-07-19 NOTE — Progress Notes (Signed)
Found pt in bathroom with all tubings stretched out.  Foley cath came out with water baloon still intact. Minimal bloody drainage noted but denies any pain.  Md notified

## 2012-07-19 NOTE — Progress Notes (Signed)
[facility] PROGRESS NOTE  KAMORI KITCHENS WUJ:[id number] DOB: 16-Mar-1924 DOA: 07/18/2012 PCP: Ginette Otto, MD  Assessment/Plan: Severe COPD with Exacerbation Continue IV Steroid Taper. Continue DuoNebs and Oxygen. Currently enrolled in hospice. Transition to predisone likely tomorrow. Start levofloxacin for a total of 5 day course.  Acute on Chronic Respiratory Failure Goal to maintain O2 sats >90%. Continue oxygen 2L per min (home dose). Management as indicated above.  Dehydration Continue to hold diuretics, and ACE/ARB therapy. Renal function stable. Monitor I/Os.   Hypothyroidism Continue levothyroxine dose. TSH pending.   Diabetes Mellitis Continue Insulin (changed to 70/30 per formulary). SSI. Elevated due to steroids. Hemoglobin A1C pending. CHO modified Medium Diet.   GERD PPI  Generalized weakness Request PT evaluation. Likely due to deconditioning and due to severe COPD.  Prophylaxis Lovenox.  Code Status: DNR/DNI Family Communication: No family present. Disposition Plan: Pending.   Consultants:  None.  Procedures:  None  Antibiotics:  Levofloxacin 07/19/2012 >>  HPI/Subjective: Still short of breath. No other specific concerns.  Objective: Filed Vitals:   07/18/12 2130 07/19/12 0217 07/19/12 0554 07/19/12 0826  BP: 155/53 136/49 131/55   Pulse: 85 69 79   Temp: 97.7 F (36.5 C) 97.7 F (36.5 C) 97.8 F (36.6 C)   TempSrc: Oral Oral Oral   Resp: 20 20 20    Height: 5\' 2"  (1.575 m)     Weight: 70.7 kg (155 lb 13.8 oz)     SpO2: 100% 97% 97% 96%    Intake/Output Summary (Last 24 hours) at 07/19/12 1201 Last data filed at 07/19/12 0600  Gross per 24 hour  Intake    600 ml  Output   1200 ml  Net   -600 ml   Filed Weights   07/18/12 2130  Weight: 70.7 kg (155 lb 13.8 oz)    Exam: Physical Exam: General: Awake, Oriented, No acute distress. HEENT: EOMI. Neck: Supple CV: S1 and S2 Lungs: Very poor air movement. No lower  airway wheezing, more upper airway wheezing. Abdomen: Soft, Nontender, Nondistended, +bowel sounds. Ext: Good pulses. Trace edema.  Data Reviewed: Basic Metabolic Panel:  Recent Labs Lab 07/18/12 1746 07/19/12 0511  NA 134* 133*  K 4.6 5.3*  CL 98 97  CO2  --  27  GLUCOSE 170* 309*  BUN 37* 38*  CREATININE 1.30* 0.98  CALCIUM  --  9.5   Liver Function Tests: No results found for this basename: AST, ALT, ALKPHOS, BILITOT, PROT, ALBUMIN,  in the last 168 hours No results found for this basename: LIPASE, AMYLASE,  in the last 168 hours No results found for this basename: AMMONIA,  in the last 168 hours CBC:  Recent Labs Lab 07/18/12 1722 07/18/12 1746 07/19/12 0511  WBC 10.3  --  8.8  NEUTROABS 8.8*  --   --   HGB 12.0 11.9* 11.6*  HCT 35.3* 35.0* 33.7*  MCV 94.4  --  92.6  PLT 243  --  252   Cardiac Enzymes: No results found for this basename: CKTOTAL, CKMB, CKMBINDEX, TROPONINI,  in the last 168 hours BNP (last 3 results)  Recent Labs  10/03/11 1248  PROBNP 60.0   CBG:  Recent Labs Lab 07/18/12 2152 07/19/12 0804  GLUCAP 157* 303*    No results found for this or any previous visit (from the past 240 hour(s)).   Studies: Dg Wrist 2 Views Left  07/18/2012  *RADIOLOGY REPORT*  Clinical Data: Fall 2 days ago with bruising.  LEFT WRIST - 2  VIEW  Comparison: None.  Findings: AP and lateral portable views.  Artifact degradation from IV tubing.  Degenerative irregularity with flattening of the lunate and mild widening of the scapholunate interval.  No oblique image submitted.  No gross fracture on 2 view exam. Question heterotopic ossification versus degenerative irregularity adjacent the distal ulna.  IMPRESSION: Limited, 2 view portable exam.  Given this factor, no definite acute osseous finding.  Degenerative irregularity with flattening of the lunate and widening of the scapholunate interval.  The latter finding suggests chronic ligamentous insufficiency.    Original Report Authenticated By: Jeronimo Greaves, M.D.    Dg Chest Port 1 View  07/18/2012  *RADIOLOGY REPORT*  Clinical Data: Shortness of breath  PORTABLE CHEST - 1 VIEW  Comparison: 05/28/2012  Findings: The patient is rotated.  Chronic interstitial markings/emphysematous changes.  Bibasilar scarring, chronic.  No pleural effusion or pneumothorax.  Cardiomegaly.  Degenerative changes of the right shoulder with deformity of the humeral head.  IMPRESSION: No evidence of acute cardiopulmonary disease.  Chronic interstitial markings/emphysematous changes with bibasilar scarring.   Original Report Authenticated By: Charline Bills, M.D.     Scheduled Meds: . ipratropium  0.5 mg Nebulization Q4H   And  . albuterol  2.5 mg Nebulization Q4H  . aspirin EC  81 mg Oral Daily  . calcium-vitamin D  2 tablet Oral Daily  . cholecalciferol  2,000 Units Oral BID  . clopidogrel  75 mg Oral Daily  . enoxaparin (LOVENOX) injection  30 mg Subcutaneous Q24H  . ferrous sulfate  325 mg Oral Daily  . insulin aspart  0-5 Units Subcutaneous QHS  . insulin aspart  0-9 Units Subcutaneous TID WC  . insulin aspart protamine-insulin aspart  10 Units Subcutaneous Q supper  . levofloxacin  750 mg Oral Q48H  . levothyroxine  88 mcg Oral Daily  . magnesium oxide  200 mg Oral Daily  . methylPREDNISolone (SOLU-MEDROL) injection  80 mg Intravenous Q12H  . pantoprazole  40 mg Oral Daily  . senna  2 tablet Oral BID  . sertraline  50 mg Oral Daily  . vitamin C  1,000 mg Oral Daily   Continuous Infusions: . sodium chloride 75 mL/hr at 07/19/12 1610    Principal Problem:   COPD exacerbation Active Problems:   HYPOTHYROIDISM   Diabetes mellitus   Acute-on-chronic respiratory failure   Acute respiratory failure   Dehydration    REDDY,SRIKAR A, MD  [facility] Pager [phone]. If 7PM-7AM, please contact night-coverage at [email or web address], password [facility] 07/19/2012, 12:01 PM  LOS: 1 day

## 2012-07-20 ENCOUNTER — Inpatient Hospital Stay (HOSPITAL_COMMUNITY)

## 2012-07-20 LAB — BASIC METABOLIC PANEL
BUN: 41 mg/dL — ABNORMAL HIGH (ref 6–23)
Chloride: 95 mEq/L — ABNORMAL LOW (ref 96–112)
GFR calc Af Amer: 68 mL/min — ABNORMAL LOW (ref >90)
GFR calc non Af Amer: 59 mL/min — ABNORMAL LOW (ref >90)
Glucose, Bld: 343 mg/dL — ABNORMAL HIGH (ref 70–99)
Potassium: 4.5 mEq/L (ref 3.5–5.1)
Sodium: 136 mEq/L (ref 135–145)

## 2012-07-20 LAB — CBC
HCT: 31.5 % — ABNORMAL LOW (ref 36.0–46.0)
Hemoglobin: 10.8 g/dL — ABNORMAL LOW (ref 12.0–15.0)
MCHC: 34.3 g/dL (ref 30.0–36.0)
WBC: 8.4 10*3/uL (ref 4.0–10.5)

## 2012-07-20 LAB — TSH: TSH: 1.114 u[IU]/mL (ref 0.350–4.500)

## 2012-07-20 LAB — GLUCOSE, CAPILLARY

## 2012-07-20 LAB — HEMOGLOBIN A1C: Hgb A1c MFr Bld: 8.3 % — ABNORMAL HIGH (ref <5.7)

## 2012-07-20 MED ORDER — INSULIN ASPART PROT & ASPART (70-30 MIX) 100 UNIT/ML ~~LOC~~ SUSP
34.0000 [IU] | Freq: Every day | SUBCUTANEOUS | Status: DC
  Administered 2012-07-20 – 2012-07-22 (×3): 34 [IU] via SUBCUTANEOUS
  Filled 2012-07-20: qty 10

## 2012-07-20 MED ORDER — ALBUTEROL SULFATE (5 MG/ML) 0.5% IN NEBU
2.5000 mg | INHALATION_SOLUTION | Freq: Three times a day (TID) | RESPIRATORY_TRACT | Status: DC
  Administered 2012-07-21 – 2012-07-22 (×5): 2.5 mg via RESPIRATORY_TRACT
  Filled 2012-07-20 (×4): qty 0.5

## 2012-07-20 MED ORDER — INSULIN ASPART 100 UNIT/ML ~~LOC~~ SOLN
0.0000 [IU] | Freq: Three times a day (TID) | SUBCUTANEOUS | Status: DC
  Administered 2012-07-20: 4 [IU] via SUBCUTANEOUS
  Administered 2012-07-20: 7 [IU] via SUBCUTANEOUS
  Administered 2012-07-21: 4 [IU] via SUBCUTANEOUS
  Administered 2012-07-21: 11 [IU] via SUBCUTANEOUS
  Administered 2012-07-21: 4 [IU] via SUBCUTANEOUS
  Administered 2012-07-22: 3 [IU] via SUBCUTANEOUS

## 2012-07-20 MED ORDER — INSULIN ASPART 100 UNIT/ML ~~LOC~~ SOLN
0.0000 [IU] | Freq: Every day | SUBCUTANEOUS | Status: DC
  Administered 2012-07-20: 3 [IU] via SUBCUTANEOUS

## 2012-07-20 MED ORDER — POLYETHYLENE GLYCOL 3350 17 G PO PACK
17.0000 g | PACK | Freq: Two times a day (BID) | ORAL | Status: DC | PRN
  Administered 2012-07-20: 17 g via ORAL
  Filled 2012-07-20 (×2): qty 1

## 2012-07-20 MED ORDER — METHYLPREDNISOLONE SODIUM SUCC 40 MG IJ SOLR
40.0000 mg | Freq: Three times a day (TID) | INTRAMUSCULAR | Status: AC
  Administered 2012-07-20 (×2): 40 mg via INTRAVENOUS
  Filled 2012-07-20 (×2): qty 1

## 2012-07-20 MED ORDER — INSULIN ASPART PROT & ASPART (70-30 MIX) 100 UNIT/ML ~~LOC~~ SUSP: 34.0000 [IU] | Freq: Every day | SUBCUTANEOUS | Status: DC

## 2012-07-20 MED ORDER — IRBESARTAN 75 MG PO TABS
75.0000 mg | ORAL_TABLET | Freq: Every day | ORAL | Status: DC
  Administered 2012-07-20 – 2012-07-22 (×3): 75 mg via ORAL
  Filled 2012-07-20 (×3): qty 1

## 2012-07-20 MED ORDER — SENNOSIDES 8.6 MG PO TABS
2.0000 | ORAL_TABLET | Freq: Two times a day (BID) | ORAL | Status: DC
  Administered 2012-07-20: 25.8 mg via ORAL
  Administered 2012-07-21: 17.2 mg via ORAL
  Administered 2012-07-21: 25.8 mg via ORAL
  Filled 2012-07-20 (×5): qty 3

## 2012-07-20 MED ORDER — PREDNISONE 20 MG PO TABS
40.0000 mg | ORAL_TABLET | Freq: Every day | ORAL | Status: DC
  Administered 2012-07-21 – 2012-07-22 (×2): 40 mg via ORAL
  Filled 2012-07-20 (×3): qty 2

## 2012-07-20 MED ORDER — BISACODYL 10 MG RE SUPP: 10.0000 mg | Freq: Two times a day (BID) | RECTAL | Status: DC | PRN

## 2012-07-20 NOTE — Progress Notes (Signed)
Abdl xray not done today.Pt's daughter refuses xray to be done tonight, said it's already late and pt wants to rest. Elray Mcgregor notified.

## 2012-07-20 NOTE — Evaluation (Signed)
Physical Therapy Evaluation Patient Details Name: [name] MRN: [id number] DOB: 01-24-1924 Today's Date: 07/20/2012 Time: 4098-1191 PT Time Calculation (min): 42 min  PT Assessment / Plan / Recommendation Clinical Impression  pt presents with COPD exac.  pt moves slowly and gets SOB with activity.  pt notes too fatigued after using 3-in-1 to attempt ambulating.      PT Assessment  Patient needs continued PT services    Follow Up Recommendations  Home health PT;Supervision/Assistance - 24 hour (vs SNF if family unable to continue caring for pt.  )    Does the patient have the potential to tolerate intense rehabilitation      Barriers to Discharge None      Equipment Recommendations  None recommended by PT    Recommendations for Other Services OT consult   Frequency Min 3X/week    Precautions / Restrictions Precautions Precautions: Fall Restrictions Weight Bearing Restrictions: No   Pertinent Vitals/Pain Indicates arthritis pain in R should and bil knees.        Mobility  Bed Mobility Bed Mobility: Supine to Sit;Sitting - Scoot to [place] of Bed;Sit to Supine Supine to Sit: 4: Min assist;With rails Sitting - Scoot to [place] of Bed: 5: Supervision Sit to Supine: 4: Min assist Details for Bed Mobility Assistance: A with trunk to come to sit and LEs to return to bed.   Transfers Transfers: Sit to Stand;Stand to Sit;Stand Pivot Transfers Sit to Stand: 4: Min assist;With upper extremity assist;From bed;From chair/3-in-1;With armrests Stand to Sit: 4: Min assist;With upper extremity assist;To chair/3-in-1;With armrests;To bed Stand Pivot Transfers: 4: Min assist Details for Transfer Assistance: A with balance and cues for use of armrests.   Ambulation/Gait Ambulation/Gait Assistance: Not tested (comment) Stairs: No Wheelchair Mobility Wheelchair Mobility: No    Exercises     PT Diagnosis: Difficulty walking;Generalized weakness (Decondition/Debility)  PT Problem  List: Decreased strength;Decreased activity tolerance;Decreased balance;Decreased mobility;Decreased knowledge of use of DME;Cardiopulmonary status limiting activity PT Treatment Interventions: DME instruction;Gait training;Stair training;Functional mobility training;Therapeutic activities;Therapeutic exercise;Balance training;Patient/family education   PT Goals Acute Rehab PT Goals PT Goal Formulation: With patient Time For Goal Achievement: 08/03/12 Potential to Achieve Goals: Fair Pt will go Supine/Side to Sit: with modified independence PT Goal: Supine/Side to Sit - Progress: Goal set today Pt will go Sit to Supine/Side: with modified independence PT Goal: Sit to Supine/Side - Progress: Goal set today Pt will go Sit to Stand: with modified independence PT Goal: Sit to Stand - Progress: Goal set today Pt will go Stand to Sit: with modified independence PT Goal: Stand to Sit - Progress: Goal set today Pt will Ambulate: 51 - 150 feet;with supervision;with rolling walker PT Goal: Ambulate - Progress: Goal set today Pt will Go Up / Down Stairs: 1-2 stairs;with min assist;with least restrictive assistive device PT Goal: Up/Down Stairs - Progress: Goal set today  Visit Information  Last PT Received On: 07/20/12 Assistance Needed: +1    Subjective Data  Subjective: Boy, I can't do sh*t.   Patient Stated Goal: Feel better   Prior Functioning  Home Living Lives With: Spouse (Daughter and Son-in-law live upstairs.  ) Available Help at Discharge: Family;Available 24 hours/day Type of Home: House Home Access: Stairs to enter [employer] of Steps: 2 Entrance Stairs-Rails: Right Home Layout: Two level;Able to live on main level with bedroom/bathroom Bathroom Shower/Tub: Engineer, manufacturing systems: Standard Bathroom Accessibility: Yes How Accessible: Accessible via walker Home Adaptive Equipment: Grab bars around toilet;Grab bars in shower;Raised  toilet seat with  rails;Tub transfer bench;Walker - rolling (Transport W/C) Prior Function Level of Independence: Needs assistance Needs Assistance: Bathing;Dressing;Meal Prep;Light Housekeeping;Gait Bath: Moderate Dressing: Moderate Meal Prep: Total Light Housekeeping: Total Gait Assistance: pt uses RW for short distances only.  Family uses transport chair if pt leaves the house.   Able to Take Stairs?: Yes Driving: No Vocation: Retired Musician: No difficulties    Copywriter, advertising Overall Cognitive Status: Appears within functional limits for tasks assessed/performed Arousal/Alertness: Awake/alert Orientation Level: Appears intact for tasks assessed Behavior During Session: [facility] for tasks performed    Extremity/Trunk Assessment Right Lower Extremity Assessment RLE ROM/Strength/Tone: Deficits RLE ROM/Strength/Tone Deficits: Generally weak.   RLE Sensation: WFL - Light Touch Left Lower Extremity Assessment LLE ROM/Strength/Tone: Deficits LLE ROM/Strength/Tone Deficits: Generally weak.   LLE Sensation: WFL - Light Touch Trunk Assessment Trunk Assessment: Kyphotic   Balance Balance Balance Assessed: No  End of Session PT - End of Session Equipment Utilized During Treatment: Gait belt Activity Tolerance: Patient limited by fatigue Patient left: in bed;with call bell/phone within reach;with bed alarm set Nurse Communication: Mobility status  GP     Sunny Schlein, [place] [phone] 07/20/2012, 4:12 PM
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
## 2012-07-21 LAB — GLUCOSE, CAPILLARY
Glucose-Capillary: 165 mg/dL — ABNORMAL HIGH (ref 70–99)
Glucose-Capillary: 152 mg/dL — ABNORMAL HIGH (ref 70–99)

## 2012-07-21 NOTE — Progress Notes (Signed)
OT Cancellation Note  Patient Details Name: [name] MRN: [id number] DOB: [birth year]   Cancelled Treatment:    Reason Eval/Treat Not Completed: Fatigue/lethargy limiting ability to participate.  Attempted to see pt x 3.  Pt too fatigued, eating, then just getting back to bed from [facility] and is fatigued.  Will re-attempt.  Jeani Hawking M [phone] 07/21/2012, 1:52 PM
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
## 2012-07-22 DIAGNOSIS — J441 Chronic obstructive pulmonary disease with (acute) exacerbation: Principal | ICD-10-CM

## 2012-07-22 MED ORDER — PREDNISONE 10 MG PO TABS: ORAL_TABLET | ORAL | Status: DC

## 2012-07-22 MED ORDER — ALBUTEROL SULFATE (5 MG/ML) 0.5% IN NEBU: 5.0000 mg | INHALATION_SOLUTION | RESPIRATORY_TRACT | Status: DC | PRN

## 2012-07-22 MED ORDER — DOXYCYCLINE HYCLATE 100 MG PO TABS: 100.0000 mg | ORAL_TABLET | Freq: Two times a day (BID) | ORAL | Status: DC
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
## 2012-07-22 NOTE — Progress Notes (Signed)
Patient discharged to home with daughter.  Discharge teaching completed with daughter including follow up care, medications, diet.  Verbalizes understanding with no further questions.  Received respiratory treatment prior to discharge.  02 2l on for discharge. Hospice will continue to follow patient at home.  Vital signs stable. Discharged per wheelchair with daughter.
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
## 2012-08-13 ENCOUNTER — Ambulatory Visit: Payer: Medicare Other | Admitting: Internal Medicine

## 2012-12-11 ENCOUNTER — Telehealth: Payer: Self-pay | Admitting: Internal Medicine

## 2012-12-11 NOTE — Telephone Encounter (Signed)
Called, spoke with Alona Bene.  Informed her of below per MW.  She verbalized understanding and voiced no further questions or concerns at this time.

## 2012-12-11 NOTE — Telephone Encounter (Signed)
Makes no difference which dose of budesonide is used - whichever she has access to is fine

## 2012-12-11 NOTE — Telephone Encounter (Signed)
Called and spoke with joyce---  Hospice changed her from spiriva to duoneb.  Should her mother be doing the 0.5 of the budesonide instead of the 0.25,   would this help her more?  MW please advise.  thanks

## 2013-03-25 ENCOUNTER — Telehealth: Payer: Self-pay | Admitting: Internal Medicine

## 2013-03-25 NOTE — Telephone Encounter (Signed)
Spoke with Alona Bene.  She accidentally gave pt the pt's husband's budesonide 0.5 bid instead of the budesonide 0.25 bid for the past few weeks.  Has noticed pt having increased feet swelling - ? If this is a side effect from increased budesonide.  I spoke with MW. It is not.  Pt will need to call PCP.  Spoke with Alona Bene.  INformed her of this.  She verbalized understanding and voice dno further questions or conerns at this time.

## 2013-03-25 NOTE — Telephone Encounter (Signed)
Patient returning call.

## 2013-03-25 NOTE — Telephone Encounter (Signed)
LMTCB

## 2013-05-13 ENCOUNTER — Other Ambulatory Visit: Payer: Self-pay | Admitting: Internal Medicine

## 2013-05-25 ENCOUNTER — Other Ambulatory Visit: Payer: Self-pay | Admitting: Adult Health

## 2013-06-26 ENCOUNTER — Ambulatory Visit
Admission: RE | Admit: 2013-06-26 | Discharge: 2013-06-26 | Disposition: A | Discharge status: Home / Self Care | Payer: Medicare Other | Source: Ambulatory Visit | Attending: Geriatric Medicine | Admitting: Geriatric Medicine

## 2013-06-26 ENCOUNTER — Other Ambulatory Visit: Payer: Self-pay | Admitting: Geriatric Medicine

## 2013-06-26 DIAGNOSIS — R609 Edema, unspecified: Secondary | ICD-10-CM

## 2013-10-12 ENCOUNTER — Telehealth: Payer: Self-pay | Admitting: Internal Medicine

## 2013-10-12 NOTE — Telephone Encounter (Signed)
Spoke with pt daughter. Made her aware it has been over 1 year since pt was last seen and pt will need OV. She will ask pt and see what she states and then call back for appt. Nothing further needed

## 2013-10-12 NOTE — Telephone Encounter (Signed)
Alona Bene returned triage's call.  [name]

## 2013-10-12 NOTE — Telephone Encounter (Signed)
lmomtcb for [employer]

## 2013-10-15 ENCOUNTER — Ambulatory Visit (INDEPENDENT_AMBULATORY_CARE_PROVIDER_SITE_OTHER)
Admission: RE | Admit: 2013-10-15 | Discharge: 2013-10-15 | Disposition: A | Discharge status: Home / Self Care | Payer: Medicare Other | Source: Ambulatory Visit | Attending: Internal Medicine | Admitting: Internal Medicine

## 2013-10-15 ENCOUNTER — Encounter: Payer: Self-pay | Admitting: Internal Medicine

## 2013-10-15 ENCOUNTER — Ambulatory Visit: Payer: Medicare Other | Admitting: Adult Health

## 2013-10-15 ENCOUNTER — Ambulatory Visit (INDEPENDENT_AMBULATORY_CARE_PROVIDER_SITE_OTHER): Payer: Medicare Other | Admitting: Internal Medicine

## 2013-10-15 VITALS — BP 130/58 | HR 77 | Temp 98.5°F

## 2013-10-15 DIAGNOSIS — R05 Cough: Secondary | ICD-10-CM

## 2013-10-15 DIAGNOSIS — R058 Other specified cough: Secondary | ICD-10-CM

## 2013-10-15 DIAGNOSIS — J449 Chronic obstructive pulmonary disease, unspecified: Secondary | ICD-10-CM

## 2013-10-15 DIAGNOSIS — J9610 Chronic respiratory failure, unspecified whether with hypoxia or hypercapnia: Secondary | ICD-10-CM

## 2013-10-15 DIAGNOSIS — R059 Cough, unspecified: Secondary | ICD-10-CM

## 2013-10-15 MED ORDER — BUDESONIDE 0.25 MG/2ML IN SUSP: 0.2500 mg | Freq: Two times a day (BID) | RESPIRATORY_TRACT | Status: AC

## 2013-10-15 MED ORDER — OLMESARTAN MEDOXOMIL 20 MG PO TABS: ORAL_TABLET | ORAL | Status: DC

## 2013-10-15 NOTE — Patient Instructions (Addendum)
Resume benicar 20 mg one half daily instead of cozar  For cough > delsym 2 tsp twice daily is strongest over the counter  Anytime cough/ wheeze prilosec 20 mg Take 30- 60 min before your first and last meals of the day   GERD (REFLUX)  is an extremely common cause of respiratory symptoms, many times with no significant heartburn at all.    It can be treated with medication, but also with lifestyle changes including avoidance of late meals, excessive alcohol, smoking cessation, and avoid fatty foods, chocolate, peppermint, colas, red wine, and acidic juices such as orange juice.  NO MINT OR MENTHOL PRODUCTS SO NO COUGH DROPS  USE SUGARLESS CANDY INSTEAD (jolley ranchers or Stover[employer])  NO OIL BASED VITAMINS - use powdered substitutes.   Please remember to go to the  x-ray department downstairs for your tests - we will call you with the results when they are available.

## 2013-10-15 NOTE — Progress Notes (Addendum)
Subjective:    Patient ID: [name], female    DOB: [birth year], 80 y.o.   MRN: [id number]  Brief patient profile:  89 yowf quit smoking 2002 with onset doe and a mild chronic cough. She subsequently gained 15 pounds and beginning about the middle of 2006 began to have exacerbations with coughing wheezing and shortness of breath which required short course of prednisone to get her back to baseline but requiring Prednisone daily since 3/09.    History of Present Illness    05/28/2011 [facility]  Admitted December 27 2 05/18/2011 for acute COPD exacerbation, urinary tract infection, influenza, acute on chronic respiratory failure. The patient presented initially with acute confusion, hypoxia, and weakness, with fever of 103.5. She was started on Tamiflu. IV antibiotics and IV steroids. She was treated with nebulizer therapy. She was discharged on Keflex, which she has now completed. Urine culture showed Escherichia coli and Klebsiella UTI Since discharge. Patient continues to feel somewhat weak. However, her breathing has improved with decreased cough and congestion. She is currently on a prednisone taper at 30 mg and will be changing over to 20 mg tomorrow. Her daughter is with her today and is concerned that she continues to have a urinary tract infection. Because of complaints of intermittent dysuria and urgency. She denies any fever, chest pain, hemoptysis, or leg swelling.   08/21/2011 Acute OV / NP Patient presents for possible pneumonia. Pt c/o cough with thick green/yellow mucus, sob, wheezing, and weakness x 10 days Also night sweats.  Denies fever, chest pain and chest tightness. Took Doxycycline x  7 days by PCP , finished last dose yesterday  Not helping. No fever or leg swelling Good appetite. No urinary symptoms. Currently on prednisone 20mg  daily  rec Avelox 400mg  daily for 7 days -take with food-sample given  Mucinex DM Twice daily  As needed  Cough/congesiton  Continue on  Prednisone 20mg  daily for now   09/04/2011 f/u ov/Yazlin Ekblad cc mucus still a bit yellow p 8/10 cipro with still thick mucus but breathing better and less need for daytime saba rescue.  N >>add perforomist     05/15/2012 Follow up  2 month follow up COPD - Had recently COPD flare with 1 week ago with l SOB, wheezing .  Called in zpack and steroid burst.   still taking pred taper - on 2 tabs (20mg  ) currently. She is feeling some better . Has good and bad days.  No fever or purulent sputum. No edema. Wt is down.  Good appetite .   Had COPD flare w/ admit in 02/2012 , CXR w/ no acute process At discharge started on ACE inhibitor for HTN.   >d/c ACE inhibitor     10/15/2013 f/u ov/Uno Esau re: new daytime dry cough w/in 2 weeks of starting cozar  Chief Complaint  Patient presents with  . Acute Visit    Pt c/o increased cough x 2 wks- non prod. She also c/o increased SOB and wheezing for the past several days. .   breathing comfortable at rest p rx with neb, some dyshagia but no coughing up any food, no change in 02 needs (being monitored by home pulse ox)  No obvious day to day or daytime variabilty or assoc   cp or chest tightness, subjective wheeze overt sinus or hb symptoms. No unusual exp hx or h/o childhood pna/ asthma or knowledge of premature birth.  Sleeping ok without nocturnal  or early am exacerbation  of respiratory  c/o's or need for noct saba. Also denies any obvious fluctuation of symptoms with weather or environmental changes or other aggravating or alleviating factors except as outlined above   Current Medications, Allergies, Complete Past Medical History, Past Surgical History, Family History, and Social History were reviewed in [facility] electronic medical record.  ROS  The following are not active complaints unless bolded sore throat, dysphagia, dental problems, itching, sneezing,  nasal congestion or excess/ purulent secretions, ear ache,   fever, chills, sweats, unintended  wt loss, pleuritic or exertional cp, hemoptysis,  orthopnea pnd or leg swelling, presyncope, palpitations, heartburn, abdominal pain, anorexia, nausea, vomiting, diarrhea  or change in bowel or urinary habits, change in stools or urine, dysuria,hematuria,  rash, arthralgias, visual complaints, headache, numbness weakness or ataxia or problems with walking or coordination,  change in mood/affect or memory.            Objective:   Physical Exam  GEN: A/Ox3; pleasant , NAD, frail and elderly wf w/c bound   Wt Readings from Last 3 Encounters:  07/18/12 155 lb 13.8 oz (70.7 kg)  05/15/12 145 lb 3.2 oz (65.862 kg)  02/24/12 151 lb (68.493 kg)      HEENT:  [place]/AT,  EACs-clear, TMs-wnl, NOSE-clear, THROAT-clear, no lesions, no postnasal drip or exudate noted.   NECK:  Supple w/ fair ROM; no JVD; normal carotid impulses w/o bruits; no thyromegaly or nodules palpated; no lymphadenopathy.  RESP Kyphotic chest wall, distant BS w/ no wheezing   no accessory muscle use, no dullness to percussion Upper airway pseudowheeze on forced expiration.  CARD:  RRR, no m/r/g  ,no  peripheral edema, pulses intact, no cyanosis or clubbing.  GI:   Soft & nt; nml bowel sounds; no organomegaly or masses detected.   Musco: Warm bil, no deformities or joint swelling noted.   Neuro: alert, no focal deficits noted.    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 The lungs remain hyperaerated.  Cardiomegaly is stable. The bones are osteopenic and multiple  compression deformities of the thoracic spine appear stable    Assessment & Plan:

## 2013-10-16 DIAGNOSIS — R05 Cough: Secondary | ICD-10-CM | POA: Insufficient documentation

## 2013-10-16 DIAGNOSIS — R058 Other specified cough: Secondary | ICD-10-CM | POA: Insufficient documentation

## 2013-10-16 NOTE — Assessment & Plan Note (Signed)
Adequate control on present rx, reviewed > no change in rx needed  (self titrating by pulse ox)

## 2013-10-16 NOTE — Assessment & Plan Note (Addendum)
-   PFTs 09/05/07 FEV1 49% with improvement by 8% ratio 39% and diffusing capacity 58%  - prednisone -dependent since 07/2007 - HFA poor February 04, 2009  - Sinus CT ordered 08/22/2010 >>neg  - Changed to Budesonide 08/22/2010   Actually relatively well compensated at present though still quite severe.  The goal with a chronic steroid dependent illness is always arriving at the lowest effective dose that controls the disease/symptoms and not accepting a set "formula" which is based on statistics or guidelines that don't always take into account patient  variability or the natural hx of the dz in every individual patient, which may well vary over time.  For now therefore I recommend the patient maintain  Ceiling of 20 mg per day and floor of 10 mg per day

## 2013-10-16 NOTE — Progress Notes (Signed)
Quick Note:  Spoke with pt and notified of results per Dr. Wert. Pt verbalized understanding and denied any questions.  ______ 

## 2013-10-16 NOTE — Assessment & Plan Note (Signed)
Classic Upper airway cough syndrome, so named because it's frequently impossible to sort out how much is  CR/sinusitis with freq throat clearing (which can be related to primary GERD)   vs  causing  secondary (" extra esophageal")  GERD from wide swings in gastric pressure that occur with throat clearing, often  promoting self use of mint and menthol lozenges that reduce the lower esophageal sphincter tone and exacerbate the problem further in a cyclical fashion.   These are the same pts (now being labeled as having "irritable larynx syndrome" by some cough centers) who not infrequently have a history of having failed to tolerate ace inhibitors,  dry powder inhalers or biphosphonates or report having atypical reflux symptoms that don't respond to standard doses of PPI , and are easily confused as having aecopd or asthma flares by even experienced allergists/ pulmonologists.   For reasons that may related to vascular permability and nitric oxide pathways but not elevated  bradykinin levels (as seen with her with  ACEi use) losartan in the generic form has been reported now from mulitple sources  to cause a similar pattern of non-specific  upper airway symptoms as seen with acei.   This has not been reported with exposure to the other ARB's to date, so it seems reasonable for now to try either generic diovan or avapro if ARB needed or use an alternative class altogether.  See:  Dewayne Hatch Allergy Asthma Immunol  2008: 101: p [phone]    rec change back to benicar 20 mg one half daily and continue rx for GERD/ suppress cough with delsym

## 2014-09-16 ENCOUNTER — Ambulatory Visit: Payer: Self-pay | Admitting: Podiatry

## 2014-09-28 ENCOUNTER — Ambulatory Visit: Payer: Medicare Other

## 2014-10-30 ENCOUNTER — Other Ambulatory Visit: Payer: Self-pay | Admitting: Internal Medicine

## 2015-01-24 ENCOUNTER — Encounter (HOSPITAL_COMMUNITY): Payer: Self-pay

## 2015-01-24 ENCOUNTER — Emergency Department (HOSPITAL_COMMUNITY)
Admission: EM | Admit: 2015-01-24 | Discharge: 2015-01-24 | Disposition: A | Discharge status: Home / Self Care | Payer: Medicare Other | Attending: Emergency Medicine | Admitting: Emergency Medicine

## 2015-01-24 ENCOUNTER — Emergency Department (HOSPITAL_COMMUNITY): Payer: Medicare Other

## 2015-01-24 DIAGNOSIS — W19XXXA Unspecified fall, initial encounter: Secondary | ICD-10-CM | POA: Diagnosis not present

## 2015-01-24 DIAGNOSIS — S3992XA Unspecified injury of lower back, initial encounter: Secondary | ICD-10-CM | POA: Insufficient documentation

## 2015-01-24 DIAGNOSIS — K219 Gastro-esophageal reflux disease without esophagitis: Secondary | ICD-10-CM | POA: Insufficient documentation

## 2015-01-24 DIAGNOSIS — Z87891 Personal history of nicotine dependence: Secondary | ICD-10-CM | POA: Diagnosis not present

## 2015-01-24 DIAGNOSIS — S51001A Unspecified open wound of right elbow, initial encounter: Secondary | ICD-10-CM | POA: Diagnosis not present

## 2015-01-24 DIAGNOSIS — D649 Anemia, unspecified: Secondary | ICD-10-CM | POA: Diagnosis not present

## 2015-01-24 DIAGNOSIS — Z7902 Long term (current) use of antithrombotics/antiplatelets: Secondary | ICD-10-CM | POA: Insufficient documentation

## 2015-01-24 DIAGNOSIS — Z7982 Long term (current) use of aspirin: Secondary | ICD-10-CM | POA: Diagnosis not present

## 2015-01-24 DIAGNOSIS — Y92002 Bathroom of unspecified non-institutional (private) residence single-family (private) house as the place of occurrence of the external cause: Secondary | ICD-10-CM | POA: Diagnosis not present

## 2015-01-24 DIAGNOSIS — Z880 Allergy status to penicillin: Secondary | ICD-10-CM | POA: Diagnosis not present

## 2015-01-24 DIAGNOSIS — S51011A Laceration without foreign body of right elbow, initial encounter: Secondary | ICD-10-CM

## 2015-01-24 DIAGNOSIS — R410 Disorientation, unspecified: Secondary | ICD-10-CM | POA: Insufficient documentation

## 2015-01-24 DIAGNOSIS — Y9389 Activity, other specified: Secondary | ICD-10-CM | POA: Insufficient documentation

## 2015-01-24 DIAGNOSIS — S199XXA Unspecified injury of neck, initial encounter: Secondary | ICD-10-CM | POA: Insufficient documentation

## 2015-01-24 DIAGNOSIS — Y92009 Unspecified place in unspecified non-institutional (private) residence as the place of occurrence of the external cause: Secondary | ICD-10-CM

## 2015-01-24 DIAGNOSIS — J441 Chronic obstructive pulmonary disease with (acute) exacerbation: Secondary | ICD-10-CM

## 2015-01-24 DIAGNOSIS — Y998 Other external cause status: Secondary | ICD-10-CM | POA: Insufficient documentation

## 2015-01-24 DIAGNOSIS — Z8739 Personal history of other diseases of the musculoskeletal system and connective tissue: Secondary | ICD-10-CM | POA: Diagnosis not present

## 2015-01-24 DIAGNOSIS — Z794 Long term (current) use of insulin: Secondary | ICD-10-CM | POA: Diagnosis not present

## 2015-01-24 DIAGNOSIS — E039 Hypothyroidism, unspecified: Secondary | ICD-10-CM | POA: Insufficient documentation

## 2015-01-24 DIAGNOSIS — Z79899 Other long term (current) drug therapy: Secondary | ICD-10-CM | POA: Diagnosis not present

## 2015-01-24 LAB — CBC
HEMATOCRIT: 31.9 % — AB (ref 36.0–46.0)
Hemoglobin: 10.4 g/dL — ABNORMAL LOW (ref 12.0–15.0)
MCH: 30.3 pg (ref 26.0–34.0)
MCHC: 32.6 g/dL (ref 30.0–36.0)
MCV: 93 fL (ref 78.0–100.0)
PLATELETS: 286 10*3/uL (ref 150–400)
RBC: 3.43 MIL/uL — ABNORMAL LOW (ref 3.87–5.11)
RDW: 13.5 % (ref 11.5–15.5)
WBC: 9.8 10*3/uL (ref 4.0–10.5)

## 2015-01-24 LAB — BASIC METABOLIC PANEL
Anion gap: 11 (ref 5–15)
BUN: 35 mg/dL — AB (ref 6–20)
CALCIUM: 8.8 mg/dL — AB (ref 8.9–10.3)
CO2: 30 mmol/L (ref 22–32)
Chloride: 94 mmol/L — ABNORMAL LOW (ref 101–111)
Creatinine, Ser: 1.09 mg/dL — ABNORMAL HIGH (ref 0.44–1.00)
GFR calc Af Amer: 51 mL/min — ABNORMAL LOW (ref >60)
GFR, EST NON AFRICAN AMERICAN: 44 mL/min — AB (ref >60)
GLUCOSE: 185 mg/dL — AB (ref 65–99)
POTASSIUM: 4.6 mmol/L (ref 3.5–5.1)
SODIUM: 135 mmol/L (ref 135–145)

## 2015-01-24 MED ORDER — METHYLPREDNISOLONE SODIUM SUCC 125 MG IJ SOLR
60.0000 mg | Freq: Once | INTRAMUSCULAR | Status: AC
  Administered 2015-01-24: 60 mg via INTRAVENOUS
  Filled 2015-01-24: qty 2

## 2015-01-24 MED ORDER — IPRATROPIUM-ALBUTEROL 0.5-2.5 (3) MG/3ML IN SOLN
3.0000 mL | Freq: Once | RESPIRATORY_TRACT | Status: AC
  Administered 2015-01-24: 3 mL via RESPIRATORY_TRACT
  Filled 2015-01-24: qty 3

## 2015-01-24 MED ORDER — PREDNISONE 50 MG PO TABS: 50.0000 mg | ORAL_TABLET | Freq: Every day | ORAL | Status: AC

## 2015-01-24 NOTE — Discharge Instructions (Signed)
Fall Prevention and Home Safety Falls cause injuries and can affect all age groups. It is possible to use preventive measures to significantly decrease the likelihood of falls. There are many simple measures which can make your home safer and prevent falls. OUTDOORS  Repair cracks and edges of walkways and driveways.  Remove high doorway thresholds.  Trim shrubbery on the main path into your home.  Have good outside lighting.  Clear walkways of tools, rocks, debris, and clutter.  Check that handrails are not broken and are securely fastened. Both sides of steps should have handrails.  Have leaves, snow, and ice cleared regularly.  Use sand or salt on walkways during winter months.  In the garage, clean up grease or oil spills. BATHROOM  Install night lights.  Install grab bars by the toilet and in the tub and shower.  Use non-skid mats or decals in the tub or shower.  Place a plastic non-slip stool in the shower to sit on, if needed.  Keep floors dry and clean up all water on the floor immediately.  Remove soap buildup in the tub or shower on a regular basis.  Secure bath mats with non-slip, double-sided rug tape.  Remove throw rugs and tripping hazards from the floors. BEDROOMS  Install night lights.  Make sure a bedside light is easy to reach.  Do not use oversized bedding.  Keep a telephone by your bedside.  Have a firm chair with side arms to use for getting dressed.  Remove throw rugs and tripping hazards from the floor. KITCHEN  Keep handles on pots and pans turned toward the center of the stove. Use back burners when possible.  Clean up spills quickly and allow time for drying.  Avoid walking on wet floors.  Avoid hot utensils and knives.  Position shelves so they are not too high or low.  Place commonly used objects within easy reach.  If necessary, use a sturdy step stool with a grab bar when reaching.  Keep electrical cables out of the  way.  Do not use floor polish or wax that makes floors slippery. If you must use wax, use non-skid floor wax.  Remove throw rugs and tripping hazards from the floor. STAIRWAYS  Never leave objects on stairs.  Place handrails on both sides of stairways and use them. Fix any loose handrails. Make sure handrails on both sides of the stairways are as long as the stairs.  Check carpeting to make sure it is firmly attached along stairs. Make repairs to worn or loose carpet promptly.  Avoid placing throw rugs at the top or bottom of stairways, or properly secure the rug with carpet tape to prevent slippage. Get rid of throw rugs, if possible.  Have an electrician put in a light switch at the top and bottom of the stairs. OTHER FALL PREVENTION TIPS 1. Wear low-heel or rubber-soled shoes that are supportive and fit well. Wear closed toe shoes. 2. When using a stepladder, make sure it is fully opened and both spreaders are firmly locked. Do not climb a closed stepladder. 3. Add color or contrast paint or tape to grab bars and handrails in your home. Place contrasting color strips on first and last steps. 4. Learn and use mobility aids as needed. Install an electrical emergency response system. 5. Turn on lights to avoid dark areas. Replace light bulbs that burn out immediately. Get light switches that glow. 6. Arrange furniture to create clear pathways. Keep furniture in the same place.  7. Firmly attach carpet with non-skid or double-sided tape. 8. Eliminate uneven floor surfaces. 9. Select a carpet pattern that does not visually hide the edge of steps. 10. Be aware of all pets. OTHER HOME SAFETY TIPS  Set the water temperature for 120 F (48.8 C).  Keep emergency numbers on or near the telephone.  Keep smoke detectors on every level of the home and near sleeping areas. Document Released: 04/20/2002 Document Revised: 10/30/2011 Document Reviewed: 07/20/2011 [facility] Patient Information  2015 [place], [place]. This information is not intended to replace advice given to you by your health care provider. Make sure you discuss any questions you have with your health care provider.  Skin Tear Care A skin tear is a wound in which the top layer of skin has peeled off. This is a common problem with aging because the skin becomes thinner and more fragile as a person gets older. In addition, some medicines, such as oral corticosteroids, can lead to skin thinning if taken for long periods of time.  A skin tear is often repaired with tape or skin adhesive strips. This keeps the skin that has been peeled off in contact with the healthier skin beneath. Depending on the location of the wound, a bandage (dressing) may be applied over the tape or skin adhesive strips. Sometimes, during the healing process, the skin turns black and dies. Even when this happens, the torn skin acts as a good dressing until the skin underneath gets healthier and repairs itself. HOME CARE INSTRUCTIONS   Change dressings once per day or as directed by your caregiver.  Gently clean the skin tear and the area around the tear using saline solution or mild soap and water.  Do not rub the injured skin dry. Let the area air dry.  Apply petroleum jelly or an antibiotic cream or ointment to keep the tear moist. This will help the wound heal. Do not allow a scab to form.  If the dressing sticks before the next dressing change, moisten it with warm soapy water and gently remove it.  Protect the injured skin until it has healed.  Only take over-the-counter or prescription medicines as directed by your caregiver.  Take showers or baths using warm soapy water. Apply a new dressing after the shower or bath.  Keep all follow-up appointments as directed by your caregiver.  SEEK IMMEDIATE MEDICAL CARE IF:   You have redness, swelling, or increasing pain in the skin tear.  You havepus coming from the skin tear.  You have  chills.  You have a red streak that goes away from the skin tear.  You have a bad smell coming from the tear or dressing.  You have a fever or persistent symptoms for more than 2-3 days.  You have a fever and your symptoms suddenly get worse. MAKE SURE YOU:  Understand these instructions.  Will watch this condition.  Will get help right away if your child is not doing well or gets worse. Document Released: 01/23/2001 Document Revised: 01/23/2012 Document Reviewed: 11/12/2011 [facility] Patient Information 2015 [place], [place]. This information is not intended to replace advice given to you by your health care provider. Make sure you discuss any questions you have with your health care provider.  Chronic Obstructive Pulmonary Disease Chronic obstructive pulmonary disease (COPD) is a common lung condition in which airflow from the lungs is limited. COPD is a general term that can be used to describe many different lung problems that limit airflow, including both chronic  bronchitis and emphysema. If you have COPD, your lung function will probably never return to normal, but there are measures you can take to improve lung function and make yourself feel better.  CAUSES   Smoking (common).   Exposure to secondhand smoke.   Genetic problems.  Chronic inflammatory lung diseases or recurrent infections. SYMPTOMS   Shortness of breath, especially with physical activity.   Deep, persistent (chronic) cough with a large amount of thick mucus.   Wheezing.   Rapid breaths (tachypnea).   Gray or bluish discoloration (cyanosis) of the skin, especially in fingers, toes, or lips.   Fatigue.   Weight loss.   Frequent infections or episodes when breathing symptoms become much worse (exacerbations).   Chest tightness. DIAGNOSIS  Your health care provider will take a medical history and perform a physical examination to make the initial diagnosis. Additional tests for COPD may  include:   Lung (pulmonary) function tests.  Chest X-ray.  CT scan.  Blood tests. TREATMENT  Treatment available to help you feel better when you have COPD includes:   Inhaler and nebulizer medicines. These help manage the symptoms of COPD and make your breathing more comfortable.  Supplemental oxygen. Supplemental oxygen is only helpful if you have a low oxygen level in your blood.   Exercise and physical activity. These are beneficial for nearly all people with COPD. Some people may also benefit from a pulmonary rehabilitation program. HOME CARE INSTRUCTIONS   Take all medicines (inhaled or pills) as directed by your health care provider.  Avoid over-the-counter medicines or cough syrups that dry up your airway (such as antihistamines) and slow down the elimination of secretions unless instructed otherwise by your health care provider.   If you are a smoker, the most important thing that you can do is stop smoking. Continuing to smoke will cause further lung damage and breathing trouble. Ask your health care provider for help with quitting smoking. He or she can direct you to community resources or hospitals that provide support.  Avoid exposure to irritants such as smoke, chemicals, and fumes that aggravate your breathing.  Use oxygen therapy and pulmonary rehabilitation if directed by your health care provider. If you require home oxygen therapy, ask your health care provider whether you should purchase a pulse oximeter to measure your oxygen level at home.   Avoid contact with individuals who have a contagious illness.  Avoid extreme temperature and humidity changes.  Eat healthy foods. Eating smaller, more frequent meals and resting before meals may help you maintain your strength.  Stay active, but balance activity with periods of rest. Exercise and physical activity will help you maintain your ability to do things you want to do.  Preventing infection and  hospitalization is very important when you have COPD. Make sure to receive all the vaccines your health care provider recommends, especially the pneumococcal and influenza vaccines. Ask your health care provider whether you need a pneumonia vaccine.  Learn and use relaxation techniques to manage stress.  Learn and use controlled breathing techniques as directed by your health care provider. Controlled breathing techniques include:   Pursed lip breathing. Start by breathing in (inhaling) through your nose for 1 second. Then, purse your lips as if you were going to whistle and breathe out (exhale) through the pursed lips for 2 seconds.   Diaphragmatic breathing. Start by putting one hand on your abdomen just above your waist. Inhale slowly through your nose. The hand on your abdomen should move  out. Then purse your lips and exhale slowly. You should be able to feel the hand on your abdomen moving in as you exhale.   Learn and use controlled coughing to clear mucus from your lungs. Controlled coughing is a series of short, progressive coughs. The steps of controlled coughing are:  11. Lean your head slightly forward.  12. Breathe in deeply using diaphragmatic breathing.  13. Try to hold your breath for 3 seconds.  14. Keep your mouth slightly open while coughing twice.  15. Spit any mucus out into a tissue.  16. Rest and repeat the steps once or twice as needed. SEEK MEDICAL CARE IF:   You are coughing up more mucus than usual.   There is a change in the color or thickness of your mucus.   Your breathing is more labored than usual.   Your breathing is faster than usual.  SEEK IMMEDIATE MEDICAL CARE IF:   You have shortness of breath while you are resting.   You have shortness of breath that prevents you from:  Being able to talk.   Performing your usual physical activities.   You have chest pain lasting longer than 5 minutes.   Your skin color is more cyanotic than  usual.  You measure low oxygen saturations for longer than 5 minutes with a pulse oximeter. MAKE SURE YOU:   Understand these instructions.  Will watch your condition.  Will get help right away if you are not doing well or get worse. Document Released: 02/07/2005 Document Revised: 09/14/2013 Document Reviewed: 12/25/2012 [facility] Patient Information 2015 [place], [place]. This information is not intended to replace advice given to you by your health care provider. Make sure you discuss any questions you have with your health care provider.  Prednisone tablets What is this medicine? PREDNISONE (PRED ni sone) is a corticosteroid. It is commonly used to treat inflammation of the skin, joints, lungs, and other organs. Common conditions treated include asthma, allergies, and arthritis. It is also used for other conditions, such as blood disorders and diseases of the adrenal glands. This medicine may be used for other purposes; ask your health care provider or pharmacist if you have questions. COMMON BRAND NAME(S): Deltasone, Predone, Sterapred, Sterapred DS What should I tell my health care provider before I take this medicine? They need to know if you have any of these conditions: -Cushing's syndrome -diabetes -glaucoma -heart disease -high blood pressure -infection (especially a virus infection such as chickenpox, cold sores, or herpes) -kidney disease -liver disease -mental illness -myasthenia gravis -osteoporosis -seizures -stomach or intestine problems -thyroid disease -an unusual or allergic reaction to lactose, prednisone, other medicines, foods, dyes, or preservatives -pregnant or trying to get pregnant -breast-feeding How should I use this medicine? Take this medicine by mouth with a glass of water. Follow the directions on the prescription label. Take this medicine with food. If you are taking this medicine once a day, take it in the morning. Do not take more medicine than  you are told to take. Do not suddenly stop taking your medicine because you may develop a severe reaction. Your doctor will tell you how much medicine to take. If your doctor wants you to stop the medicine, the dose may be slowly lowered over time to avoid any side effects. Talk to your pediatrician regarding the use of this medicine in children. Special care may be needed. Overdosage: If you think you have taken too much of this medicine contact a poison control center or  emergency room at once. NOTE: This medicine is only for you. Do not share this medicine with others. What if I miss a dose? If you miss a dose, take it as soon as you can. If it is almost time for your next dose, talk to your doctor or health care professional. You may need to miss a dose or take an extra dose. Do not take double or extra doses without advice. What may interact with this medicine? Do not take this medicine with any of the following medications: -metyrapone -mifepristone This medicine may also interact with the following medications: -aminoglutethimide -amphotericin B -aspirin and aspirin-like medicines -barbiturates -certain medicines for diabetes, like glipizide or glyburide -cholestyramine -cholinesterase inhibitors -cyclosporine -digoxin -diuretics -ephedrine -female hormones, like estrogens and birth control pills -isoniazid -ketoconazole -NSAIDS, medicines for pain and inflammation, like ibuprofen or naproxen -phenytoin -rifampin -toxoids -vaccines -warfarin This list may not describe all possible interactions. Give your health care provider a list of all the medicines, herbs, non-prescription drugs, or dietary supplements you use. Also tell them if you smoke, drink alcohol, or use illegal drugs. Some items may interact with your medicine. What should I watch for while using this medicine? Visit your doctor or health care professional for regular checks on your progress. If you are taking this  medicine over a prolonged period, carry an identification card with your name and address, the type and dose of your medicine, and your doctor's name and address. This medicine may increase your risk of getting an infection. Tell your doctor or health care professional if you are around anyone with measles or chickenpox, or if you develop sores or blisters that do not heal properly. If you are going to have surgery, tell your doctor or health care professional that you have taken this medicine within the last twelve months. Ask your doctor or health care professional about your diet. You may need to lower the amount of salt you eat. This medicine may affect blood sugar levels. If you have diabetes, check with your doctor or health care professional before you change your diet or the dose of your diabetic medicine. What side effects may I notice from receiving this medicine? Side effects that you should report to your doctor or health care professional as soon as possible: -allergic reactions like skin rash, itching or hives, swelling of the face, lips, or tongue -changes in emotions or moods -changes in vision -depressed mood -eye pain -fever or chills, cough, sore throat, pain or difficulty passing urine -increased thirst -swelling of ankles, feet Side effects that usually do not require medical attention (report to your doctor or health care professional if they continue or are bothersome): -confusion, excitement, restlessness -headache -nausea, vomiting -skin problems, acne, thin and shiny skin -trouble sleeping -weight gain This list may not describe all possible side effects. Call your doctor for medical advice about side effects. You may report side effects to FDA at 1-800-FDA-1088. Where should I keep my medicine? Keep out of the reach of children. Store at room temperature between 15 and 30 degrees C (59 and 86 degrees F). Protect from light. Keep container tightly closed. Throw away  any unused medicine after the expiration date. NOTE: This sheet is a summary. It may not cover all possible information. If you have questions about this medicine, talk to your doctor, pharmacist, or health care provider.  2015, [employer]. (2010-12-14 10:57:14)

## 2015-01-24 NOTE — ED Provider Notes (Signed)
CSN: [id number]     Arrival date & time 01/24/15  0216 History   This chart was scribed for Michelle Booze, MD by Arlan Organ, ED Scribe. This patient was seen in room B17C/B17C and the patient's care was started 3:35 AM.   Chief Complaint  Patient presents with  . Fall   The history is provided by the patient and the EMS personnel. No language interpreter was used.    HPI Comments: [name] brought in by EMS is a 81 y.o. female with a PMHx of osteoporosis, hyperlipidemia, and COPD who presents to the Emergency Department here after an unwitnessed fall sustained just prior to arrival. Pt states she got up to use the bathroom when she "ended up on the floor". States she is unable to recall events of the fall or how she fell. She now c/o constant, ongoing back pain and neck pain. Skin tear also noted to the R elbow. No aggravating or alleviating factors at this time. No recent fever, chills, nausea, vomiting, chest pain, or shortness of breath. No weakness, loss of sensation, or numbness.  Past Medical History  Diagnosis Date  . Osteoporosis     9 Compression Fx with slt cord deformity   . Other abnormal glucose   . Hyperlipidemia   . Esophageal reflux   . COPD (chronic obstructive pulmonary disease)     On home O2 and daily Prednisone since march 2009. PFTs 09/05/07 FEV1 49% with improvement by 8% ratio 39% and diffusing capacity 58%    . Hypothyroidism   . Tobacco abuse, in remission quit 2002  . Atelectasis of right lung     chronic rt lower lung seen on cT 2010  . Chronic respiratory failure    Past Surgical History  Procedure Laterality Date  . Cholecystectomy    . Ankle fracture surgery      left 10/12/2008  . Colon surgery      stents to arteries in abdomen   Family History  Problem Relation Age of Onset  . Emphysema Brother     multiple  . Emphysema Father   . Diabetes Mother   . Diabetes Sister       X37  . Diabetes Brother       X2  . Congestive Heart Failure  Sister   . Cancer Father     GI    Social History  Substance Use Topics  . Smoking status: Former Smoker -- 1.50 packs/day for 50 years    Types: Cigarettes    Quit date: 05/14/2000  . Smokeless tobacco: None  . Alcohol Use: No   OB History    No data available     Review of Systems  Constitutional: Negative for fever and chills.  Respiratory: Positive for wheezing. Negative for cough and shortness of breath.   Cardiovascular: Negative for chest pain.  Gastrointestinal: Negative for nausea, vomiting, abdominal pain and diarrhea.  Musculoskeletal: Positive for back pain and neck pain.  Neurological: Negative for weakness, numbness and headaches.  Psychiatric/Behavioral: Negative for confusion.  All other systems reviewed and are negative.     Allergies  Cefdinir; Levofloxacin; Statins; Sulfonamide derivatives; and Penicillins  Home Medications   Prior to Admission medications   Medication Sig Start Date End Date Taking? Authorizing Provider  albuterol (PROVENTIL HFA) 108 (90 BASE) MCG/ACT inhaler Inhale 2 puffs into the lungs every 4 (four) hours as needed for wheezing. For shortness of breath 06/16/12   Michelle Cowden, MD  albuterol (  PROVENTIL) (2.5 MG/3ML) 0.083% nebulizer solution USE 1 VIAL IN NEBULIZER EVERY 4 HOURS AS NEEDED FOR WHEEZING 05/13/13   Michelle Cowden, MD  antiseptic oral rinse (BIOTENE) LIQD 15 mLs by Mouth Rinse route as needed for dry mouth.    Historical Provider, MD  aspirin EC 81 MG tablet Take 81 mg by mouth daily.    Historical Provider, MD  azithromycin (ZITHROMAX) 250 MG tablet Take as directed 10/12/13   Historical Provider, MD  BENICAR 20 MG tablet TAKE 1/2 TABLET BY MOUTH ONCE DAILY. 11/01/14   Michelle Cowden, MD  budesonide (PULMICORT) 0.25 MG/2ML nebulizer solution Take 2 mLs (0.25 mg total) by nebulization 2 (two) times daily. 10/15/13   Michelle Cowden, MD  clopidogrel (PLAVIX) 75 MG tablet Take 75 mg by mouth daily.      Historical Provider, MD   diazepam (VALIUM) 5 MG tablet Take 2.5 mg by mouth 2 (two) times daily as needed.  10/12/10   Tammy S Parrett, NP  ferrous sulfate 325 (65 FE) MG EC tablet Take 325 mg by mouth daily.  10/12/10   Tammy S Parrett, NP  furosemide (LASIX) 40 MG tablet Take 1 and 1/2 tabs by mouth once daily 10/12/10   Tammy S Parrett, NP  guaiFENesin (MUCINEX) 600 MG 12 hr tablet 1-2 every 12 hour as needed for cough and congestion 09/04/11   Michelle Cowden, MD  insulin aspart (NOVOLOG) 100 UNIT/ML injection Inject 0-10 Units into the skin daily before lunch. Per sliding scale.    Historical Provider, MD  insulin lispro protamine-insulin lispro (HUMALOG 75/25) (75-25) 100 UNIT/ML SUSP 34 units with breakfast, 10 units with dinner    Tammy S Parrett, NP  levothyroxine (SYNTHROID, LEVOTHROID) 88 MCG tablet Take 88 mcg by mouth daily.      Historical Provider, MD  Magnesium 250 MG TABS Take 1 tablet by mouth daily.      Historical Provider, MD  omeprazole (PRILOSEC) 20 MG capsule Take 1 capsule by mouth 2 (two) times daily before a meal. 10/18/10   Historical Provider, MD  Potassium Gluconate 595 MG CAPS 1 tablet by mouth once daily    Historical Provider, MD  predniSONE (DELTASONE) 10 MG tablet Take 40 mg daily for 3 days, then 30 mg daily for 3 days, then 20 mg daily for 3 days then resume 10 mg daily (home dose). 07/22/12   Cristal Ford, MD  senna (SENOKOT) 8.6 MG tablet Take 2 tablets by mouth 2 (two) times daily.     Historical Provider, MD  sertraline (ZOLOFT) 50 MG tablet Take 75 mg by mouth daily.     Historical Provider, MD  sodium chloride (OCEAN) 0.65 % nasal spray Place 2 sprays into the nose 2 (two) times daily as needed for congestion. 10/12/10   Tammy S Parrett, NP   Triage Vitals: Pulse 85  Temp(Src) 98.5 F (36.9 C)  Resp 21  Ht 5\' 2"  (1.575 m)  Wt 145 lb (65.772 kg)  BMI 26.51 kg/m2  SpO2 96%   Physical Exam  Constitutional: She is oriented to person, place, and time. She appears well-developed and  well-nourished. No distress.  HENT:  Head: Normocephalic and atraumatic.  Eyes: EOM are normal. Pupils are equal, round, and reactive to light.  Neck: Normal range of motion. No JVD present.  Cardiovascular: Normal rate, regular rhythm and normal heart sounds.   No murmur heard. Pulmonary/Chest: Effort normal. She has wheezes. She has no rales. She exhibits no tenderness.  Decreased air movement A few scattered wheezes  Abdominal: Soft. Bowel sounds are normal. She exhibits no distension and no mass. There is no tenderness.  Musculoskeletal: Normal range of motion. She exhibits edema and tenderness.  Skin tear to R elbow  2+ pitting edema of ankles and feet pain. Mild to moderate tenderness over medial aspect of the right ankle without instability or deformity.  Lymphadenopathy:    She has no cervical adenopathy.  Neurological: She is alert and oriented to person, place, and time. No cranial nerve deficit. She exhibits normal muscle tone.  Skin: Skin is warm and dry. No rash noted.  Psychiatric: She has a normal mood and affect. Judgment normal.  Nursing note and vitals reviewed.   ED Course  Procedures (including critical care time)  DIAGNOSTIC STUDIES: Oxygen Saturation is 96% on RA, adequate by my interpretation.    COORDINATION OF CARE: 3:43 AM- Will give breathing treatment. Will order CT head without contrast, CT cervical spine without contrast, CXR, BMP, CBC, and EKG. Discussed treatment plan with pt at bedside and pt agreed to plan.     Labs Review Results for orders placed or performed during the hospital encounter of 01/24/15  Basic metabolic panel  Result Value Ref Range   Sodium 135 135 - 145 mmol/L   Potassium 4.6 3.5 - 5.1 mmol/L   Chloride 94 (L) 101 - 111 mmol/L   CO2 30 22 - 32 mmol/L   Glucose, Bld 185 (H) 65 - 99 mg/dL   BUN 35 (H) 6 - 20 mg/dL   Creatinine, Ser 1.61 (H) 0.44 - 1.00 mg/dL   Calcium 8.8 (L) 8.9 - 10.3 mg/dL   GFR calc non Af Amer 44 (L)  >60 mL/min   GFR calc Af Amer 51 (L) >60 mL/min   Anion gap 11 5 - 15  CBC  Result Value Ref Range   WBC 9.8 4.0 - 10.5 K/uL   RBC 3.43 (L) 3.87 - 5.11 MIL/uL   Hemoglobin 10.4 (L) 12.0 - 15.0 g/dL   HCT 09.6 (L) 04.5 - 40.9 %   MCV 93.0 78.0 - 100.0 fL   MCH 30.3 26.0 - 34.0 pg   MCHC 32.6 30.0 - 36.0 g/dL   RDW 81.1 91.4 - 78.2 %   Platelets 286 150 - 400 K/uL    Imaging Review Dg Chest 2 View  01/24/2015   CLINICAL DATA:  81 year old female with fall. History of atrial fibrillation  EXAM: CHEST  2 VIEW  COMPARISON:  Radiograph dated 10/15/2013  FINDINGS: Evaluation is limited due to patient's body habitus.  Two views of the chest do not demonstrate any focal consolidation, or pneumothorax. There is a sharp left costophrenic angle. The right costophrenic angle is not included in the image. Stable cardiomegaly. Osteopenia with degenerative changes of the spine and shoulders. Multilevel mid thoracic compression fractures, age indeterminate. Clinical correlation is recommended.  IMPRESSION: No focal consolidation .  No interval change.   Electronically Signed   By: Elgie Collard M.D.   On: 01/24/2015 04:00   Ct Head Wo Contrast  01/24/2015   CLINICAL DATA:  81 year old female post fall, hitting head on floor. Confusion and disorientation. Neck pain.  EXAM: CT HEAD WITHOUT CONTRAST  CT CERVICAL SPINE WITHOUT CONTRAST  TECHNIQUE: Multidetector CT imaging of the head and cervical spine was performed following the standard protocol without intravenous contrast. Multiplanar CT image reconstructions of the cervical spine were also generated.  COMPARISON:  06/27/2008  FINDINGS: CT HEAD FINDINGS  Generalized cerebral atrophy, unchanged. Progressive chronic small vessel ischemic change from prior.No intracranial hemorrhage, mass effect, or midline shift. No hydrocephalus. The basilar cisterns are patent. No evidence of territorial infarct. No intracranial fluid collection. Calvarium is intact.  Opacification with fluid level involving the right maxillary sinus. Bubbly debris in the sphenoid and right frontal sinus. Mastoid air cells are well aerated.  CT CERVICAL SPINE FINDINGS  Motion artifact from C3 through C5 region related to patient's snoring. The dens is intact. No evidence of fracture allowing for motion artifact. Vertebral body heights are maintained. Mild disc space narrowing and endplate spurring at C5-C6. Scattered facet arthropathy. Atherosclerosis of the carotid vasculature. No definite prevertebral soft tissue edema.  IMPRESSION: 1. No acute intracranial abnormality. Progressive chronic small vessel ischemic change compared to prior. 2. Motion through the cervical spine related to patient snoring limiting assessment. Allowing for limitations, no acute fracture or subluxation. 3. Paranasal sinus debris with fluid level in the right maxillary sinus and bubbly debris in the right frontal and sphenoid sinus. This may reflect acute sinusitis.   Electronically Signed   By: Rubye Oaks M.D.   On: 01/24/2015 05:35   Ct Cervical Spine Wo Contrast  01/24/2015   CLINICAL DATA:  81 year old female post fall, hitting head on floor. Confusion and disorientation. Neck pain.  EXAM: CT HEAD WITHOUT CONTRAST  CT CERVICAL SPINE WITHOUT CONTRAST  TECHNIQUE: Multidetector CT imaging of the head and cervical spine was performed following the standard protocol without intravenous contrast. Multiplanar CT image reconstructions of the cervical spine were also generated.  COMPARISON:  06/27/2008  FINDINGS: CT HEAD FINDINGS  Generalized cerebral atrophy, unchanged. Progressive chronic small vessel ischemic change from prior.No intracranial hemorrhage, mass effect, or midline shift. No hydrocephalus. The basilar cisterns are patent. No evidence of territorial infarct. No intracranial fluid collection. Calvarium is intact. Opacification with fluid level involving the right maxillary sinus. Bubbly debris in the  sphenoid and right frontal sinus. Mastoid air cells are well aerated.  CT CERVICAL SPINE FINDINGS  Motion artifact from C3 through C5 region related to patient's snoring. The dens is intact. No evidence of fracture allowing for motion artifact. Vertebral body heights are maintained. Mild disc space narrowing and endplate spurring at C5-C6. Scattered facet arthropathy. Atherosclerosis of the carotid vasculature. No definite prevertebral soft tissue edema.  IMPRESSION: 1. No acute intracranial abnormality. Progressive chronic small vessel ischemic change compared to prior. 2. Motion through the cervical spine related to patient snoring limiting assessment. Allowing for limitations, no acute fracture or subluxation. 3. Paranasal sinus debris with fluid level in the right maxillary sinus and bubbly debris in the right frontal and sphenoid sinus. This may reflect acute sinusitis.   Electronically Signed   By: Rubye Oaks M.D.   On: 01/24/2015 05:35   I have personally reviewed and evaluated these images and lab results as part of my medical decision-making.   EKG Interpretation   Date/Time:  Monday January 24 2015 02:47:19 EDT Ventricular Rate:  111 PR Interval:  109 QRS Duration: 144 QT Interval:  432 QTC Calculation: 587 R Axis:   -97 Text Interpretation:  Sinus tachycardia Multiple premature complexes, vent   RBBB and LAFB When compared with ECG of 07/18/2012, No significant change  was found Confirmed by [facility]  MD, Zandon Talton ([id number]) on 01/24/2015 3:01:10 AM      MDM   Final diagnoses:  Fall at home, initial encounter  COPD exacerbation  Skin tear of elbow without complication, right, initial encounter  Normochromic normocytic anemia    Fall at home without obvious injury other than skin tear of the right elbow. She was noted to be wheezing and apparently has had some increase in breathing problems over the last several days. She is given a dose of methylprednisolone and given a nebulizer  treatment albuterol with ipratropium with significant improvement. Chest x-ray showed no evidence of pneumonia. CT of head and cervical spine were negative for acute process. X-rays of the right ankle showed no fracture. She is discharged with prescription for prednisone.  I personally performed the services described in this documentation, which was scribed in my presence. The recorded information has been reviewed and is accurate.     Michelle Booze, MD 01/24/15 0700

## 2015-01-24 NOTE — ED Notes (Signed)
Pt states that she got up to got to the bathroom and then ended up in the floor, pt does not remember what happened, if she tripped or LOC. No c/o pain in back or neck , skin tear to R elbow. Pt has hx of COPD, wheezing in all lobes. PTA received 5 albuterol and 0.5 atrovent.

## 2015-05-15 DIAGNOSIS — J9690 Respiratory failure, unspecified, unspecified whether with hypoxia or hypercapnia: Secondary | ICD-10-CM

## 2015-05-15 HISTORY — DX: Respiratory failure, unspecified, unspecified whether with hypoxia or hypercapnia: J96.90

## 2015-05-25 ENCOUNTER — Inpatient Hospital Stay (HOSPITAL_COMMUNITY)
Admission: EM | Admit: 2015-05-25 | Discharge: 2015-06-15 | DRG: 190 | Disposition: E | Payer: Medicare Other | Attending: Internal Medicine | Admitting: Internal Medicine

## 2015-05-25 ENCOUNTER — Emergency Department (HOSPITAL_COMMUNITY): Payer: Medicare Other

## 2015-05-25 ENCOUNTER — Encounter (HOSPITAL_COMMUNITY): Payer: Self-pay | Admitting: Vascular Surgery

## 2015-05-25 DIAGNOSIS — Z7982 Long term (current) use of aspirin: Secondary | ICD-10-CM | POA: Diagnosis not present

## 2015-05-25 DIAGNOSIS — Z794 Long term (current) use of insulin: Secondary | ICD-10-CM | POA: Diagnosis not present

## 2015-05-25 DIAGNOSIS — J189 Pneumonia, unspecified organism: Secondary | ICD-10-CM | POA: Diagnosis not present

## 2015-05-25 DIAGNOSIS — I2699 Other pulmonary embolism without acute cor pulmonale: Secondary | ICD-10-CM

## 2015-05-25 DIAGNOSIS — J9602 Acute respiratory failure with hypercapnia: Secondary | ICD-10-CM | POA: Diagnosis present

## 2015-05-25 DIAGNOSIS — E039 Hypothyroidism, unspecified: Secondary | ICD-10-CM | POA: Diagnosis present

## 2015-05-25 DIAGNOSIS — R0602 Shortness of breath: Secondary | ICD-10-CM | POA: Diagnosis present

## 2015-05-25 DIAGNOSIS — J441 Chronic obstructive pulmonary disease with (acute) exacerbation: Secondary | ICD-10-CM | POA: Diagnosis present

## 2015-05-25 DIAGNOSIS — E785 Hyperlipidemia, unspecified: Secondary | ICD-10-CM | POA: Diagnosis present

## 2015-05-25 DIAGNOSIS — E872 Acidosis, unspecified: Secondary | ICD-10-CM | POA: Diagnosis present

## 2015-05-25 DIAGNOSIS — K56609 Unspecified intestinal obstruction, unspecified as to partial versus complete obstruction: Secondary | ICD-10-CM

## 2015-05-25 DIAGNOSIS — E1122 Type 2 diabetes mellitus with diabetic chronic kidney disease: Secondary | ICD-10-CM | POA: Diagnosis present

## 2015-05-25 DIAGNOSIS — E860 Dehydration: Secondary | ICD-10-CM | POA: Diagnosis present

## 2015-05-25 DIAGNOSIS — J9622 Acute and chronic respiratory failure with hypercapnia: Secondary | ICD-10-CM | POA: Diagnosis present

## 2015-05-25 DIAGNOSIS — Z66 Do not resuscitate: Secondary | ICD-10-CM | POA: Diagnosis present

## 2015-05-25 DIAGNOSIS — Z7902 Long term (current) use of antithrombotics/antiplatelets: Secondary | ICD-10-CM

## 2015-05-25 DIAGNOSIS — I10 Essential (primary) hypertension: Secondary | ICD-10-CM | POA: Diagnosis present

## 2015-05-25 DIAGNOSIS — J9601 Acute respiratory failure with hypoxia: Secondary | ICD-10-CM | POA: Diagnosis present

## 2015-05-25 DIAGNOSIS — J9621 Acute and chronic respiratory failure with hypoxia: Secondary | ICD-10-CM

## 2015-05-25 DIAGNOSIS — W1830XA Fall on same level, unspecified, initial encounter: Secondary | ICD-10-CM | POA: Diagnosis present

## 2015-05-25 DIAGNOSIS — E1165 Type 2 diabetes mellitus with hyperglycemia: Secondary | ICD-10-CM | POA: Diagnosis present

## 2015-05-25 DIAGNOSIS — J123 Human metapneumovirus pneumonia: Secondary | ICD-10-CM | POA: Diagnosis present

## 2015-05-25 DIAGNOSIS — I248 Other forms of acute ischemic heart disease: Secondary | ICD-10-CM | POA: Diagnosis present

## 2015-05-25 DIAGNOSIS — T402X5A Adverse effect of other opioids, initial encounter: Secondary | ICD-10-CM | POA: Diagnosis present

## 2015-05-25 DIAGNOSIS — I130 Hypertensive heart and chronic kidney disease with heart failure and stage 1 through stage 4 chronic kidney disease, or unspecified chronic kidney disease: Secondary | ICD-10-CM | POA: Diagnosis present

## 2015-05-25 DIAGNOSIS — R7989 Other specified abnormal findings of blood chemistry: Secondary | ICD-10-CM

## 2015-05-25 DIAGNOSIS — E1100 Type 2 diabetes mellitus with hyperosmolarity without nonketotic hyperglycemic-hyperosmolar coma (NKHHC): Secondary | ICD-10-CM | POA: Diagnosis present

## 2015-05-25 DIAGNOSIS — K5903 Drug induced constipation: Secondary | ICD-10-CM | POA: Diagnosis present

## 2015-05-25 DIAGNOSIS — E870 Hyperosmolality and hypernatremia: Secondary | ICD-10-CM | POA: Diagnosis not present

## 2015-05-25 DIAGNOSIS — G894 Chronic pain syndrome: Secondary | ICD-10-CM | POA: Diagnosis present

## 2015-05-25 DIAGNOSIS — I5032 Chronic diastolic (congestive) heart failure: Secondary | ICD-10-CM | POA: Diagnosis present

## 2015-05-25 DIAGNOSIS — J440 Chronic obstructive pulmonary disease with acute lower respiratory infection: Principal | ICD-10-CM | POA: Diagnosis present

## 2015-05-25 DIAGNOSIS — I452 Bifascicular block: Secondary | ICD-10-CM | POA: Diagnosis present

## 2015-05-25 DIAGNOSIS — N189 Chronic kidney disease, unspecified: Secondary | ICD-10-CM | POA: Diagnosis present

## 2015-05-25 DIAGNOSIS — Z87891 Personal history of nicotine dependence: Secondary | ICD-10-CM

## 2015-05-25 DIAGNOSIS — K5900 Constipation, unspecified: Secondary | ICD-10-CM | POA: Diagnosis present

## 2015-05-25 DIAGNOSIS — Z7952 Long term (current) use of systemic steroids: Secondary | ICD-10-CM | POA: Diagnosis not present

## 2015-05-25 DIAGNOSIS — M810 Age-related osteoporosis without current pathological fracture: Secondary | ICD-10-CM | POA: Diagnosis present

## 2015-05-25 DIAGNOSIS — R000 Tachycardia, unspecified: Secondary | ICD-10-CM | POA: Diagnosis present

## 2015-05-25 DIAGNOSIS — R739 Hyperglycemia, unspecified: Secondary | ICD-10-CM | POA: Diagnosis present

## 2015-05-25 DIAGNOSIS — N179 Acute kidney failure, unspecified: Secondary | ICD-10-CM | POA: Diagnosis present

## 2015-05-25 DIAGNOSIS — S20219A Contusion of unspecified front wall of thorax, initial encounter: Secondary | ICD-10-CM | POA: Diagnosis present

## 2015-05-25 DIAGNOSIS — R778 Other specified abnormalities of plasma proteins: Secondary | ICD-10-CM | POA: Diagnosis present

## 2015-05-25 DIAGNOSIS — M62838 Other muscle spasm: Secondary | ICD-10-CM | POA: Diagnosis not present

## 2015-05-25 DIAGNOSIS — R001 Bradycardia, unspecified: Secondary | ICD-10-CM | POA: Diagnosis not present

## 2015-05-25 DIAGNOSIS — K5909 Other constipation: Secondary | ICD-10-CM | POA: Diagnosis not present

## 2015-05-25 DIAGNOSIS — R400 Somnolence: Secondary | ICD-10-CM | POA: Diagnosis present

## 2015-05-25 DIAGNOSIS — S20212A Contusion of left front wall of thorax, initial encounter: Secondary | ICD-10-CM | POA: Diagnosis present

## 2015-05-25 DIAGNOSIS — Z9981 Dependence on supplemental oxygen: Secondary | ICD-10-CM

## 2015-05-25 HISTORY — DX: Respiratory failure, unspecified, unspecified whether with hypoxia or hypercapnia: J96.90

## 2015-05-25 HISTORY — DX: Anemia, unspecified: D64.9

## 2015-05-25 HISTORY — DX: Unspecified osteoarthritis, unspecified site: M19.90

## 2015-05-25 HISTORY — DX: Essential (primary) hypertension: I10

## 2015-05-25 HISTORY — DX: Peripheral vascular disease, unspecified: I73.9

## 2015-05-25 LAB — COMPREHENSIVE METABOLIC PANEL
ALK PHOS: 50 U/L (ref 38–126)
ALT: 17 U/L (ref 14–54)
ANION GAP: 10 (ref 5–15)
AST: 24 U/L (ref 15–41)
Albumin: 2.9 g/dL — ABNORMAL LOW (ref 3.5–5.0)
BUN: 54 mg/dL — ABNORMAL HIGH (ref 6–20)
CALCIUM: 8.2 mg/dL — AB (ref 8.9–10.3)
CO2: 26 mmol/L (ref 22–32)
CREATININE: 1.22 mg/dL — AB (ref 0.44–1.00)
Chloride: 100 mmol/L — ABNORMAL LOW (ref 101–111)
GFR, EST AFRICAN AMERICAN: 44 mL/min — AB (ref >60)
GFR, EST NON AFRICAN AMERICAN: 38 mL/min — AB (ref >60)
Glucose, Bld: 269 mg/dL — ABNORMAL HIGH (ref 65–99)
Potassium: 4.8 mmol/L (ref 3.5–5.1)
SODIUM: 136 mmol/L (ref 135–145)
TOTAL PROTEIN: 6 g/dL — AB (ref 6.5–8.1)
Total Bilirubin: 0.6 mg/dL (ref 0.3–1.2)

## 2015-05-25 LAB — CBC
HCT: 31.9 % — ABNORMAL LOW (ref 36.0–46.0)
HEMOGLOBIN: 9.8 g/dL — AB (ref 12.0–15.0)
MCH: 29.1 pg (ref 26.0–34.0)
MCHC: 30.7 g/dL (ref 30.0–36.0)
MCV: 94.7 fL (ref 78.0–100.0)
Platelets: 249 10*3/uL (ref 150–400)
RBC: 3.37 MIL/uL — AB (ref 3.87–5.11)
RDW: 14.5 % (ref 11.5–15.5)
WBC: 10.1 10*3/uL (ref 4.0–10.5)

## 2015-05-25 LAB — I-STAT ARTERIAL BLOOD GAS, ED
ACID-BASE EXCESS: 2 mmol/L (ref 0.0–2.0)
Acid-Base Excess: 3 mmol/L — ABNORMAL HIGH (ref 0.0–2.0)
Bicarbonate: 29.7 mEq/L — ABNORMAL HIGH (ref 20.0–24.0)
Bicarbonate: 28.8 mEq/L — ABNORMAL HIGH (ref 20.0–24.0)
O2 SAT: 100 %
O2 Saturation: 96 %
PH ART: 7.301 — AB (ref 7.350–7.450)
TCO2: 31 mmol/L (ref 0–100)
TCO2: 30 mmol/L (ref 0–100)
pCO2 arterial: 60.2 mmHg (ref 35.0–45.0)
pCO2 arterial: 51.1 mmHg — ABNORMAL HIGH (ref 35.0–45.0)
pH, Arterial: 7.359 (ref 7.350–7.450)
pO2, Arterial: 256 mmHg — ABNORMAL HIGH (ref 80.0–100.0)
pO2, Arterial: 89 mmHg (ref 80.0–100.0)

## 2015-05-25 LAB — I-STAT TROPONIN, ED: TROPONIN I, POC: 0.21 ng/mL — AB (ref 0.00–0.08)

## 2015-05-25 LAB — PROTIME-INR
INR: 1.06 (ref 0.00–1.49)
PROTHROMBIN TIME: 14 s (ref 11.6–15.2)

## 2015-05-25 LAB — BRAIN NATRIURETIC PEPTIDE: B NATRIURETIC PEPTIDE 5: 557.4 pg/mL — AB (ref 0.0–100.0)

## 2015-05-25 LAB — I-STAT CG4 LACTIC ACID, ED: Lactic Acid, Venous: 2.02 mmol/L (ref 0.5–2.0)

## 2015-05-25 LAB — TROPONIN I
TROPONIN I: 0.29 ng/mL — AB (ref <0.031)
TROPONIN I: 0.34 ng/mL — AB (ref <0.031)

## 2015-05-25 LAB — MAGNESIUM: Magnesium: 2.9 mg/dL — ABNORMAL HIGH (ref 1.7–2.4)

## 2015-05-25 MED ORDER — ASPIRIN EC 325 MG PO TBEC
325.0000 mg | DELAYED_RELEASE_TABLET | Freq: Every day | ORAL | Status: DC
  Administered 2015-05-25 – 2015-05-30 (×4): 325 mg via ORAL
  Filled 2015-05-25 (×4): qty 1

## 2015-05-25 MED ORDER — SODIUM CHLORIDE 0.9 % IJ SOLN
3.0000 mL | Freq: Two times a day (BID) | INTRAMUSCULAR | Status: DC
  Administered 2015-05-25 – 2015-05-28 (×5): 3 mL via INTRAVENOUS

## 2015-05-25 MED ORDER — FUROSEMIDE 10 MG/ML IJ SOLN
60.0000 mg | Freq: Once | INTRAMUSCULAR | Status: AC
  Administered 2015-05-25: 60 mg via INTRAVENOUS
  Filled 2015-05-25: qty 6

## 2015-05-25 MED ORDER — ALBUTEROL SULFATE (2.5 MG/3ML) 0.083% IN NEBU
10.0000 mg | INHALATION_SOLUTION | Freq: Once | RESPIRATORY_TRACT | Status: AC
  Administered 2015-05-25: 10 mg via RESPIRATORY_TRACT

## 2015-05-25 MED ORDER — BIOTENE DRY MOUTH MT LIQD: 15.0000 mL | OROMUCOSAL | Status: DC | PRN

## 2015-05-25 MED ORDER — ONDANSETRON HCL 4 MG/2ML IJ SOLN: 4.0000 mg | Freq: Four times a day (QID) | INTRAMUSCULAR | Status: DC | PRN

## 2015-05-25 MED ORDER — IPRATROPIUM BROMIDE 0.02 % IN SOLN
0.5000 mg | Freq: Once | RESPIRATORY_TRACT | Status: AC
  Administered 2015-05-25: 0.5 mg via RESPIRATORY_TRACT
  Filled 2015-05-25: qty 2.5

## 2015-05-25 MED ORDER — HEPARIN SODIUM (PORCINE) 5000 UNIT/ML IJ SOLN
5000.0000 [IU] | Freq: Three times a day (TID) | INTRAMUSCULAR | Status: DC
  Administered 2015-05-25 – 2015-05-30 (×16): 5000 [IU] via SUBCUTANEOUS
  Filled 2015-05-25 (×16): qty 1

## 2015-05-25 MED ORDER — DICLOFENAC SODIUM 1 % TD GEL
2.0000 g | Freq: Four times a day (QID) | TRANSDERMAL | Status: DC
  Administered 2015-05-25 – 2015-05-30 (×19): 2 g via TOPICAL
  Filled 2015-05-25 (×2): qty 100

## 2015-05-25 MED ORDER — ONDANSETRON HCL 4 MG PO TABS: 4.0000 mg | ORAL_TABLET | Freq: Four times a day (QID) | ORAL | Status: DC | PRN

## 2015-05-25 MED ORDER — FUROSEMIDE 40 MG PO TABS: 60.0000 mg | ORAL_TABLET | Freq: Every day | ORAL | Status: DC

## 2015-05-25 MED ORDER — SODIUM CHLORIDE 0.9 % IJ SOLN: 3.0000 mL | INTRAMUSCULAR | Status: DC | PRN

## 2015-05-25 MED ORDER — POLYETHYLENE GLYCOL 3350 17 G PO PACK
34.0000 g | PACK | Freq: Every day | ORAL | Status: DC
  Administered 2015-05-25 – 2015-05-28 (×2): 34 g via ORAL
  Filled 2015-05-25 (×2): qty 2

## 2015-05-25 MED ORDER — IRBESARTAN 75 MG PO TABS
75.0000 mg | ORAL_TABLET | Freq: Every day | ORAL | Status: DC
  Administered 2015-05-25 – 2015-05-30 (×4): 75 mg via ORAL
  Filled 2015-05-25 (×9): qty 1

## 2015-05-25 MED ORDER — LIDOCAINE 5 % EX PTCH
1.0000 | MEDICATED_PATCH | CUTANEOUS | Status: DC
  Administered 2015-05-25 – 2015-05-30 (×6): 1 via TRANSDERMAL
  Filled 2015-05-25 (×7): qty 1

## 2015-05-25 MED ORDER — SODIUM CHLORIDE 0.9 % IV SOLN: 250.0000 mL | INTRAVENOUS | Status: DC | PRN

## 2015-05-25 MED ORDER — GUAIFENESIN ER 600 MG PO TB12
1200.0000 mg | ORAL_TABLET | Freq: Two times a day (BID) | ORAL | Status: DC
  Administered 2015-05-25 – 2015-05-26 (×2): 1200 mg via ORAL
  Filled 2015-05-25 (×2): qty 2

## 2015-05-25 MED ORDER — DOXYCYCLINE HYCLATE 100 MG IV SOLR
100.0000 mg | Freq: Two times a day (BID) | INTRAVENOUS | Status: DC
  Administered 2015-05-25 – 2015-05-26 (×2): 100 mg via INTRAVENOUS
  Filled 2015-05-25 (×4): qty 100

## 2015-05-25 MED ORDER — SODIUM CHLORIDE 0.9 % IJ SOLN
3.0000 mL | Freq: Two times a day (BID) | INTRAMUSCULAR | Status: DC
  Administered 2015-05-25 – 2015-05-30 (×9): 3 mL via INTRAVENOUS

## 2015-05-25 MED ORDER — METHYLPREDNISOLONE SODIUM SUCC 125 MG IJ SOLR
60.0000 mg | Freq: Four times a day (QID) | INTRAMUSCULAR | Status: DC
  Administered 2015-05-25 – 2015-05-30 (×19): 60 mg via INTRAVENOUS
  Filled 2015-05-25 (×18): qty 2

## 2015-05-25 MED ORDER — MORPHINE SULFATE (PF) 4 MG/ML IV SOLN
4.0000 mg | Freq: Once | INTRAVENOUS | Status: AC
  Administered 2015-05-25: 4 mg via INTRAVENOUS
  Filled 2015-05-25: qty 1

## 2015-05-25 MED ORDER — IPRATROPIUM-ALBUTEROL 0.5-2.5 (3) MG/3ML IN SOLN
3.0000 mL | RESPIRATORY_TRACT | Status: DC
  Administered 2015-05-25 – 2015-05-30 (×28): 3 mL via RESPIRATORY_TRACT
  Filled 2015-05-25 (×26): qty 3

## 2015-05-25 MED ORDER — POTASSIUM GLUCONATE 595 MG PO CAPS: ORAL_CAPSULE | Freq: Every day | ORAL | Status: DC

## 2015-05-25 MED ORDER — CLOPIDOGREL BISULFATE 75 MG PO TABS
75.0000 mg | ORAL_TABLET | Freq: Every day | ORAL | Status: DC
  Administered 2015-05-25 – 2015-05-30 (×4): 75 mg via ORAL
  Filled 2015-05-25 (×4): qty 1

## 2015-05-25 MED ORDER — LEVOTHYROXINE SODIUM 100 MCG PO TABS
100.0000 ug | ORAL_TABLET | Freq: Every day | ORAL | Status: DC
  Filled 2015-05-25: qty 1

## 2015-05-25 NOTE — Progress Notes (Signed)
Patient is on 2L [place] sat 96%. Patient is in no distress. ABG results have improved. BIPAP is not needed at this time. RT will continue to monitor.

## 2015-05-25 NOTE — Progress Notes (Signed)
Pt taken off bipap and placed on 4L [place]. Will follow up with an ABG in an hour. RT will continue to monitor.

## 2015-05-25 NOTE — H&P (Signed)
[facility] History and Physical  [name] ZOX:[id number] DOB: 24-Sep-1923 DOA: 05/22/2015  Referring physician: Dr Patria Mane - [facility] PCP: Ginette Otto, MD   Chief Complaint: SOB  HPI: [name] is a 82 y.o. female  Level V caveat: History provided by EDP and patient's daughter who is a close caretaker. Patient currently unable to give any significant contributions to history due to being on BiPAP and acute respiratory failure.  Patient presenting with approximately 24 hours of acute worsening shortness of breath and cough. Patient is on baseline 1.5 L nasal cannula with intermittent dry cough. Husband recently in the home with pneumonia. Patient developed a wet cough over the last 24 hours. Home O2 was increased to 2 L by nasal cannula with respiratory rate never going above 20 and O2 sats never below 94%. Denies any fevers though patient did complain of having a damp head after possible subjective fevers. Cough is now wet. Home O2 and breathing treatments with minimal benefit.  Of note patient fell on 05/20/2015 and struck her left posterior rib cage. This is caused her to breathe more shallowly since that time. Occasional Norco with some improvement in the pain.  No bowel movement in the last 4 days. This typically happens to her after starting narcotics. At baseline the patient takes for Senokot tablets daily for constipation.  Patient's daughter started her on doxycycline 100 mg twice daily last night. This was started from a previous prescription that the patient had a home.  Patient's daughter also complaining of foul-smelling urine from the patient. Denies dysuria, frequency, flank pain. Has not had a UTI in many many years.   Review of Systems:   Unable to obtain further review of systems due to patient's current status   Past Medical History  Diagnosis Date  . Osteoporosis     9 Compression Fx with slt cord deformity   . Other abnormal glucose   .  Hyperlipidemia   . Esophageal reflux   . COPD (chronic obstructive pulmonary disease) (HCC)     On home O2 and daily Prednisone since march 2009. PFTs 09/05/07 FEV1 49% with improvement by 8% ratio 39% and diffusing capacity 58%    . Hypothyroidism   . Tobacco abuse, in remission quit 2002  . Atelectasis of right lung     chronic rt lower lung seen on cT 2010  . Chronic respiratory failure [facility])    Past Surgical History  Procedure Laterality Date  . Cholecystectomy    . Ankle fracture surgery      left 10/12/2008  . Colon surgery      stents to arteries in abdomen   Social History:  reports that she quit smoking about 15 years ago. Her smoking use included Cigarettes. She has a 75 pack-year smoking history. She does not have any smokeless tobacco history on file. She reports that she does not drink alcohol or use illicit drugs.  Allergies  Allergen Reactions  . Cefdinir Hives and Shortness Of Breath  . Levofloxacin     Unable to sleep, pt stated that she felt crazy--only took for 3 days  . Statins Other (See Comments)    unknown  . Sulfonamide Derivatives Hives  . Penicillins Rash         Family History  Problem Relation Age of Onset  . Emphysema Brother     multiple  . Emphysema Father   . Diabetes Mother   . Diabetes Sister       X65  .  Diabetes Brother       X2  . Congestive Heart Failure Sister   . Cancer Father     GI      Prior to Admission medications   Medication Sig Start Date End Date Taking? Authorizing Provider  albuterol (PROVENTIL HFA) 108 (90 BASE) MCG/ACT inhaler Inhale 2 puffs into the lungs every 4 (four) hours as needed for wheezing. For shortness of breath 06/16/12   Nyoka Cowden, MD  albuterol (PROVENTIL) (2.5 MG/3ML) 0.083% nebulizer solution USE 1 VIAL IN NEBULIZER EVERY 4 HOURS AS NEEDED FOR WHEEZING 05/13/13   Nyoka Cowden, MD  antiseptic oral rinse (BIOTENE) LIQD 15 mLs by Mouth Rinse route as needed for dry mouth.    Historical Provider, MD   aspirin EC 81 MG tablet Take 81 mg by mouth daily.    Historical Provider, MD  BENICAR 20 MG tablet TAKE 1/2 TABLET BY MOUTH ONCE DAILY. 11/01/14   Nyoka Cowden, MD  budesonide (PULMICORT) 0.25 MG/2ML nebulizer solution Take 2 mLs (0.25 mg total) by nebulization 2 (two) times daily. 10/15/13   Nyoka Cowden, MD  clopidogrel (PLAVIX) 75 MG tablet Take 75 mg by mouth daily.      Historical Provider, MD  diazepam (VALIUM) 5 MG tablet Take 2.5 mg by mouth 2 (two) times daily as needed.  10/12/10   Tammy S Parrett, NP  ferrous sulfate 325 (65 FE) MG EC tablet Take 325 mg by mouth daily.  10/12/10   Tammy S Parrett, NP  furosemide (LASIX) 40 MG tablet Take 60 mg by mouth daily. Take 1 and 1/2 tabs by mouth once daily 10/12/10   Tammy S Parrett, NP  guaiFENesin (MUCINEX) 600 MG 12 hr tablet 1-2 every 12 hour as needed for cough and congestion 09/04/11   Nyoka Cowden, MD  insulin aspart (NOVOLOG) 100 UNIT/ML injection Inject 0-10 Units into the skin daily before lunch. Per sliding scale.    Historical Provider, MD  insulin lispro protamine-insulin lispro (HUMALOG 75/25) (75-25) 100 UNIT/ML SUSP Inject 10-38 Units into the skin See admin instructions. 38 units with breakfast, 10 units with dinner    Tammy S Parrett, NP  levothyroxine (SYNTHROID, LEVOTHROID) 88 MCG tablet Take 88 mcg by mouth daily.      Historical Provider, MD  Magnesium 250 MG TABS Take 1 tablet by mouth daily.      Historical Provider, MD  omeprazole (PRILOSEC) 20 MG capsule Take 1 capsule by mouth daily.  10/18/10   Historical Provider, MD  Potassium Gluconate 595 MG CAPS 1 tablet by mouth once daily    Historical Provider, MD  predniSONE (DELTASONE) 50 MG tablet Take 1 tablet (50 mg total) by mouth daily. Take 40 mg daily for 3 days, then 30 mg daily for 3 days, then 20 mg daily for 3 days then resume 10 mg daily (home dose). 01/24/15   Dione Booze, MD  senna (SENOKOT) 8.6 MG tablet Take 2 tablets by mouth 2 (two) times daily.     Historical  Provider, MD  sertraline (ZOLOFT) 50 MG tablet Take 75 mg by mouth daily.     Historical Provider, MD  sodium chloride (OCEAN) 0.65 % nasal spray Place 2 sprays into the nose 2 (two) times daily as needed for congestion. 10/12/10   Julio Sicks, NP   Physical Exam: Filed Vitals:   06/07/2015 1323 06-07-2015 1345 06/07/15 1400 2015/06/07 1415  BP: 109/54 106/48 119/47 114/49  Pulse: 74 74 76 75  Temp:      TempSrc:      Resp: 14 15 16 11   SpO2: 100% 100% 100% 100%    Wt Readings from Last 3 Encounters:  01/24/15 65.772 kg (145 lb)  07/18/12 70.7 kg (155 lb 13.8 oz)  05/15/12 65.862 kg (145 lb 3.2 oz)    General: Distressed Eyes:  PERRL, EOMI, normal lids, iris ENT: dry mm Neck:  no LAD, masses or thyromegaly Cardiovascular:  RRR, II/VI systolic murmur. Trace LE edema.  Respiratory: ON BIPAP. Diffuse wheezing and rhonchi with diminished breath sounds at bases Abdomen:  soft, ntnd Skin:  no rash or induration seen on limited exam Musculoskeletal:  grossly normal tone BUE/BLE Psychiatric:  grossly normal mood and affect, speech fluent and appropriate Neurologic:  CN 2-12 grossly intact, moves all extremities in coordinated fashion.          Labs on Admission:  Basic Metabolic Panel:  Recent Labs Lab 06/10/2015 1313  NA 136  K 4.8  CL 100*  CO2 26  GLUCOSE 269*  BUN 54*  CREATININE 1.22*  CALCIUM 8.2*   Liver Function Tests:  Recent Labs Lab 05/21/2015 1313  AST 24  ALT 17  ALKPHOS 50  BILITOT 0.6  PROT 6.0*  ALBUMIN 2.9*   No results for input(s): LIPASE, AMYLASE in the last 168 hours. No results for input(s): AMMONIA in the last 168 hours. CBC:  Recent Labs Lab 05/26/2015 1313  WBC 10.1  HGB 9.8*  HCT 31.9*  MCV 94.7  PLT 249   Cardiac Enzymes: No results for input(s): CKTOTAL, CKMB, CKMBINDEX, TROPONINI in the last 168 hours.  BNP (last 3 results)  Recent Labs  06/08/2015 1313  BNP 557.4*    ProBNP (last 3 results) No results for input(s):  PROBNP in the last 8760 hours.   Creatinine clearance cannot be calculated (Unknown ideal weight.)  CBG: No results for input(s): GLUCAP in the last 168 hours.  Radiological Exams on Admission: Dg Chest Portable 1 View  05/16/2015  CLINICAL DATA:  Shortness of breath, COPD, diabetes, hypertension, former smoker EXAM: PORTABLE CHEST 1 VIEW COMPARISON:  Portable exam at 1301 hr compared to 01/24/2015 FINDINGS: Enlargement of cardiac silhouette with pulmonary vascular congestion. Mediastinal contours normal for degree of rotation to the RIGHT. Atherosclerotic calcification aorta. Lungs grossly clear. Question LEFT nipple shadow. No pleural effusion or pneumothorax. Osseous demineralization with BILATERAL glenohumeral degenerative changes. IMPRESSION: Enlargement of cardiac silhouette with slight vascular congestion. Question LEFT nipple shadow ; repeat chest radiograph with nipple markers recommended to exclude pulmonary nodule. Electronically Signed   By: Ulyses SouthwardMark  Boles M.D.   On: 05/15/2015 13:31     Assessment/Plan Principal Problem:   Acute-on-chronic respiratory failure (HCC) Active Problems:   COPD exacerbation (HCC)   Hypertension   Elevated troponin   Rib contusion   Constipation   Hypothyroid  Acute on chronic respiratory failure: Requiring BiPAP. ABG showing pH 7.3, PCO2 60.2, PO2 256, bicarbonate 29.7. Patient somewhat somnolent but arousable and oriented. Lactic acid 2.02. BNP 557. Apparent COPD exacerbation with mild pulmonary congestion suggestive of very mild CHF. Last echo showing preserved EF - Stepdown - Repeat ABG at 17:30 - DuoNeb every 4 - Solu-Medrol 60 mg every 6 (Home daily prednisone 10mg ) - Mucinex - Doxycycline - IV Lasix 60 mg 1 then resume home Lasix - Magnesium - Repeat echo  Elevated Troponin: 0.21 on admission. 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 Suspect demand - cycle trop - EKG in am -  Increase to full dose ASA - Continue  plavix  Rib contusion: L rib contusion from fall on 05/20/15. No fracture on plain film. - Incentive spirometer.  - lidoderm patch - Voltaren gel  Foul Urine:  - f/u UA  Constipation:opioid exacerbation of chronic constipation - Increase Senokot. (6 pills daily at home) - Miralax - Consider enema  Hypothyroid: - continue synthroid  HTN: - continue Benicar   Code Status: DNR  DVT Prophylaxis: Hep Family Communication: Daughter Disposition Plan: Pending Improvement    Jamisen Duerson J, MD Family Medicine [facility] [email or web address] Password [email or web address]

## 2015-05-25 NOTE — Progress Notes (Signed)
Pt admitted earlier by our doc, Dr. Konrad DoloresMerrell of [facility]. She has been holding in ED. Was admitted to go to SDU but was on bipap at time of admit. Now, she is on 2L O2 per [place] and satting normally without any increased WOB. Therefore, will let her go to a tele bed and have a diet. Discussed plan with [place], RN in ED.  KJKG, NP [facility]

## 2015-05-25 NOTE — ED Notes (Signed)
Family at bedside. 

## 2015-05-25 NOTE — ED Notes (Signed)
Pt reports to the ED for eval of increased SOB that began this am. Pts family gave her home neb treatment without relief so they called EMS. En route pt was noted to be tachypnic and have rales, rhonchi, and wheezing. She was a total of 10 mg of albuterol, 1 mg of atrovent, 2 g of mag, 125 mg of solumedrol, and IM epi. She was also placed on BiPAP en route. Per EMS pt also had a fall on Sunday and has subsequent left sided rib cage pain. CBG 240 mg/dl en route. Pt A&O at baseline. Pt tolerating BiPAP well at this time.

## 2015-05-25 NOTE — ED Notes (Signed)
DINNER TRAY ORDERED  

## 2015-05-25 NOTE — Progress Notes (Signed)
RT attempt ABG x2 wasn't able to obtain. Will let night RT attempt.

## 2015-05-25 NOTE — ED Provider Notes (Signed)
CSN: [id number]     Arrival date & time 06/23/15  1248 History   First MD Initiated Contact with Patient 06-23-15 1252     Chief Complaint  Patient presents with  . Shortness of Breath     HPI Patient presents to the emergency department with increasing shortness of breath that began this morning.  Patient has a history of oxygen dependent COPD.  She was given bronchodilators at home without improvement in her symptoms.  EMS gave 2 mg of albuterol 1 mg of Atrovent, 2 g of magnesium, 125 mg of Solu-Medrol, I am happy for severe shortness of breath on arrival.  They also begin the patient on C Pap for decreased air movement.  She began having improved air movement on arrival to the emergency department is feeling better this time.  She is still requiring BiPAP.  Patient is a productive cough at home.   Past Medical History  Diagnosis Date  . Osteoporosis     9 Compression Fx with slt cord deformity   . Other abnormal glucose   . Hyperlipidemia   . Esophageal reflux   . COPD (chronic obstructive pulmonary disease) (HCC)     On home O2 and daily Prednisone since march 2009. PFTs 09/05/07 FEV1 49% with improvement by 8% ratio 39% and diffusing capacity 58%    . Hypothyroidism   . Tobacco abuse, in remission quit 2002  . Atelectasis of right lung     chronic rt lower lung seen on cT 2010  . Chronic respiratory failure [facility])    Past Surgical History  Procedure Laterality Date  . Cholecystectomy    . Ankle fracture surgery      left 10/12/2008  . Colon surgery      stents to arteries in abdomen   Family History  Problem Relation Age of Onset  . Emphysema Brother     multiple  . Emphysema Father   . Diabetes Mother   . Diabetes Sister       X93  . Diabetes Brother       X2  . Congestive Heart Failure Sister   . Cancer Father     GI    Social History  Substance Use Topics  . Smoking status: Former Smoker -- 1.50 packs/day for 50 years    Types: Cigarettes    Quit date:  05/14/2000  . Smokeless tobacco: None  . Alcohol Use: No   OB History    No data available     Review of Systems  All other systems reviewed and are negative.     Allergies  Cefdinir; Levofloxacin; Statins; Sulfonamide derivatives; and Penicillins  Home Medications   Prior to Admission medications   Medication Sig Start Date End Date Taking? Authorizing Provider  albuterol (PROVENTIL) (2.5 MG/3ML) 0.083% nebulizer solution USE 1 VIAL IN NEBULIZER EVERY 4 HOURS AS NEEDED FOR WHEEZING 05/13/13  Yes Nyoka Cowden, MD  antiseptic oral rinse (BIOTENE) LIQD 15 mLs by Mouth Rinse route as needed for dry mouth.   Yes Historical Provider, MD  aspirin EC 81 MG tablet Take 81 mg by mouth daily.   Yes Historical Provider, MD  BENICAR 20 MG tablet TAKE 1/2 TABLET BY MOUTH ONCE DAILY. 11/01/14  Yes Nyoka Cowden, MD  budesonide (PULMICORT) 0.25 MG/2ML nebulizer solution Take 2 mLs (0.25 mg total) by nebulization 2 (two) times daily. 10/15/13  Yes Nyoka Cowden, MD  clopidogrel (PLAVIX) 75 MG tablet Take 75 mg by mouth daily.  Yes Historical Provider, MD  doxycycline (VIBRAMYCIN) 100 MG capsule Take 100 mg by mouth 2 (two) times daily.   Yes Historical Provider, MD  ferrous sulfate 325 (65 FE) MG EC tablet Take 325 mg by mouth daily.  10/12/10  Yes Tammy S Parrett, NP  furosemide (LASIX) 40 MG tablet Take 60 mg by mouth daily. Take 1 and 1/2 tabs by mouth once daily 10/12/10  Yes Tammy S Parrett, NP  guaiFENesin (MUCINEX) 600 MG 12 hr tablet 1-2 every 12 hour as needed for cough and congestion 09/04/11  Yes Nyoka CowdenMichael B Wert, MD  insulin aspart (NOVOLOG) 100 UNIT/ML injection Inject 0-10 Units into the skin daily before lunch. Reported on 05-Nov-2015   Yes Historical Provider, MD  ketoconazole (NIZORAL) 2 % cream Apply 1 application topically daily as needed for irritation.  03/16/15  Yes Historical Provider, MD  levothyroxine (SYNTHROID, LEVOTHROID) 100 MCG tablet 100 mcg. 04/27/15  Yes Historical  Provider, MD  Magnesium 250 MG TABS Take 1 tablet by mouth daily.     Yes Historical Provider, MD  NOVOLOG MIX 70/30 (70-30) 100 UNIT/ML injection Inject 10-38 Units into the skin 2 (two) times daily with a meal. 35-38 units in the morning with breakfast, 10 units with supper 05/21/15  Yes Historical Provider, MD  Potassium Gluconate 595 MG CAPS 1 tablet by mouth once daily   Yes Historical Provider, MD  predniSONE (DELTASONE) 10 MG tablet Take 40 mg by mouth daily. 04/27/15  Yes Historical Provider, MD  senna (SENOKOT) 8.6 MG tablet Take 2 tablets by mouth 2 (two) times daily.    Yes Historical Provider, MD  sertraline (ZOLOFT) 50 MG tablet Take 75 mg by mouth daily.    Yes Historical Provider, MD  sodium chloride (OCEAN) 0.65 % nasal spray Place 2 sprays into the nose 2 (two) times daily as needed for congestion. 10/12/10  Yes Tammy S Parrett, NP  albuterol (PROVENTIL HFA) 108 (90 BASE) MCG/ACT inhaler Inhale 2 puffs into the lungs every 4 (four) hours as needed for wheezing. For shortness of breath Patient not taking: Reported on 05-Nov-2015 06/16/12   Nyoka CowdenMichael B Wert, MD  predniSONE (DELTASONE) 50 MG tablet Take 1 tablet (50 mg total) by mouth daily. Take 40 mg daily for 3 days, then 30 mg daily for 3 days, then 20 mg daily for 3 days then resume 10 mg daily (home dose). Patient not taking: Reported on 05-Nov-2015 01/24/15   Dione Boozeavid Glick, MD   BP 145/73 mmHg  Pulse 79  Temp(Src) 99.9 F (37.7 C) (Rectal)  Resp 24  SpO2 100% Physical Exam  Constitutional: She is oriented to person, place, and time. She appears well-developed and well-nourished. No distress.  HENT:  Head: Normocephalic and atraumatic.  Eyes: EOM are normal.  Neck: Normal range of motion.  Cardiovascular: Normal rate and regular rhythm.   Pulmonary/Chest: She is in respiratory distress. She has wheezes.  Decreased air movement at bases  Abdominal: Soft. She exhibits no distension. There is no tenderness.  Musculoskeletal: Normal  range of motion.  Neurological: She is alert and oriented to person, place, and time.  Skin: Skin is warm and dry.  Psychiatric: She has a normal mood and affect. Judgment normal.  Nursing note and vitals reviewed.   ED Course  Procedures (including critical care time)  CRITICAL CARE Performed by: Lyanne CoAMPOS,Walterine Amodei M Total critical care time: 40 minutes Critical care time was exclusive of separately billable procedures and treating other patients. Critical care was necessary to treat  or prevent imminent or life-threatening deterioration. Critical care was time spent personally by me on the following activities: development of treatment plan with patient and/or surrogate as well as nursing, discussions with consultants, evaluation of patient's response to treatment, examination of patient, obtaining history from patient or surrogate, ordering and performing treatments and interventions, ordering and review of laboratory studies, ordering and review of radiographic studies, pulse oximetry and re-evaluation of patient's condition.   Labs Review Labs Reviewed  CBC - Abnormal; Notable for the following:    RBC 3.37 (*)    Hemoglobin 9.8 (*)    HCT 31.9 (*)    All other components within normal limits  COMPREHENSIVE METABOLIC PANEL - Abnormal; Notable for the following:    Chloride 100 (*)    Glucose, Bld 269 (*)    BUN 54 (*)    Creatinine, Ser 1.22 (*)    Calcium 8.2 (*)    Total Protein 6.0 (*)    Albumin 2.9 (*)    GFR calc non Af Amer 38 (*)    GFR calc Af Amer 44 (*)    All other components within normal limits  BRAIN NATRIURETIC PEPTIDE - Abnormal; Notable for the following:    B Natriuretic Peptide 557.4 (*)    All other components within normal limits  I-STAT TROPOININ, ED - Abnormal; Notable for the following:    Troponin i, poc 0.21 (*)    All other components within normal limits  I-STAT ARTERIAL BLOOD GAS, ED - Abnormal; Notable for the following:    pH, Arterial 7.301  (*)    pCO2 arterial 60.2 (*)    pO2, Arterial 256.0 (*)    Bicarbonate 29.7 (*)    All other components within normal limits  I-STAT CG4 LACTIC ACID, ED - Abnormal; Notable for the following:    Lactic Acid, Venous 2.02 (*)    All other components within normal limits  PROTIME-INR  MAGNESIUM  BLOOD GAS, ARTERIAL  TROPONIN I  TROPONIN I  TROPONIN I    Imaging Review Dg Chest Portable 1 View  06/14/2015  CLINICAL DATA:  Shortness of breath, COPD, diabetes, hypertension, former smoker EXAM: PORTABLE CHEST 1 VIEW COMPARISON:  Portable exam at 1301 hr compared to 01/24/2015 FINDINGS: Enlargement of cardiac silhouette with pulmonary vascular congestion. Mediastinal contours normal for degree of rotation to the RIGHT. Atherosclerotic calcification aorta. Lungs grossly clear. Question LEFT nipple shadow. No pleural effusion or pneumothorax. Osseous demineralization with BILATERAL glenohumeral degenerative changes. IMPRESSION: Enlargement of cardiac silhouette with slight vascular congestion. Question LEFT nipple shadow ; repeat chest radiograph with nipple markers recommended to exclude pulmonary nodule. Electronically Signed   By: Ulyses Southward M.D.   On: 05/20/2015 13:31   I have personally reviewed and evaluated these images and lab results as part of my medical decision-making.   EKG Interpretation   Date/Time:  Wednesday May 25 2015 12:55:15 EST Ventricular Rate:  83 PR Interval:  167 QRS Duration: 155 QT Interval:  438 QTC Calculation: 515 R Axis:   -94 Text Interpretation:  Sinus rhythm RBBB and LAFB Baseline wander in  lead(s) V1 No significant change was found Confirmed by Jennier Schissler  MD, Sanyah Molnar  ([id number]) on 05/23/2015 4:33:49 PM      MDM   Final diagnoses:  COPD exacerbation (HCC)  Acute respiratory failure with hypercapnia (HCC)    Patient started on BiPAP for respiratory failure.  Likely severe COPD with poor respiratory status secondary to rib contusion on the left.  No  clear pneumonia this time.  Admit to stepdown unit.  Hypoxia.   Azalia Bilis, MD 06-03-15 [phone]

## 2015-05-26 ENCOUNTER — Encounter (HOSPITAL_COMMUNITY): Payer: Self-pay | Admitting: General Practice

## 2015-05-26 ENCOUNTER — Inpatient Hospital Stay (HOSPITAL_COMMUNITY): Payer: Medicare Other

## 2015-05-26 ENCOUNTER — Other Ambulatory Visit (HOSPITAL_COMMUNITY): Payer: BLUE CROSS/BLUE SHIELD

## 2015-05-26 DIAGNOSIS — I158 Other secondary hypertension: Secondary | ICD-10-CM

## 2015-05-26 DIAGNOSIS — R739 Hyperglycemia, unspecified: Secondary | ICD-10-CM | POA: Diagnosis present

## 2015-05-26 DIAGNOSIS — R000 Tachycardia, unspecified: Secondary | ICD-10-CM

## 2015-05-26 DIAGNOSIS — R400 Somnolence: Secondary | ICD-10-CM

## 2015-05-26 DIAGNOSIS — J9602 Acute respiratory failure with hypercapnia: Secondary | ICD-10-CM

## 2015-05-26 DIAGNOSIS — N189 Chronic kidney disease, unspecified: Secondary | ICD-10-CM

## 2015-05-26 DIAGNOSIS — E038 Other specified hypothyroidism: Secondary | ICD-10-CM

## 2015-05-26 DIAGNOSIS — N179 Acute kidney failure, unspecified: Secondary | ICD-10-CM

## 2015-05-26 LAB — GLUCOSE, CAPILLARY
GLUCOSE-CAPILLARY: 543 mg/dL — AB (ref 65–99)
GLUCOSE-CAPILLARY: 411 mg/dL — AB (ref 65–99)
GLUCOSE-CAPILLARY: 354 mg/dL — AB (ref 65–99)
GLUCOSE-CAPILLARY: 136 mg/dL — AB (ref 65–99)
GLUCOSE-CAPILLARY: 230 mg/dL — AB (ref 65–99)
GLUCOSE-CAPILLARY: 197 mg/dL — AB (ref 65–99)
Glucose-Capillary: 417 mg/dL — ABNORMAL HIGH (ref 65–99)
Glucose-Capillary: 338 mg/dL — ABNORMAL HIGH (ref 65–99)
Glucose-Capillary: 155 mg/dL — ABNORMAL HIGH (ref 65–99)
Glucose-Capillary: 154 mg/dL — ABNORMAL HIGH (ref 65–99)
Glucose-Capillary: 132 mg/dL — ABNORMAL HIGH (ref 65–99)
Glucose-Capillary: 105 mg/dL — ABNORMAL HIGH (ref 65–99)
Glucose-Capillary: 156 mg/dL — ABNORMAL HIGH (ref 65–99)

## 2015-05-26 LAB — COMPREHENSIVE METABOLIC PANEL
ALT: 18 U/L (ref 14–54)
ANION GAP: 12 (ref 5–15)
AST: 26 U/L (ref 15–41)
Albumin: 3 g/dL — ABNORMAL LOW (ref 3.5–5.0)
Alkaline Phosphatase: 52 U/L (ref 38–126)
BILIRUBIN TOTAL: 0.5 mg/dL (ref 0.3–1.2)
BUN: 61 mg/dL — ABNORMAL HIGH (ref 6–20)
CHLORIDE: 96 mmol/L — AB (ref 101–111)
CO2: 27 mmol/L (ref 22–32)
Calcium: 7.9 mg/dL — ABNORMAL LOW (ref 8.9–10.3)
Creatinine, Ser: 1.58 mg/dL — ABNORMAL HIGH (ref 0.44–1.00)
GFR, EST AFRICAN AMERICAN: 32 mL/min — AB (ref >60)
GFR, EST NON AFRICAN AMERICAN: 28 mL/min — AB (ref >60)
Glucose, Bld: 641 mg/dL (ref 65–99)
POTASSIUM: 5.2 mmol/L — AB (ref 3.5–5.1)
Sodium: 135 mmol/L (ref 135–145)
TOTAL PROTEIN: 6.1 g/dL — AB (ref 6.5–8.1)

## 2015-05-26 LAB — MRSA PCR SCREENING: MRSA BY PCR: NEGATIVE

## 2015-05-26 LAB — LIPID PANEL
CHOLESTEROL: 241 mg/dL — AB (ref 0–200)
HDL: 59 mg/dL (ref >40)
LDL Cholesterol: 151 mg/dL — ABNORMAL HIGH (ref 0–99)
Total CHOL/HDL Ratio: 4.1 RATIO
Triglycerides: 154 mg/dL — ABNORMAL HIGH (ref <150)
VLDL: 31 mg/dL (ref 0–40)

## 2015-05-26 LAB — INFLUENZA PANEL BY PCR (TYPE A & B)
H1N1 flu by pcr: NOT DETECTED
INFLAPCR: NEGATIVE
Influenza B By PCR: NEGATIVE

## 2015-05-26 LAB — TROPONIN I: Troponin I: 0.22 ng/mL — ABNORMAL HIGH (ref <0.031)

## 2015-05-26 LAB — LACTIC ACID, PLASMA
LACTIC ACID, VENOUS: 3.2 mmol/L — AB (ref 0.5–2.0)
Lactic Acid, Venous: 3.6 mmol/L (ref 0.5–2.0)

## 2015-05-26 LAB — CBC
HEMATOCRIT: 31.2 % — AB (ref 36.0–46.0)
Hemoglobin: 9.8 g/dL — ABNORMAL LOW (ref 12.0–15.0)
MCH: 29.2 pg (ref 26.0–34.0)
MCHC: 31.4 g/dL (ref 30.0–36.0)
MCV: 92.9 fL (ref 78.0–100.0)
PLATELETS: 269 10*3/uL (ref 150–400)
RBC: 3.36 MIL/uL — ABNORMAL LOW (ref 3.87–5.11)
RDW: 14.5 % (ref 11.5–15.5)
WBC: 5.8 10*3/uL (ref 4.0–10.5)

## 2015-05-26 LAB — BASIC METABOLIC PANEL
Anion gap: 12 (ref 5–15)
Anion gap: 15 (ref 5–15)
BUN: 60 mg/dL — AB (ref 6–20)
BUN: 62 mg/dL — AB (ref 6–20)
CALCIUM: 8.3 mg/dL — AB (ref 8.9–10.3)
CALCIUM: 8.6 mg/dL — AB (ref 8.9–10.3)
CHLORIDE: 101 mmol/L (ref 101–111)
CO2: 26 mmol/L (ref 22–32)
CO2: 26 mmol/L (ref 22–32)
CREATININE: 1.45 mg/dL — AB (ref 0.44–1.00)
CREATININE: 1.43 mg/dL — AB (ref 0.44–1.00)
Chloride: 101 mmol/L (ref 101–111)
GFR calc non Af Amer: 31 mL/min — ABNORMAL LOW (ref >60)
GFR, EST AFRICAN AMERICAN: 36 mL/min — AB (ref >60)
GFR, EST AFRICAN AMERICAN: 36 mL/min — AB (ref >60)
GFR, EST NON AFRICAN AMERICAN: 31 mL/min — AB (ref >60)
Glucose, Bld: 424 mg/dL — ABNORMAL HIGH (ref 65–99)
Glucose, Bld: 207 mg/dL — ABNORMAL HIGH (ref 65–99)
Potassium: 4.6 mmol/L (ref 3.5–5.1)
Potassium: 4.1 mmol/L (ref 3.5–5.1)
SODIUM: 139 mmol/L (ref 135–145)
SODIUM: 142 mmol/L (ref 135–145)

## 2015-05-26 LAB — STREP PNEUMONIAE URINARY ANTIGEN: STREP PNEUMO URINARY ANTIGEN: NEGATIVE

## 2015-05-26 MED ORDER — LORAZEPAM 2 MG/ML IJ SOLN
2.0000 mg | INTRAMUSCULAR | Status: DC | PRN
  Administered 2015-05-27 – 2015-05-29 (×3): 2 mg via INTRAVENOUS
  Filled 2015-05-26 (×3): qty 1

## 2015-05-26 MED ORDER — LEVOTHYROXINE SODIUM 100 MCG IV SOLR
50.0000 ug | INTRAVENOUS | Status: DC
  Administered 2015-05-26 – 2015-05-30 (×5): 50 ug via INTRAVENOUS
  Filled 2015-05-26 (×5): qty 5

## 2015-05-26 MED ORDER — INSULIN ASPART 100 UNIT/ML ~~LOC~~ SOLN
0.0000 [IU] | SUBCUTANEOUS | Status: DC
  Administered 2015-05-26 (×2): 2 [IU] via SUBCUTANEOUS
  Administered 2015-05-27: 5 [IU] via SUBCUTANEOUS
  Administered 2015-05-27: 3 [IU] via SUBCUTANEOUS
  Administered 2015-05-27: 5 [IU] via SUBCUTANEOUS
  Administered 2015-05-27: 3 [IU] via SUBCUTANEOUS
  Administered 2015-05-27: 5 [IU] via SUBCUTANEOUS
  Administered 2015-05-28: 9 [IU] via SUBCUTANEOUS
  Administered 2015-05-28: 7 [IU] via SUBCUTANEOUS
  Administered 2015-05-28: 5 [IU] via SUBCUTANEOUS

## 2015-05-26 MED ORDER — LEVOFLOXACIN IN D5W 750 MG/150ML IV SOLN
750.0000 mg | Freq: Once | INTRAVENOUS | Status: AC
  Administered 2015-05-26: 750 mg via INTRAVENOUS
  Filled 2015-05-26: qty 150

## 2015-05-26 MED ORDER — HYDRALAZINE HCL 20 MG/ML IJ SOLN
5.0000 mg | INTRAMUSCULAR | Status: DC | PRN
  Administered 2015-05-28 – 2015-05-29 (×3): 5 mg via INTRAVENOUS
  Filled 2015-05-26 (×3): qty 1

## 2015-05-26 MED ORDER — LEVOFLOXACIN 500 MG PO TABS
500.0000 mg | ORAL_TABLET | ORAL | Status: AC
  Administered 2015-05-28 – 2015-05-30 (×2): 500 mg via ORAL
  Filled 2015-05-26 (×2): qty 1

## 2015-05-26 MED ORDER — SODIUM CHLORIDE 0.9 % IV SOLN: INTRAVENOUS | Status: DC

## 2015-05-26 MED ORDER — INSULIN REGULAR HUMAN 100 UNIT/ML IJ SOLN
INTRAMUSCULAR | Status: DC
  Administered 2015-05-26: 1.7 [IU]/h via INTRAVENOUS
  Administered 2015-05-26: 8.8 [IU]/h via INTRAVENOUS
  Administered 2015-05-26: 0.7 [IU]/h via INTRAVENOUS
  Administered 2015-05-26: 7.1 [IU]/h via INTRAVENOUS
  Administered 2015-05-26: 11.1 [IU]/h via INTRAVENOUS
  Administered 2015-05-26: 2.7 [IU]/h via INTRAVENOUS
  Administered 2015-05-26: 4.8 [IU]/h via INTRAVENOUS
  Administered 2015-05-26: 1.9 [IU]/h via INTRAVENOUS
  Filled 2015-05-26: qty 2.5

## 2015-05-26 MED ORDER — INSULIN ASPART 100 UNIT/ML ~~LOC~~ SOLN: 0.0000 [IU] | SUBCUTANEOUS | Status: DC

## 2015-05-26 MED ORDER — LORAZEPAM 2 MG/ML IJ SOLN
2.0000 mg | Freq: Once | INTRAMUSCULAR | Status: AC
  Administered 2015-05-26: 2 mg via INTRAVENOUS
  Filled 2015-05-26: qty 1

## 2015-05-26 MED ORDER — METOPROLOL TARTRATE 1 MG/ML IV SOLN
5.0000 mg | Freq: Once | INTRAVENOUS | Status: AC
  Administered 2015-05-26: 5 mg via INTRAVENOUS
  Filled 2015-05-26: qty 5

## 2015-05-26 MED ORDER — DEXTROSE-NACL 5-0.9 % IV SOLN
INTRAVENOUS | Status: DC
  Administered 2015-05-26: 22:00:00 via INTRAVENOUS

## 2015-05-26 MED ORDER — CETYLPYRIDINIUM CHLORIDE 0.05 % MT LIQD
7.0000 mL | Freq: Two times a day (BID) | OROMUCOSAL | Status: DC
  Administered 2015-05-26 – 2015-05-30 (×9): 7 mL via OROMUCOSAL

## 2015-05-26 MED ORDER — DEXTROSE 5 % IV SOLN
500.0000 mg | INTRAVENOUS | Status: DC
  Filled 2015-05-26: qty 500

## 2015-05-26 MED ORDER — DEXTROSE-NACL 5-0.45 % IV SOLN: INTRAVENOUS | Status: DC

## 2015-05-26 NOTE — Progress Notes (Signed)
06/04/2015 Secretary called patient heart rate was in the 200's, it was showing she was running v-tach. She was not responding when name was called. Patient eventually began moving. Heart rate began going in the 130's to 140's. Then eventually she began brady down 40's to 50's. Patient heart rate went up to 120's. Dr Lucretia RoersWood was made aware and patient was assess by MD. Order was given for patient to receive one time dose Lopressor 5mg  iv. [facility] LLC[facility]adine Kenia Teagarden RN.

## 2015-05-26 NOTE — Progress Notes (Signed)
PT Cancellation Note  Patient Details Name: [name] MRN: [id number] DOB: [birth year]   Cancelled Treatment:    Reason Eval/Treat Not Completed: Medical issues which prohibited therapy (Placed on BIpap due to respiratory issues. Nsg asked to HOLD)   Berline LopesWhite, [employer] F 05/26/2015, 11:14 AM  Eber Jonesawn Danasha Melman,PT Acute Rehabilitation [phone] (pager)

## 2015-05-26 NOTE — Progress Notes (Signed)
[facility] TEAM 1 - Stepdown/ICU TEAM Progress Note  [name] JYN:[id number] DOB: [birth year] DOA: 05/24/2015 PCP: Ginette Otto, MD  Admit HPI / Brief Narrative: 82 y.o. female Level V caveat: History provided by EDP and patient's daughter who is a close caretaker. Patient currently unable to give any significant contributions to history due to being on BiPAP and acute respiratory failure. PMHx Chronic Respiratory Failure, COPD on home O2 1.5 L via [place],Diastolic CHF HLD, Hypothyroidism, Chronic Pain Syndrome  Patient presenting with approximately 24 hours of acute worsening shortness of breath and cough. Patient is on baseline 1.5 L nasal cannula with intermittent dry cough. Husband recently in the home with pneumonia. Patient developed a wet cough over the last 24 hours. Home O2 was increased to 2 L by nasal cannula with respiratory rate never going above 20 and O2 sats never below 94%. Denies any fevers though patient did complain of having a damp head after possible subjective fevers. Cough is now wet. Home O2 and breathing treatments with minimal benefit.  Of note patient fell on 05/20/2015 and struck her left posterior rib cage. This is caused her to breathe more shallowly since that time. Occasional Norco with some improvement in the pain.  No bowel movement in the last 4 days. This typically happens to her after starting narcotics. At baseline the patient takes for Senokot tablets daily for constipation.  Patient's daughter started her on doxycycline 100 mg twice daily last night. This was started from a previous prescription that the patient had a home.  Patient's daughter also complaining of foul-smelling urine from the patient. Denies dysuria, frequency, flank pain. Has not had a UTI in many many years.   HPI/Subjective: 1/12 lethargic, will arouse to painful stimuli ,A/O 0, babbles incoherently  Assessment/Plan: Acute on chronic respiratory failure/COPD  Exacerbation/CAP - Requiring BiPAP. ABG showing pH 7.3, PCO2 60.2, PO2 256, bicarbonate 29.7. Patient somewhat somnolent but arousable and oriented. Lactic acid 2.02. BNP 557. Apparent COPD exacerbation with mild pulmonary congestion suggestive of very mild CHF. Last echo showing preserved EF with severe hypokinesis of apex - Repeat ABG at 17:30 - DuoNeb every 4 - Solu-Medrol 60 mg every 6 (Home daily prednisone 10mg ) - Mucinex DM - DC Doxycycline, start Levofloxacin  -D5-0.9% saline 75 ml/hr -Monitor closely for fluid overload  Lactic acidosis - Elevated on admission resolving--> 3.2 -See acute on chronic respiratory failure  Chronic diastolic CHF -Echocardiogram; preserved EF diastolic dysfunction could not be ruled out -Strict in and out -Daily a.m. weight  Elevated Troponin/Demand ischemia:  -0.21 on admission. 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 Suspect demand - cycle trop - Increase to full dose ASA - Continue plavix  Rib contusion:  -L rib contusion from fall on 05/20/15. No fracture on plain film. - Incentive spirometer.  - lidoderm patch - Voltaren gel  UTI? -Urine culture pending   -Most common UTIs covered by current ABx regimen  Acute on chronic renal failure -Improving with hydration, continue monitor  Constipation: -opioid exacerbation of chronic constipation - Increase Senokot. (6 pills daily at home) - Miralax - Consider enema -KUB' moderate stool, will hold on treating until patient more stable  Hypothyroid: - continue synthroid IV 50 g daily  Hyperglycemia -No reported history diabetes obtain A1c, lipid panel -Glucose stabilizer protocol  HTN: -Irbesartan 75 mg when mentation improves/off BiPAP -PRN Hydralazine IV  SBP> 160 or DBP> 100  Sinus tachycardia -Most likely secondary to infection and dehydration  Altered mental status  -  Appears to be multifactorial to include UTI, uremia, and COPD exacerbation    Code Status:  DO NOT RESUSCITATE Family Communication: Daughter present at time of exam Disposition Plan: SNF?    Consultants: NA  Procedure/Significant Events: 1/12 echocardiogram;- LVEF= 55%- 60%. Although no diagnostic regional wall motion abnormality was identified.   1/12 KUB;Moderate stool throughout colon. No frank obstruction or free air seen   Culture 1/12 urine pending 1/12 influenza pending 1/12 respiratory virus panel pending 1/12 strep pneumo urine antigen/Legionella urine antigen pending   1/12 sputum pending   Antibiotics: Doxycycline 1/11>> 1/12 Levofloxacin 1/12  DVT prophylaxis: NA   Devices NA   LINES / TUBES:  NA    Continuous Infusions: . dextrose 5 % and 0.9% NaCl      Objective: VITAL SIGNS: Temp: 98.5 F (36.9 C) (01/12 1600) Temp Source: Axillary (01/12 1600) BP: 131/84 mmHg (01/12 1800) Pulse Rate: 91 (01/12 1925) SPO2; FIO2:   Intake/Output Summary (Last 24 hours) at 05/26/15 1957 Last data filed at 05/26/15 0645  Gross per 24 hour  Intake    250 ml  Output      0 ml  Net    250 ml     Exam: General: lethargic, will arouse to painful stimuli ,A/O 0, babbles incoherently, Acute on chronic Respiratory distress Eyes:negative scleral hemorrhage ENT: Negative Runny nose, negative gingival bleeding, Neck:  Negative scars, masses, torticollis, lymphadenopathy, JVD Lungs: diffuse rhonchi, positive mild expiratory wheezing, negative crackles.  Cardiovascular: Tachycardic, Regular rhythm without murmur gallop or rub normal S1 and S2 Abdomen:  Pain to palpation, positive distended, positive soft, bowel sounds, positive tympany, no rebound, no ascites, no appreciable mass Extremities: No significant cyanosis, clubbing, or edema bilateral lower extremities Psychiatric:  Unable to assess secondary to altered mental status  Neurologic:  Patient responds to painful stimuli, does not follow commands, babbles incoherently.aphasia.   Data  Reviewed: Basic Metabolic Panel:  Recent Labs Lab 2015/06/10 1313 June 10, 2015 1742 05/26/15 0300 05/26/15 0748 05/26/15 1130  NA 136  --  135 139 142  K 4.8  --  5.2* 4.6 4.1  CL 100*  --  96* 101 101  CO2 26  --  27 26 26   GLUCOSE 269*  --  641* 424* 207*  BUN 54*  --  61* 60* 62*  CREATININE 1.22*  --  1.58* 1.45* 1.43*  CALCIUM 8.2*  --  7.9* 8.3* 8.6*  MG  --  2.9*  --   --   --    Liver Function Tests:  Recent Labs Lab 06-10-2015 1313 05/26/15 0300  AST 24 26  ALT 17 18  ALKPHOS 50 52  BILITOT 0.6 0.5  PROT 6.0* 6.1*  ALBUMIN 2.9* 3.0*   No results for input(s): LIPASE, AMYLASE in the last 168 hours. No results for input(s): AMMONIA in the last 168 hours. CBC:  Recent Labs Lab 06/10/15 1313 05/26/15 0300  WBC 10.1 5.8  HGB 9.8* 9.8*  HCT 31.9* 31.2*  MCV 94.7 92.9  PLT 249 269   Cardiac Enzymes:  Recent Labs Lab 06/10/15 1742 06/10/15 2059 05/26/15 0300  TROPONINI 0.29* 0.34* 0.22*   BNP (last 3 results)  Recent Labs  Jun 10, 2015 1313  BNP 557.4*    ProBNP (last 3 results) No results for input(s): PROBNP in the last 8760 hours.  CBG:  Recent Labs Lab 05/26/15 1413 05/26/15 1525 05/26/15 1635 05/26/15 1800 05/26/15 1913  GLUCAP 230* 197* 154* 132* 105*    Recent Results (from the  past 240 hour(s))  MRSA PCR Screening     Status: None   Collection Time: 05/26/15  1:26 AM  Result Value Ref Range Status   MRSA by PCR NEGATIVE NEGATIVE Final    Comment:        The GeneXpert MRSA Assay (FDA approved for NASAL specimens only), is one component of a comprehensive MRSA colonization surveillance program. It is not intended to diagnose MRSA infection nor to guide or monitor treatment for MRSA infections.      Studies:  Recent x-ray studies have been reviewed in detail by the Attending Physician  Scheduled Meds:  Scheduled Meds: . antiseptic oral rinse  7 mL Mouth Rinse BID  . aspirin EC  325 mg Oral Daily  . clopidogrel  75 mg  Oral Daily  . diclofenac sodium  2 g Topical QID  . guaiFENesin  1,200 mg Oral BID  . heparin  5,000 Units Subcutaneous 3 times per day  . insulin aspart  0-9 Units Subcutaneous Q4H  . ipratropium-albuterol  3 mL Nebulization Q4H  . irbesartan  75 mg Oral Daily  . [START ON 05/28/2015] levofloxacin  500 mg Oral Q48H  . levothyroxine  50 mcg Intravenous Daily  . lidocaine  1 patch Transdermal Q24H  . methylPREDNISolone (SOLU-MEDROL) injection  60 mg Intravenous Q6H  . polyethylene glycol  34 g Oral Daily  . sodium chloride  3 mL Intravenous Q12H  . sodium chloride  3 mL Intravenous Q12H    Time spent on care of this patient: 40 mins   [name], [name] , MD  [facility] Office  [phone] Pager - [phone]  On-Call/Text Page:      Michelle [email or web address]      password [email or web address]  If 7PM-7AM, please contact night-coverage [email or web address] Password [email or web address] 05/26/2015, 7:57 PM   LOS: 1 day   Care during the described time interval was provided by me .  I have reviewed this patient's available data, including medical history, events of note, physical examination, and all test results as part of my evaluation. I have personally reviewed and interpreted all radiology studies.   Michelle Littlesurtis Brandom Kerwin, MD [phone] Pager

## 2015-05-26 NOTE — Progress Notes (Addendum)
CRITICAL VALUE ALERT  Critical value received:  Lactic Acid 3.2  Date of notification:  05/26/2015  Time of notification:  1116  Critical value read back:Yes.    Nurse who received alert:  Receive report by Lora Paulaarolyn White RN. Reported to primary [facility].  MD notified (1st page):  Dr Joseph ArtWoods  Time of first page:  1130  MD notified (2nd page):  Time of second page:  Responding MD:  Dr Joseph ArtWoods   Time MD responded:  1130 MD was on the unit.

## 2015-05-26 NOTE — Progress Notes (Signed)
Utilization Review Completed.Anhthu Perdew T1/04/2016  

## 2015-05-26 NOTE — Progress Notes (Signed)
05/26/2015 RN notice patient urine at 1830 was pink tinge. Lovie MacadamiaNadine Billye Pickerel RN

## 2015-05-26 NOTE — Progress Notes (Signed)
OT Cancellation Note  Patient Details Name: [name]: [id number] DOB: July 13, 1923   Cancelled Treatment:    Reason Eval/Treat Not Completed: Medical issues which prohibited therapy. Pt with respiratory distress, placed on bipap earlier today. Will follow.  Evern BioMayberry, [name] 05/26/2015, 3:16 PM  [phone]

## 2015-05-26 NOTE — Progress Notes (Signed)
05/26/2015 Patient had foley place at 1052 by March RummageStephen Coltrane. She had clear yellow urine. Lovie MacadamiaNadine Andjela Wickes RN

## 2015-05-26 NOTE — Progress Notes (Signed)
Inpatient Diabetes Program Recommendations  AACE/ADA: New Consensus Statement on Inpatient Glycemic Control (2015)  Target Ranges:  Prepandial:   less than 140 mg/dL      Peak postprandial:   less than 180 mg/dL (1-2 hours)      Critically ill patients:  140 - 180 mg/dL   Review of Glycemic Control  Inpatient Diabetes Program Recommendations:    Noted patient's glucose dropped from 338 to 155 in just over an hour while on the GlucoStabilizer this am. RN changed the multiplier from .04 to .01 as needed and turned the drip off in the next hour. Patient is NPO and unless patient is getting IV meds containing dextrose, she has no source of glucose to work with her insulin. The glucostabilizer order set (for glucose control only) can be used rather than the DKA order set as GS order set specifies when to add dextrose to IV fluids, when appropriate to discontinue and other guidelines. Unless there is a contraindication to using D5NS at 100-125 ml/hr for this patient at this time, please add the dextrose as stated on the order set once glucose has dropped below 200 mg/dL to prevent potential hypoglycemia while on the insulin drip. The IV insulin drip is typically the safest method to control glucose unless there are contraindicating factors. Have text paged MD to this unit requesting the D5NS.  Thank you Lenor CoffinAnn Georges Victorio, RN, MSN, CDE  Diabetes Inpatient Program Office: [phone] Pager: [phone] 8:00 am to 5:00 pm

## 2015-05-26 NOTE — Progress Notes (Addendum)
05/26/2015 Patient c/o today that she could not void. Bladder scan was done by nurse tech, per tech patient bladder was empty. Rn reported to Dr Joseph ArtWoods. Damarkus Balis WellingtonRN.

## 2015-05-26 NOTE — Progress Notes (Addendum)
05/26/2015 Patient was on bipap and was not alert enough. RN reported to Dr Joseph ArtWoods oral meds was not given today. [facility] Center[facility]ine Lateya Dauria RN.

## 2015-05-26 NOTE — Progress Notes (Signed)
Patient noted to be in some mild respiratory distress. Extreme expiratory wheezes. Increase WOB. Patient has bipap PRN ordered. RT placed patient on bipap. Patient noted to have some relief. No complications. Vital signs are stable at this time. RT will continue to monitor.

## 2015-05-26 NOTE — Progress Notes (Signed)
*  PRELIMINARY RESULTS* Echocardiogram 2D Echocardiogram has been performed.  Jeryl Columbialliott, Raylen Ken 05/26/2015, 4:49 PM

## 2015-05-26 NOTE — Progress Notes (Signed)
CRITICAL VALUE ALERT  Critical value received:  Glucose on BMP 641  Date of notification:  05/26/15  Time of notification:  0404  Critical value read back: Yes  Nurse who received alert:  L. Salomon Mastobson, RN  MD notified (1st page):  Donnamarie PoagK. Kirby  Time of first page:  0405  Responding MD:  Donnamarie PoagK. Kirby  Time MD responded:  [phone]

## 2015-05-26 NOTE — Progress Notes (Signed)
Initial Nutrition Assessment  DOCUMENTATION CODES:   Not applicable  INTERVENTION:   Advance diet as medically appropriate, add supplements when/as able  NUTRITION DIAGNOSIS:   Increased nutrient needs related to chronic illness as evidenced by estimated needs  GOAL:   Patient will meet greater than or equal to 90% of their needs  MONITOR:   PO intake, Supplement acceptance, Labs, Weight trends, I & O's  REASON FOR ASSESSMENT:   Consult COPD Protocol  ASSESSMENT:   82 yo Female presented with approximately 24 hours of acute worsening shortness of breath and cough.  RD unable to obtain nutrition hx or complete Nutrition Focused Physical Exam at this time.  Pt was on BiPAP earlier today.  Nutrient needs increased given COPD.  Currently NPO.  RD to monitor diet advancement, add supplements as needed.  Diet Order:  Diet NPO time specified  Skin:  Reviewed, no issues  Last BM:  1/11  Height:   Ht Readings from Last 1 Encounters:  05/26/15 5\' 1"  (1.549 m)    Weight:   Wt Readings from Last 1 Encounters:  05/26/15 143 lb 11.8 oz (65.2 kg)    Ideal Body Weight:  47.7 kg  BMI:  Body mass index is 27.17 kg/(m^2).  Estimated Nutritional Needs:   Kcal:  1600-1800  Protein:  70-80 gm  Fluid:  1.6-1.8 L  EDUCATION NEEDS:   No education needs identified at this time  Maureen ChattersKatie Dayan Kreis, RD, LDN Pager #: [phone] After-Hours Pager #: [phone]

## 2015-05-26 NOTE — Progress Notes (Signed)
RT unavailable for bipap check at 16:00 due to medical emergency elsewhere in hospital. RT will continue to monitor and bipap check will be completed at 20:00

## 2015-05-26 NOTE — Progress Notes (Signed)
CRITICAL VALUE ALERT  Critical value received:  Lactic Acid 3.6  Date of notification:  05/26/2015  Time of notification:  1336  Critical value read back:Yes.    Nurse who received alert:  Report taken by Coralyn MarkWalley Rick Nursing director and reported to Lovie MacadamiaNadine Malissa Slay RN  MD notified (1st page):  Dr Lucretia RoersWood was text via Amion  Time of first page:  1355  MD notified (2nd page):  Time of second page:  Responding MD:  Dr Joseph ArtWoods   Time MD responded:  1400

## 2015-05-26 NOTE — Progress Notes (Signed)
RN paged because pt's glucose on am BMP was over 600. No open anion gap and bicarb normal.  1. HONK-start insulin drip. RN to call when sugars less than 150. No IVF as pt has CHF. BMPs q4 x 2.  KJKG, NP [facility]

## 2015-05-27 ENCOUNTER — Inpatient Hospital Stay (HOSPITAL_COMMUNITY): Payer: Medicare Other

## 2015-05-27 DIAGNOSIS — Z794 Long term (current) use of insulin: Secondary | ICD-10-CM

## 2015-05-27 DIAGNOSIS — E1100 Type 2 diabetes mellitus with hyperosmolarity without nonketotic hyperglycemic-hyperosmolar coma (NKHHC): Secondary | ICD-10-CM | POA: Diagnosis present

## 2015-05-27 DIAGNOSIS — J9601 Acute respiratory failure with hypoxia: Secondary | ICD-10-CM | POA: Diagnosis present

## 2015-05-27 DIAGNOSIS — E872 Acidosis, unspecified: Secondary | ICD-10-CM | POA: Diagnosis present

## 2015-05-27 DIAGNOSIS — J9602 Acute respiratory failure with hypercapnia: Secondary | ICD-10-CM

## 2015-05-27 DIAGNOSIS — J189 Pneumonia, unspecified organism: Secondary | ICD-10-CM

## 2015-05-27 DIAGNOSIS — I5032 Chronic diastolic (congestive) heart failure: Secondary | ICD-10-CM

## 2015-05-27 DIAGNOSIS — I248 Other forms of acute ischemic heart disease: Secondary | ICD-10-CM

## 2015-05-27 LAB — URINE CULTURE: Culture: 9000

## 2015-05-27 LAB — COMPREHENSIVE METABOLIC PANEL
ALT: 19 U/L (ref 14–54)
AST: 36 U/L (ref 15–41)
Albumin: 2.7 g/dL — ABNORMAL LOW (ref 3.5–5.0)
Alkaline Phosphatase: 44 U/L (ref 38–126)
Anion gap: 8 (ref 5–15)
BUN: 56 mg/dL — AB (ref 6–20)
CHLORIDE: 108 mmol/L (ref 101–111)
CO2: 30 mmol/L (ref 22–32)
Calcium: 8.1 mg/dL — ABNORMAL LOW (ref 8.9–10.3)
Creatinine, Ser: 1.18 mg/dL — ABNORMAL HIGH (ref 0.44–1.00)
GFR calc Af Amer: 46 mL/min — ABNORMAL LOW (ref >60)
GFR calc non Af Amer: 40 mL/min — ABNORMAL LOW (ref >60)
GLUCOSE: 227 mg/dL — AB (ref 65–99)
POTASSIUM: 5 mmol/L (ref 3.5–5.1)
Sodium: 146 mmol/L — ABNORMAL HIGH (ref 135–145)
Total Bilirubin: 0.5 mg/dL (ref 0.3–1.2)
Total Protein: 5.9 g/dL — ABNORMAL LOW (ref 6.5–8.1)

## 2015-05-27 LAB — CBC WITH DIFFERENTIAL/PLATELET
Basophils Absolute: 0 10*3/uL (ref 0.0–0.1)
Basophils Relative: 0 %
EOS PCT: 0 %
Eosinophils Absolute: 0 10*3/uL (ref 0.0–0.7)
HEMATOCRIT: 29.5 % — AB (ref 36.0–46.0)
Hemoglobin: 9.4 g/dL — ABNORMAL LOW (ref 12.0–15.0)
LYMPHS ABS: 0.3 10*3/uL — AB (ref 0.7–4.0)
LYMPHS PCT: 4 %
MCH: 29.8 pg (ref 26.0–34.0)
MCHC: 31.9 g/dL (ref 30.0–36.0)
MCV: 93.7 fL (ref 78.0–100.0)
MONO ABS: 0.6 10*3/uL (ref 0.1–1.0)
Monocytes Relative: 7 %
Neutro Abs: 7.6 10*3/uL (ref 1.7–7.7)
Neutrophils Relative %: 89 %
PLATELETS: 268 10*3/uL (ref 150–400)
RBC: 3.15 MIL/uL — AB (ref 3.87–5.11)
RDW: 14.6 % (ref 11.5–15.5)
WBC: 8.6 10*3/uL (ref 4.0–10.5)

## 2015-05-27 LAB — GLUCOSE, CAPILLARY
GLUCOSE-CAPILLARY: 288 mg/dL — AB (ref 65–99)
GLUCOSE-CAPILLARY: 290 mg/dL — AB (ref 65–99)
GLUCOSE-CAPILLARY: 207 mg/dL — AB (ref 65–99)
GLUCOSE-CAPILLARY: 268 mg/dL — AB (ref 65–99)
Glucose-Capillary: 182 mg/dL — ABNORMAL HIGH (ref 65–99)
Glucose-Capillary: 250 mg/dL — ABNORMAL HIGH (ref 65–99)

## 2015-05-27 LAB — LEGIONELLA ANTIGEN, URINE

## 2015-05-27 LAB — LACTIC ACID, PLASMA: Lactic Acid, Venous: 1.3 mmol/L (ref 0.5–2.0)

## 2015-05-27 LAB — HEMOGLOBIN A1C
HEMOGLOBIN A1C: 9 % — AB (ref 4.8–5.6)
Mean Plasma Glucose: 212 mg/dL

## 2015-05-27 LAB — MAGNESIUM: Magnesium: 2.9 mg/dL — ABNORMAL HIGH (ref 1.7–2.4)

## 2015-05-27 MED ORDER — DEXTROSE-NACL 5-0.45 % IV SOLN
INTRAVENOUS | Status: DC
  Administered 2015-05-27 – 2015-05-28 (×2): via INTRAVENOUS

## 2015-05-27 MED ORDER — GUAIFENESIN-DM 100-10 MG/5ML PO SYRP
5.0000 mL | ORAL_SOLUTION | ORAL | Status: DC | PRN
Start: 2015-05-27 — End: 2015-05-28
  Administered 2015-05-28: 5 mL via ORAL
  Filled 2015-05-27: qty 5

## 2015-05-27 NOTE — Progress Notes (Signed)
Inpatient Diabetes Program Recommendations  AACE/ADA: New Consensus Statement on Inpatient Glycemic Control (2015)  Target Ranges:  Prepandial:   less than 140 mg/dL      Peak postprandial:   less than 180 mg/dL (1-2 hours)      Critically ill patients:  140 - 180 mg/dL   Review of Glycemic Control  Inpatient Diabetes Program Recommendations:  Correction (SSI): increase to moderate scale during steroid therapy Thank you  Piedad ClimesGina Umer Harig BSN, RN,CDE Inpatient Diabetes Coordinator [phone] (team pager)

## 2015-05-27 NOTE — Evaluation (Signed)
Physical Therapy Evaluation Patient Details Name: [name] MRN: [id number] DOB: [birth year] Today's Date: 05/27/2015   History of Present Illness  Patient presenting with approximately 24 hours of acute worsening shortness of breath and cough. Patient is on baseline 1.5 L nasal cannula with intermittent dry cough. Husband recently in the home with pneumonia. Patient developed a wet cough over the last 24 hours. Home O2 was increased to 2 L by nasal cannula with respiratory rate never going above 20 and O2 sats never below 94%.  Has been on and off Bipap since admit.   Clinical Impression  Pt admitted with above diagnosis. Pt currently with functional limitations due to the deficits listed below (see PT Problem List). Pt with significant functional limitations due to weakness as well as significant respiratory issues with very littlle respiratory reserve.  Will possibly need SNF at d/c as family and Hospice has been caring for pt and unsure if they can provide level of care pt will need.  Will follow acutely.   Pt will benefit from skilled PT to increase their independence and safety with mobility to allow discharge to the venue listed below.      Follow Up Recommendations SNF;Supervision/Assistance - 24 hour    Equipment Recommendations  Other (comment) (TBA)    Recommendations for Other Services       Precautions / Restrictions Precautions Precautions: Fall Restrictions Weight Bearing Restrictions: No      Mobility  Bed Mobility Overal bed mobility: Needs Assistance;+2 for physical assistance Bed Mobility: Supine to Sit;Sit to Supine     Supine to sit: Total assist;+2 for physical assistance;HOB elevated Sit to supine: Total assist;+2 for physical assistance   General bed mobility comments: Pt did not assist to come to EOB or to lie down.   Transfers                    Ambulation/Gait                Stairs            Wheelchair Mobility     Modified Rankin (Stroke Patients Only)       Balance Overall balance assessment: Needs assistance Sitting-balance support: Bilateral upper extremity supported;Feet supported Sitting balance-Leahy Scale: Zero Sitting balance - Comments: Pt sat EOB 3 minutes with pt attempting to sit up but needing mod to max assist for postural stability due to struggling to breathe.  Assisted back to supine per pt request. Pt wanted to lie on side therefore assisted her to comfortable position with pillows.                                       Pertinent Vitals/Pain Pain Assessment: Faces Faces Pain Scale: Hurts even more Pain Location: generalized  Pain Descriptors / Indicators: Grimacing;Guarding (with movement) Pain Intervention(s): Limited activity within patient's tolerance;Monitored during session;Repositioned  Pt on 2LO2 on arrival with sats 98% with labored breathing on arrival.  Turned O2 up to 5L for sitting EOb and sats 95-98%.  Placed back to 2LO2 at end of session with continued labored breathing.  Nursing aware.  HR 120-126 bpm.     Home Living Family/patient expects to be discharged to:: Skilled nursing facility Living Arrangements: Children;Other relatives;Spouse/significant other Available Help at Discharge: Family;Available 24 hours/day Type of Home: House Home Access: Stairs to enter Entrance Stairs-Rails: Right Entrance Stairs-Number of Steps: 4  Home Layout: Two level;Able to live on main level with bedroom/bathroom Home Equipment: Grab bars - toilet;Grab bars - tub/shower;Toilet riser;Walker - 2 wheels;Tub bench;Bedside commode;Wheelchair - manual;Hospital bed (home O2)      Prior Function Level of Independence: Needs assistance   Gait / Transfers Assistance Needed: used walker with assist per relatives in room   ADL's / Homemaking Assistance Needed: assist prn  Comments: children cook and assist in home. Daughter reports pt could do for herself until her  back began hurting a week ago and then she had to assist pt more and more     Hand Dominance   Dominant Hand: Right    Extremity/Trunk Assessment   Upper Extremity Assessment: Defer to OT evaluation           Lower Extremity Assessment: RLE deficits/detail;LLE deficits/detail RLE Deficits / Details: unable to assess due to pt rigid and not following commands  LLE Deficits / Details: unaable to assess due to pt rigid and not following commands  Cervical / Trunk Assessment: Kyphotic  Communication   Communication: Other (comment) (did not speak.  Labored breathing. Shook head to yes and no )  Cognition Arousal/Alertness: Lethargic Behavior During Therapy: Flat affect;Anxious Overall Cognitive Status: Difficult to assess                      General Comments      Exercises        Assessment/Plan    PT Assessment Patient needs continued PT services  PT Diagnosis Generalized weakness   PT Problem List Decreased activity tolerance;Decreased balance;Decreased strength;Decreased range of motion;Decreased mobility;Decreased coordination;Decreased cognition;Decreased knowledge of use of DME;Decreased safety awareness;Decreased knowledge of precautions;Cardiopulmonary status limiting activity;Pain  PT Treatment Interventions DME instruction;Gait training;Functional mobility training;Therapeutic activities;Therapeutic exercise;Balance training;Patient/family education;Cognitive remediation   PT Goals (Current goals can be found in the Care Plan section) Acute Rehab PT Goals Patient Stated Goal: unable to state PT Goal Formulation: Patient unable to participate in goal setting Time For Goal Achievement: 06/10/15 Potential to Achieve Goals: Fair    Frequency Min 3X/week   Barriers to discharge        Co-evaluation               End of Session Equipment Utilized During Treatment: Gait belt;Oxygen Activity Tolerance: Patient limited by fatigue;Patient limited  by lethargy Patient left: in bed;with call bell/phone within reach;with bed alarm set;with family/visitor present Nurse Communication: Mobility status;Need for lift equipment         Time: 1146-1200 PT Time Calculation (min) (ACUTE ONLY): 14 min   Charges:   PT Evaluation $PT Eval Moderate Complexity: 1 Procedure     PT G CodesBerline Lopes 2015/06/15, 4:42 PM  Levorn Oleski,PT Acute Rehabilitation [phone] (pager)

## 2015-05-27 NOTE — Progress Notes (Signed)
[facility] TEAM 1 - Stepdown/ICU TEAM Progress Note  [name] WJX:[id number] DOB: 1924-01-26 DOA: 05/30/2015 PCP: Ginette Otto, MD  Admit HPI / Brief Narrative: 82 y.o. female Level V caveat: History provided by EDP and patient's daughter who is a close caretaker. Patient currently unable to give any significant contributions to history due to being on BiPAP and acute respiratory failure. PMHx Chronic Respiratory Failure, COPD on home O2 1.5 L via [place],Diastolic CHF HLD, Hypothyroidism, Chronic Pain Syndrome  Patient presenting with approximately 24 hours of acute worsening shortness of breath and cough. Patient is on baseline 1.5 L nasal cannula with intermittent dry cough. Husband recently in the home with pneumonia. Patient developed a wet cough over the last 24 hours. Home O2 was increased to 2 L by nasal cannula with respiratory rate never going above 20 and O2 sats never below 94%. Denies any fevers though patient did complain of having a damp head after possible subjective fevers. Cough is now wet. Home O2 and breathing treatments with minimal benefit.  Of note patient fell on 05/20/2015 and struck her left posterior rib cage. This is caused her to breathe more shallowly since that time. Occasional Norco with some improvement in the pain.  No bowel movement in the last 4 days. This typically happens to her after starting narcotics. At baseline the patient takes for Senokot tablets daily for constipation.  Patient's daughter started her on doxycycline 100 mg twice daily last night. This was started from a previous prescription that the patient had a home.  Patient's daughter also complaining of foul-smelling urine from the patient. Denies dysuria, frequency, flank pain. Has not had a UTI in many many years.   HPI/Subjective: 1/13 much more awake this morning, still having difficulty breathing but improved able to answer questions appropriately. Currently stopped BiPAP but  patient working hard will most likely tire and have to be restarted later in the day. States okay with using flutter valve and physiotherapy vest  Assessment/Plan: Acute on chronic respiratory failure/COPD Exacerbation/CAP - Requiring BiPAP. ABG showing pH 7.3, PCO2 60.2, PO2 256, bicarbonate 29.7. Patient somewhat somnolent but arousable and oriented. Lactic acid 2.02. BNP 557. Apparent COPD exacerbation with mild pulmonary congestion suggestive of very mild CHF. Last echo showing preserved EF with severe hypokinesis of apex - DuoNeb every 4 - Solu-Medrol 60 mg every 6 (Home daily prednisone 10mg ) - Mucinex DM -Continue Levofloxacin  -Change to D5-0.45% saline 75 ml/hr -Monitor closely for fluid overload -Flutter valve -Physiotherapy vest BID  Lactic acidosis - Elevated on admission resolving--> 3.2-->1.3 -See acute on chronic respiratory failure  Chronic diastolic CHF -Echocardiogram; preserved EF diastolic dysfunction could not be ruled out -Strict in and out since admission +1.1 L -Daily a.m. Weight admission weight= not obtained          1/13 weight= 66.4 kg  Elevated Troponin/Demand ischemia:  -0.21 on admission. 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 Suspect demand - cycle trop - Increase to full dose ASA - Continue plavix  Left Rib contusion:  -L rib contusion from fall on 05/20/15. No fracture on plain film. - Incentive spirometer.  - lidoderm patch - Voltaren gel  UTI? -Urine culture pending   -Most common UTIs covered by current ABx regimen  Acute on chronic renal failure -Improving with hydration, continue monitor  Constipation: -opioid exacerbation of chronic constipation - Increase Senokot. (6 pills daily at home) - Miralax - Consider enema -KUB' moderate stool, will hold on treating until patient  more stable  Hypothyroid: - continue synthroid IV 50 g daily  DM Type 2 uncontrolled Hyperglycemia -1/12 hemoglobin A1c = 9.0     HLD    -lipid panel not within ADA guidelines -  HTN: -Irbesartan 75 mg when mentation improves/off BiPAP -PRN Hydralazine IV  SBP> 160 or DBP> 100  Sinus tachycardia -Most likely secondary to infection and dehydration  Altered mental status  -Appears to be multifactorial to include UTI, uremia, and COPD exacerbation    Code Status: DO NOT RESUSCITATE Family Communication: Daughter present at time of exam Disposition Plan: SNF?    Consultants: NA  Procedure/Significant Events: 1/12 echocardiogram;- LVEF= 55%- 60%. Although no diagnostic regional wall motion abnormality was identified.   1/12 KUB;Moderate stool throughout colon. No frank obstruction or free air seen   Culture 1/12 urine pending 1/12 influenza negative 1/12 respiratory virus panel pending 1/12 strep pneumo urine antigen/Legionella urine antigen negative    1/12 sputum pending   Antibiotics: Doxycycline 1/11>> 1/12 Levofloxacin 1/12  DVT prophylaxis: NA   Devices NA   LINES / TUBES:  NA    Continuous Infusions: . dextrose 5 % and 0.45% NaCl 75 mL/hr at 05/27/15 1115    Objective: VITAL SIGNS: Temp: 97.9 F (36.6 C) (01/13 0800) Temp Source: Axillary (01/13 0800) BP: 130/64 mmHg (01/13 0800) Pulse Rate: 82 (01/13 0800) SPO2; FIO2:   Intake/Output Summary (Last 24 hours) at 05/27/15 1202 Last data filed at 05/27/15 1191  Gross per 24 hour  Intake   1575 ml  Output   1075 ml  Net    500 ml     Exam: General: Alert, off BiPAP able to answer questions appropriately, still positive Acute on chronic Respiratory distress Eyes:negative scleral hemorrhage ENT: Negative Runny nose, negative gingival bleeding, Neck:  Negative scars, masses, torticollis, lymphadenopathy, JVD Lungs: diffuse rhonchi, positive moderate Expiratory wheezing, negative crackles.  Cardiovascular: Tachycardic, Regular rhythm without murmur gallop or rub normal S1 and S2 Abdomen:  Pain to palpation, positive  distended, positive soft, bowel sounds, positive tympany, no rebound, no ascites, no appreciable mass Extremities: No significant cyanosis, clubbing, or edema bilateral lower extremities Psychiatric:  Unable to assess secondary to altered mental status  Neurologic: Follows all commands, alert, .   Data Reviewed: Basic Metabolic Panel:  Recent Labs Lab 06-04-2015 1313 2015/06/04 1742 05/26/15 0300 05/26/15 0748 05/26/15 1130 05/27/15 0247  NA 136  --  135 139 142 146*  K 4.8  --  5.2* 4.6 4.1 5.0  CL 100*  --  96* 101 101 108  CO2 26  --  27 26 26 30   GLUCOSE 269*  --  641* 424* 207* 227*  BUN 54*  --  61* 60* 62* 56*  CREATININE 1.22*  --  1.58* 1.45* 1.43* 1.18*  CALCIUM 8.2*  --  7.9* 8.3* 8.6* 8.1*  MG  --  2.9*  --   --   --  2.9*   Liver Function Tests:  Recent Labs Lab 06/04/2015 1313 05/26/15 0300 05/27/15 0247  AST 24 26 36  ALT 17 18 19   ALKPHOS 50 52 44  BILITOT 0.6 0.5 0.5  PROT 6.0* 6.1* 5.9*  ALBUMIN 2.9* 3.0* 2.7*   No results for input(s): LIPASE, AMYLASE in the last 168 hours. No results for input(s): AMMONIA in the last 168 hours. CBC:  Recent Labs Lab 06/04/15 1313 05/26/15 0300 05/27/15 0247  WBC 10.1 5.8 8.6  NEUTROABS  --   --  7.6  HGB 9.8* 9.8*  9.4*  HCT 31.9* 31.2* 29.5*  MCV 94.7 92.9 93.7  PLT 249 269 268   Cardiac Enzymes:  Recent Labs Lab 2015/12/23 1742 2015/12/23 2059 05/26/15 0300  TROPONINI 0.29* 0.34* 0.22*   BNP (last 3 results)  Recent Labs  2015/12/23 1313  BNP 557.4*    ProBNP (last 3 results) No results for input(s): PROBNP in the last 8760 hours.  CBG:  Recent Labs Lab 05/26/15 1913 05/26/15 2116 05/26/15 2340 05/27/15 0507 05/27/15 0814  GLUCAP 105* 156* 182* 250* 288*    Recent Results (from the past 240 hour(s))  MRSA PCR Screening     Status: None   Collection Time: 05/26/15  1:26 AM  Result Value Ref Range Status   MRSA by PCR NEGATIVE NEGATIVE Final    Comment:        The GeneXpert MRSA  Assay (FDA approved for NASAL specimens only), is one component of a comprehensive MRSA colonization surveillance program. It is not intended to diagnose MRSA infection nor to guide or monitor treatment for MRSA infections.      Studies:  Recent x-ray studies have been reviewed in detail by the Attending Physician  Scheduled Meds:  Scheduled Meds: . antiseptic oral rinse  7 mL Mouth Rinse BID  . aspirin EC  325 mg Oral Daily  . clopidogrel  75 mg Oral Daily  . diclofenac sodium  2 g Topical QID  . guaiFENesin  1,200 mg Oral BID  . heparin  5,000 Units Subcutaneous 3 times per day  . insulin aspart  0-9 Units Subcutaneous Q4H  . ipratropium-albuterol  3 mL Nebulization Q4H  . irbesartan  75 mg Oral Daily  . [START ON 05/28/2015] levofloxacin  500 mg Oral Q48H  . levothyroxine  50 mcg Intravenous Q24H  . lidocaine  1 patch Transdermal Q24H  . methylPREDNISolone (SOLU-MEDROL) injection  60 mg Intravenous Q6H  . polyethylene glycol  34 g Oral Daily  . sodium chloride  3 mL Intravenous Q12H  . sodium chloride  3 mL Intravenous Q12H    Time spent on care of this patient: 40 mins   WOODS, Roselind MessierURTIS J , MD  [facility] Office  [phone] Pager - [phone]  On-Call/Text Page:      [email or web address]      password [email or web address]  If 7PM-7AM, please contact night-coverage [email or web address] Password [email or web address] 05/27/2015, 12:02 PM   LOS: 2 days   Care during the described time interval was provided by me .  I have reviewed this patient's available data, including medical history, events of note, physical examination, and all test results as part of my evaluation. I have personally reviewed and interpreted all radiology studies.   Carolyne Littlesurtis Woods, MD [phone] Pager

## 2015-05-27 NOTE — Care Management Note (Signed)
Case Management Note  Patient Details  Name: [name] MRN: [id number] Date of Birth: 1923-06-03  Subjective/Objective:  Pt lives with dtr and son-in-law.  Per chart review, she was active with [facility] in 2014.  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  Pt has hospital bed, w/c, and home O2.                          Expected Discharge Plan:  Home w Hospice Care  Discharge planning Services  CM Consult  Status of Service:  In process, will continue to follow  Magdalene RiverMayo, Elaura Calix T, RN 05/27/2015, 3:03 PM

## 2015-05-27 NOTE — Progress Notes (Signed)
OT Cancellation Note  Patient Details Name: [name] MRN: [id number] DOB: 1923-07-21   Cancelled Treatment:    Reason Eval/Treat Not Completed: Medical issues which prohibited therapy Spoke with PT Dawn and RN Nadine regarding holding due to accessory breathing at this time. Pt removed from bipap. Ot to hold for a more appropriate time .   Mateo FlowJones, Brynn   OTR/L Pager: [phone] Office: [phone] .   Boone MasterJones, Durinda Buzzelli B 05/27/2015, 12:38 PM

## 2015-05-28 DIAGNOSIS — I10 Essential (primary) hypertension: Secondary | ICD-10-CM

## 2015-05-28 DIAGNOSIS — T402X5A Adverse effect of other opioids, initial encounter: Secondary | ICD-10-CM | POA: Diagnosis present

## 2015-05-28 DIAGNOSIS — K5903 Drug induced constipation: Secondary | ICD-10-CM | POA: Diagnosis present

## 2015-05-28 LAB — CBC WITH DIFFERENTIAL/PLATELET
BASOS ABS: 0 10*3/uL (ref 0.0–0.1)
BASOS PCT: 0 %
EOS ABS: 0 10*3/uL (ref 0.0–0.7)
EOS PCT: 0 %
HCT: 29.4 % — ABNORMAL LOW (ref 36.0–46.0)
Hemoglobin: 9.2 g/dL — ABNORMAL LOW (ref 12.0–15.0)
LYMPHS PCT: 4 %
Lymphs Abs: 0.3 10*3/uL — ABNORMAL LOW (ref 0.7–4.0)
MCH: 30.2 pg (ref 26.0–34.0)
MCHC: 31.3 g/dL (ref 30.0–36.0)
MCV: 96.4 fL (ref 78.0–100.0)
MONO ABS: 0.3 10*3/uL (ref 0.1–1.0)
Monocytes Relative: 5 %
Neutro Abs: 6.7 10*3/uL (ref 1.7–7.7)
Neutrophils Relative %: 91 %
PLATELETS: 254 10*3/uL (ref 150–400)
RBC: 3.05 MIL/uL — AB (ref 3.87–5.11)
RDW: 14.8 % (ref 11.5–15.5)
WBC: 7.3 10*3/uL (ref 4.0–10.5)

## 2015-05-28 LAB — COMPREHENSIVE METABOLIC PANEL
ALBUMIN: 2.6 g/dL — AB (ref 3.5–5.0)
ALT: 19 U/L (ref 14–54)
ANION GAP: 7 (ref 5–15)
AST: 29 U/L (ref 15–41)
Alkaline Phosphatase: 43 U/L (ref 38–126)
BUN: 47 mg/dL — ABNORMAL HIGH (ref 6–20)
CALCIUM: 8 mg/dL — AB (ref 8.9–10.3)
CHLORIDE: 112 mmol/L — AB (ref 101–111)
CO2: 29 mmol/L (ref 22–32)
Creatinine, Ser: 1.2 mg/dL — ABNORMAL HIGH (ref 0.44–1.00)
GFR calc non Af Amer: 39 mL/min — ABNORMAL LOW (ref >60)
GFR, EST AFRICAN AMERICAN: 45 mL/min — AB (ref >60)
GLUCOSE: 347 mg/dL — AB (ref 65–99)
Potassium: 4.5 mmol/L (ref 3.5–5.1)
SODIUM: 148 mmol/L — AB (ref 135–145)
TOTAL PROTEIN: 5.8 g/dL — AB (ref 6.5–8.1)
Total Bilirubin: 0.3 mg/dL (ref 0.3–1.2)

## 2015-05-28 LAB — RESPIRATORY VIRUS PANEL
Adenovirus: NEGATIVE
INFLUENZA A: NEGATIVE
INFLUENZA B 1: NEGATIVE
METAPNEUMOVIRUS: POSITIVE — AB
PARAINFLUENZA 2 A: NEGATIVE
PARAINFLUENZA 3 A: NEGATIVE
Parainfluenza 1: NEGATIVE
Respiratory Syncytial Virus A: NEGATIVE
Respiratory Syncytial Virus B: NEGATIVE
Rhinovirus: NEGATIVE

## 2015-05-28 LAB — GLUCOSE, CAPILLARY
GLUCOSE-CAPILLARY: 210 mg/dL — AB (ref 65–99)
GLUCOSE-CAPILLARY: 243 mg/dL — AB (ref 65–99)
GLUCOSE-CAPILLARY: 290 mg/dL — AB (ref 65–99)
GLUCOSE-CAPILLARY: 309 mg/dL — AB (ref 65–99)
GLUCOSE-CAPILLARY: 366 mg/dL — AB (ref 65–99)
Glucose-Capillary: 256 mg/dL — ABNORMAL HIGH (ref 65–99)

## 2015-05-28 LAB — MAGNESIUM: MAGNESIUM: 3.1 mg/dL — AB (ref 1.7–2.4)

## 2015-05-28 MED ORDER — SENNA 8.6 MG PO TABS
2.0000 | ORAL_TABLET | Freq: Two times a day (BID) | ORAL | Status: DC
  Administered 2015-05-28 – 2015-05-30 (×5): 17.2 mg via ORAL
  Filled 2015-05-28 (×5): qty 2

## 2015-05-28 MED ORDER — GUAIFENESIN-DM 100-10 MG/5ML PO SYRP
5.0000 mL | ORAL_SOLUTION | ORAL | Status: DC
  Administered 2015-05-28 – 2015-05-30 (×8): 5 mL via ORAL
  Filled 2015-05-28 (×9): qty 5

## 2015-05-28 MED ORDER — METOCLOPRAMIDE HCL 5 MG/ML IJ SOLN
5.0000 mg | Freq: Three times a day (TID) | INTRAMUSCULAR | Status: DC
  Administered 2015-05-28 – 2015-05-30 (×8): 5 mg via INTRAVENOUS
  Filled 2015-05-28 (×8): qty 2

## 2015-05-28 MED ORDER — DEXTROSE 5 % IV SOLN
INTRAVENOUS | Status: DC
  Administered 2015-05-28 (×2): via INTRAVENOUS

## 2015-05-28 MED ORDER — INSULIN ASPART 100 UNIT/ML ~~LOC~~ SOLN
0.0000 [IU] | SUBCUTANEOUS | Status: DC
  Administered 2015-05-28: 15 [IU] via SUBCUTANEOUS
  Administered 2015-05-28: 11 [IU] via SUBCUTANEOUS
  Administered 2015-05-28 – 2015-05-29 (×7): 7 [IU] via SUBCUTANEOUS
  Administered 2015-05-30: 4 [IU] via SUBCUTANEOUS
  Administered 2015-05-30: 7 [IU] via SUBCUTANEOUS
  Administered 2015-05-30: 4 [IU] via SUBCUTANEOUS

## 2015-05-28 MED ORDER — FUROSEMIDE 10 MG/ML IJ SOLN
40.0000 mg | Freq: Once | INTRAMUSCULAR | Status: AC
  Administered 2015-05-28: 40 mg via INTRAVENOUS
  Filled 2015-05-28: qty 4

## 2015-05-28 MED ORDER — ARFORMOTEROL TARTRATE 15 MCG/2ML IN NEBU
15.0000 ug | INHALATION_SOLUTION | Freq: Two times a day (BID) | RESPIRATORY_TRACT | Status: DC
  Administered 2015-05-28 – 2015-05-30 (×5): 15 ug via RESPIRATORY_TRACT
  Filled 2015-05-28 (×5): qty 2

## 2015-05-28 MED ORDER — METHOCARBAMOL 1000 MG/10ML IJ SOLN
500.0000 mg | Freq: Three times a day (TID) | INTRAVENOUS | Status: DC | PRN
  Administered 2015-05-28: 500 mg via INTRAVENOUS
  Filled 2015-05-28 (×4): qty 5

## 2015-05-28 NOTE — Progress Notes (Signed)
[facility] TEAM 1 - Stepdown/ICU TEAM Progress Note  Talmadge ChadMargie G Clingerman ION:[id number] DOB: 1923/06/04 DOA: October 21, 2015 PCP: Ginette OttoSTONEKING,HAL THOMAS, MD  Admit HPI / Brief Narrative: 82 y.o. female Level V caveat: History provided by EDP and patient's daughter who is a close caretaker. Patient currently unable to give any significant contributions to history due to being on BiPAP and acute respiratory failure. PMHx Chronic Respiratory Failure, COPD on home O2 1.5 L via [place],Diastolic CHF HLD, Hypothyroidism, Chronic Pain Syndrome  Patient presenting with approximately 24 hours of acute worsening shortness of breath and cough. Patient is on baseline 1.5 L nasal cannula with intermittent dry cough. Husband recently in the home with pneumonia. Patient developed a wet cough over the last 24 hours. Home O2 was increased to 2 L by nasal cannula with respiratory rate never going above 20 and O2 sats never below 94%. Denies any fevers though patient did complain of having a damp head after possible subjective fevers. Cough is now wet. Home O2 and breathing treatments with minimal benefit.  Of note patient fell on 05/20/2015 and struck her left posterior rib cage. This is caused her to breathe more shallowly since that time. Occasional Norco with some improvement in the pain.  No bowel movement in the last 4 days. This typically happens to her after starting narcotics. At baseline the patient takes for Senokot tablets daily for constipation.  Patient's daughter started her on doxycycline 100 mg twice daily last night. This was started from a previous prescription that the patient had a home.  Patient's daughter also complaining of foul-smelling urine from the patient. Denies dysuria, frequency, flank pain. Has not had a UTI in many many years.   HPI/Subjective: 1/14 patient now tired, but had been off BiPAP for 4 hours per daughter. Clearly increased WOB   Assessment/Plan: Acute on chronic respiratory  failure/COPD Exacerbation/CAP positive Metapneumovirus - Requiring BiPAP. ABG showing pH 7.3, PCO2 60.2, PO2 256, bicarbonate 29.7. Patient somewhat somnolent but arousable and oriented. Lactic acid 2.02. BNP 557. Apparent COPD exacerbation with mild pulmonary congestion suggestive of very mild CHF. Last echo showing preserved EF with severe hypokinesis of apex - DuoNeb every 4 -[place] nebulizer BID - Solu-Medrol 60 mg every 6 (Home daily prednisone 10mg ) - Mucinex DM Liquid q 4 hr -Continue Levofloxacin  -Change to D5 75 ml/hr -Monitor closely for fluid overload -Flutter valve -Physiotherapy vest BID  Lactic acidosis - Elevated on admission resolving--> 3.2-->1.3 -See acute on chronic respiratory failure  Chronic diastolic CHF -Echocardiogram; preserved EF diastolic dysfunction could not be ruled out -Strict in and out since admission + 2.1 L -Daily a.m. Weight admission weight= not obtained ;1/13 weight= 66.4 kg    1/14 weight= 66.8Kg   Elevated Troponin/Demand ischemia:  -0.21 on admission. 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 Suspect demand - cycle trop - Increase to full dose ASA - Continue plavix  Left Rib contusion:  -L rib contusion from fall on 05/20/15. No fracture on plain film. - Incentive spirometer.  - lidoderm patch - Voltaren gel  UTI? -Urine culture pending   -Most common UTIs covered by current ABx regimen  Acute on chronic renal failure -Improving with hydration, continue monitor  Constipation/Opioid-induced Constipation: -KUB' moderate stool, will hold on treating with home regimen until patient more stable - Start Senokot 8.6 mg 2 tablets  BID when not on BiPAP  - Hold Miralax -1/14 daughter very concerned that patient has not have BM in 7 days will treat with  Reglan IV 5 mg TID -If no response will consider enema or Movantik  Hypothyroid: - continue synthroid IV 50 g daily  DM Type 2 uncontrolled Hyperglycemia -1/12 hemoglobin  A1c = 9.0  -Increase to resistant SSI  HLD  -lipid panel not within ADA guidelines   HTN: -Irbesartan 75 mg when mentation improves/off BiPAP -PRN Hydralazine IV  SBP> 160 or DBP> 100  Sinus tachycardia -Most likely secondary to infection and dehydration  Altered mental status  -Appears to be multifactorial to include UTI, uremia, and COPD exacerbation  Hypernatremia -See acute on chronic respiratory failure  Muscle spasm -Robaxin IV 500 mg TID    Code Status: DO NOT RESUSCITATE Family Communication: Daughter present at time of exam Disposition Plan: SNF?    Consultants: NA  Procedure/Significant Events: 1/12 echocardiogram;- LVEF= 55%- 60%. Although no diagnostic regional wall motion abnormality was identified.   1/12 KUB;Moderate stool throughout colon. No frank obstruction or free air seen   Culture 1/12 urine negative final 1/12 influenza negative 1/12 respiratory virus panel positive Metapneumovirus 1/12 strep pneumo urine antigen/Legionella urine antigen negative    1/12 sputum pending   Antibiotics: Doxycycline 1/11>> 1/12 Levofloxacin 1/12  DVT prophylaxis: NA   Devices NA   LINES / TUBES:  NA    Continuous Infusions: . dextrose 75 mL/hr at 05/28/15 1522    Objective: VITAL SIGNS: Temp: 98.9 F (37.2 C) (01/14 1637) Temp Source: Axillary (01/14 1637) BP: 114/55 mmHg (01/14 1637) Pulse Rate: 100 (01/14 1637) SPO2; FIO2:   Intake/Output Summary (Last 24 hours) at 05/28/15 1854 Last data filed at 05/28/15 1100  Gross per 24 hour  Intake   1278 ml  Output    725 ml  Net    553 ml     Exam: General: Increased WOB back on BiPAP, positive Acute on chronic Respiratory distress Eyes:negative scleral hemorrhage ENT: Negative Runny nose, negative gingival bleeding, Neck:  Negative scars, masses, torticollis, lymphadenopathy, JVD Lungs: diffuse rhonchi Rt>>Lt, positive moderate Expiratory wheezing, negative crackles.    Cardiovascular: Tachycardic, Regular rhythm without murmur gallop or rub normal S1 and S2 Abdomen:  Pain to palpation, positive distended, positive soft, bowel sounds, positive tympany, no rebound, no ascites, no appreciable mass Extremities: No significant cyanosis, clubbing, or edema bilateral lower extremities Psychiatric:  Unable to assess secondary to altered mental status  Neurologic: Follows all commands, alert, .   Data Reviewed: Basic Metabolic Panel:  Recent Labs Lab 13-Jun-2015 1742 05/26/15 0300 05/26/15 0748 05/26/15 1130 05/27/15 0247 05/28/15 0310  NA  --  135 139 142 146* 148*  K  --  5.2* 4.6 4.1 5.0 4.5  CL  --  96* 101 101 108 112*  CO2  --  27 26 26 30 29   GLUCOSE  --  641* 424* 207* 227* 347*  BUN  --  61* 60* 62* 56* 47*  CREATININE  --  1.58* 1.45* 1.43* 1.18* 1.20*  CALCIUM  --  7.9* 8.3* 8.6* 8.1* 8.0*  MG 2.9*  --   --   --  2.9* 3.1*   Liver Function Tests:  Recent Labs Lab 2015-06-13 1313 05/26/15 0300 05/27/15 0247 05/28/15 0310  AST 24 26 36 29  ALT 17 18 19 19   ALKPHOS 50 52 44 43  BILITOT 0.6 0.5 0.5 0.3  PROT 6.0* 6.1* 5.9* 5.8*  ALBUMIN 2.9* 3.0* 2.7* 2.6*   No results for input(s): LIPASE, AMYLASE in the last 168 hours. No results for input(s): AMMONIA in the last 168  hours. CBC:  Recent Labs Lab 05-28-15 1313 05/26/15 0300 05/27/15 0247 05/28/15 0310  WBC 10.1 5.8 8.6 7.3  NEUTROABS  --   --  7.6 6.7  HGB 9.8* 9.8* 9.4* 9.2*  HCT 31.9* 31.2* 29.5* 29.4*  MCV 94.7 92.9 93.7 96.4  PLT 249 269 268 254   Cardiac Enzymes:  Recent Labs Lab 05/28/2015 1742 05-28-2015 2059 05/26/15 0300  TROPONINI 0.29* 0.34* 0.22*   BNP (last 3 results)  Recent Labs  2015/05/28 1313  BNP 557.4*    ProBNP (last 3 results) No results for input(s): PROBNP in the last 8760 hours.  CBG:  Recent Labs Lab 05/28/15 0040 05/28/15 0443 05/28/15 0821 05/28/15 1254 05/28/15 1636  GLUCAP 309* 290* 366* 243* 210*    Recent Results (from  the past 240 hour(s))  MRSA PCR Screening     Status: None   Collection Time: 05/26/15  1:26 AM  Result Value Ref Range Status   MRSA by PCR NEGATIVE NEGATIVE Final    Comment:        The GeneXpert MRSA Assay (FDA approved for NASAL specimens only), is one component of a comprehensive MRSA colonization surveillance program. It is not intended to diagnose MRSA infection nor to guide or monitor treatment for MRSA infections.   Culture, Urine     Status: None   Collection Time: 05/26/15 11:00 AM  Result Value Ref Range Status   Specimen Description URINE, RANDOM  Final   Special Requests NONE  Final   Culture 9,000 COLONIES/mL INSIGNIFICANT GROWTH  Final   Report Status 05/27/2015 FINAL  Final  Respiratory virus panel     Status: Abnormal   Collection Time: 05/26/15  3:36 PM  Result Value Ref Range Status   Source - RVPAN NASAL SWAB  Corrected   Respiratory Syncytial Virus A Negative Negative Final   Respiratory Syncytial Virus B Negative Negative Final   Influenza A Negative Negative Final   Influenza B Negative Negative Final   Parainfluenza 1 Negative Negative Final   Parainfluenza 2 Negative Negative Final   Parainfluenza 3 Negative Negative Final   Metapneumovirus Positive (A) Negative Final   Rhinovirus Negative Negative Final   Adenovirus Negative Negative Final    Comment: (NOTE) Performed At: [facility] [address] [place], [place] [id number] Mila Homer MD WG:[id number]      Studies:  Recent x-ray studies have been reviewed in detail by the Attending Physician  Scheduled Meds:  Scheduled Meds: . antiseptic oral rinse  7 mL Mouth Rinse BID  . arformoterol  15 mcg Nebulization BID  . aspirin EC  325 mg Oral Daily  . clopidogrel  75 mg Oral Daily  . diclofenac sodium  2 g Topical QID  . guaiFENesin-dextromethorphan  5 mL Oral Q4H  . heparin  5,000 Units Subcutaneous 3 times per day  . insulin aspart  0-20 Units Subcutaneous 6 times per  day  . ipratropium-albuterol  3 mL Nebulization Q4H  . irbesartan  75 mg Oral Daily  . levofloxacin  500 mg Oral Q48H  . levothyroxine  50 mcg Intravenous Q24H  . lidocaine  1 patch Transdermal Q24H  . methylPREDNISolone (SOLU-MEDROL) injection  60 mg Intravenous Q6H  . metoCLOPramide (REGLAN) injection  5 mg Intravenous 3 times per day  . senna  2 tablet Oral BID  . sodium chloride  3 mL Intravenous Q12H  . sodium chloride  3 mL Intravenous Q12H    Time spent on care of this patient:  40 mins   Kden Wagster, Roselind Messier , MD  [facility] Office  [phone] Pager [phone]  On-Call/Text Page:      [email or web address]      password [email or web address]  If 7PM-7AM, please contact night-coverage [email or web address] Password [email or web address] 05/28/2015, 6:54 PM   LOS: 3 days   Care during the described time interval was provided by me .  I have reviewed this patient's available data, including medical history, events of note, physical examination, and all test results as part of my evaluation. I have personally reviewed and interpreted all radiology studies.   Carolyne Littles, MD [phone] Pager

## 2015-05-29 ENCOUNTER — Inpatient Hospital Stay (HOSPITAL_COMMUNITY): Payer: Medicare Other

## 2015-05-29 ENCOUNTER — Encounter (HOSPITAL_COMMUNITY): Payer: Self-pay | Admitting: Radiology

## 2015-05-29 DIAGNOSIS — E785 Hyperlipidemia, unspecified: Secondary | ICD-10-CM | POA: Diagnosis present

## 2015-05-29 LAB — CBC WITH DIFFERENTIAL/PLATELET
BASOS ABS: 0 10*3/uL (ref 0.0–0.1)
BASOS PCT: 0 %
EOS PCT: 0 %
Eosinophils Absolute: 0 10*3/uL (ref 0.0–0.7)
HCT: 29.5 % — ABNORMAL LOW (ref 36.0–46.0)
Hemoglobin: 9.4 g/dL — ABNORMAL LOW (ref 12.0–15.0)
Lymphocytes Relative: 9 %
Lymphs Abs: 0.6 10*3/uL — ABNORMAL LOW (ref 0.7–4.0)
MCH: 30.4 pg (ref 26.0–34.0)
MCHC: 31.9 g/dL (ref 30.0–36.0)
MCV: 95.5 fL (ref 78.0–100.0)
MONO ABS: 0.2 10*3/uL (ref 0.1–1.0)
Monocytes Relative: 2 %
Neutro Abs: 6.5 10*3/uL (ref 1.7–7.7)
Neutrophils Relative %: 89 %
Platelets: 248 10*3/uL (ref 150–400)
RBC: 3.09 MIL/uL — ABNORMAL LOW (ref 3.87–5.11)
RDW: 14.9 % (ref 11.5–15.5)
WBC: 7.3 10*3/uL (ref 4.0–10.5)

## 2015-05-29 LAB — COMPREHENSIVE METABOLIC PANEL
ALT: 22 U/L (ref 14–54)
ANION GAP: 11 (ref 5–15)
AST: 31 U/L (ref 15–41)
Albumin: 2.8 g/dL — ABNORMAL LOW (ref 3.5–5.0)
Alkaline Phosphatase: 42 U/L (ref 38–126)
BILIRUBIN TOTAL: 0.6 mg/dL (ref 0.3–1.2)
BUN: 46 mg/dL — AB (ref 6–20)
CALCIUM: 8.2 mg/dL — AB (ref 8.9–10.3)
CO2: 27 mmol/L (ref 22–32)
Chloride: 112 mmol/L — ABNORMAL HIGH (ref 101–111)
Creatinine, Ser: 1.22 mg/dL — ABNORMAL HIGH (ref 0.44–1.00)
GFR calc Af Amer: 44 mL/min — ABNORMAL LOW (ref >60)
GFR, EST NON AFRICAN AMERICAN: 38 mL/min — AB (ref >60)
GLUCOSE: 240 mg/dL — AB (ref 65–99)
Potassium: 3.9 mmol/L (ref 3.5–5.1)
Sodium: 150 mmol/L — ABNORMAL HIGH (ref 135–145)
TOTAL PROTEIN: 5.6 g/dL — AB (ref 6.5–8.1)

## 2015-05-29 LAB — GLUCOSE, CAPILLARY
GLUCOSE-CAPILLARY: 210 mg/dL — AB (ref 65–99)
Glucose-Capillary: 205 mg/dL — ABNORMAL HIGH (ref 65–99)
Glucose-Capillary: 208 mg/dL — ABNORMAL HIGH (ref 65–99)
Glucose-Capillary: 233 mg/dL — ABNORMAL HIGH (ref 65–99)
Glucose-Capillary: 281 mg/dL — ABNORMAL HIGH (ref 65–99)
Glucose-Capillary: 312 mg/dL — ABNORMAL HIGH (ref 65–99)

## 2015-05-29 LAB — MAGNESIUM: MAGNESIUM: 2.8 mg/dL — AB (ref 1.7–2.4)

## 2015-05-29 MED ORDER — DEXTROSE 5 % IV SOLN
INTRAVENOUS | Status: DC
  Administered 2015-05-29: 21:00:00 via INTRAVENOUS

## 2015-05-29 MED ORDER — IOHEXOL 350 MG/ML SOLN
80.0000 mL | Freq: Once | INTRAVENOUS | Status: AC | PRN
  Administered 2015-05-29: 70 mL via INTRAVENOUS

## 2015-05-29 MED ORDER — INSULIN ASPART PROT & ASPART (70-30 MIX) 100 UNIT/ML ~~LOC~~ SUSP
10.0000 [IU] | Freq: Two times a day (BID) | SUBCUTANEOUS | Status: DC
  Administered 2015-05-29 (×2): 10 [IU] via SUBCUTANEOUS
  Filled 2015-05-29: qty 10

## 2015-05-29 NOTE — Progress Notes (Signed)
[place] TEAM 1 - Stepdown/ICU TEAM Progress Note  [name]:[id number] DOB: Apr 01, 1924 DOA: 05/28/2015 PCP: Ginette Otto, MD  Admit HPI / Brief Narrative: 82 y.o. female Level V caveat: History provided by EDP and patient's daughter who is a close caretaker. Patient currently unable to give any significant contributions to history due to being on BiPAP and acute respiratory failure. PMHx Chronic Respiratory Failure, COPD on home O2 1.5 L via ,Diastolic CHF HLD, Hypothyroidism, Chronic Pain Syndrome  Patient presenting with approximately 24 hours of acute worsening shortness of breath and cough. Patient is on baseline 1.5 L nasal cannula with intermittent dry cough. Husband recently in the home with pneumonia. Patient developed a wet cough over the last 24 hours. Home O2 was increased to 2 L by nasal cannula with respiratory rate never going above 20 and O2 sats never below 94%. Denies any fevers though patient did complain of having a damp head after possible subjective fevers. Cough is now wet. Home O2 and breathing treatments with minimal benefit.  Of note patient fell on 05/20/2015 and struck her left posterior rib cage. This is caused her to breathe more shallowly since that time. Occasional Norco with some improvement in the pain.  No bowel movement in the last 4 days. This typically happens to her after starting narcotics. At baseline the patient takes for Senokot tablets daily for constipation.  Patient's daughter started her on doxycycline 100 mg twice daily last night. This was started from a previous prescription that the patient had a home.  Patient's daughter also complaining of foul-smelling urine from the patient. Denies dysuria, frequency, flank pain. Has not had a UTI in many many years.   HPI/Subjective: 1/15 lethargic but arousable staff reported was unable to rest patient off BiPAP more than 30 minutes overnight. Still no BM overnight.     Assessment/Plan: Acute on chronic respiratory failure/COPD Exacerbation/CAP positive Metapneumovirus - Requiring BiPAP. ABG showing pH 7.3, PCO2 60.2, PO2 256, bicarbonate 29.7. Patient somewhat somnolent but arousable and oriented. Lactic acid 2.02. BNP 557. Apparent COPD exacerbation with mild pulmonary congestion suggestive of very mild CHF. Last echo showing preserved EF with severe hypokinesis of apex - DuoNeb every 4 -Brovana nebulizer BID - Solu-Medrol 60 mg every 6 (Home daily prednisone 10mg ) - Mucinex DM Liquid q 4 hr -Continue Levofloxacin  -Decrease D5 40 ml/hr , concerning about fluid overload -Flutter valve -Physiotherapy vest BID -Will obtain CT chest PE protocol; negative PE  -If patient does not begin to significantly improve in next 24-48 hours consider Palliative Care Consult. Have spoken to daughter on 1/14 and explained how gravely ill patient is and that she may not recover.   Lactic acidosis - Elevated on admission resolving--> 3.2-->1.3 -See acute on chronic respiratory failure  Chronic diastolic CHF -Echocardiogram; preserved EF diastolic dysfunction could not be ruled out -Strict in and out since admission + 1.9 L -Daily a.m. Weight admission weight= not obtained ;1/13 weight= 66.4 kg    1/15 weight= 62.4 Kg   Elevated Troponin/Demand ischemia:  -0.21 on admission. 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 Suspect demand - cycle trop - Increase to full dose ASA - Continue plavix  Left Rib contusion:  -L rib contusion from fall on 05/20/15. No fracture on plain film. - Incentive spirometer.  - lidoderm patch - Voltaren gel  UTI? -Urine culture pending   -Most common UTIs covered by current ABx regimen  Acute on chronic renal failure -Improving with hydration, continue  monitor  Constipation/Opioid-induced Constipation: -KUB' moderate stool, will hold on treating with home regimen until patient more stable - Continue Senokot 8.6 mg 2  tablets  BID when not on BiPAP  - Hold Miralax -1/14 daughter very concerned that patient has not have BM in 7 days will treat with Reglan IV 5 mg TID -1/15 negative BM with continued abdominal distention; start soapsuds enema -Increase Reglan IV 10 mg TID; will use Movantik last resort  Hypothyroid: - continue synthroid IV 50 g daily  DM Type 2 uncontrolled Hyperglycemia -1/12 hemoglobin A1c = 9.0  -Increase to resistant SSI -Start NovoLog 70/3010 units BID   HLD  -lipid panel not within ADA guidelines   HTN: -Irbesartan 75 mg when mentation improves/off BiPAP -PRN Hydralazine IV  SBP> 160 or DBP> 100  Sinus tachycardia -Most likely secondary to infection and dehydration  Altered mental status  -Appears to be multifactorial to include UTI, uremia, and COPD exacerbation  Hypernatremia -See acute on chronic respiratory failure -Continues to trend up  Muscle spasm -Robaxin IV 500 mg TID    Code Status: DO NOT RESUSCITATE Family Communication: None present at time of exam Disposition Plan: SNF?    Consultants: NA  Procedure/Significant Events: 1/12 echocardiogram;- LVEF= 55%- 60%. Although no diagnostic regional wall motion abnormality was identified.   1/12 KUB;Moderate stool throughout colon. No frank obstruction or free air seen 1/15 CT angiogram PE protocol; negative PE  -Mild emphysema and bilateral pleural - parenchymal scarring, Rt>>Lt.     Culture 1/12 urine negative final 1/12 influenza negative 1/12 respiratory virus panel positive Metapneumovirus 1/12 strep pneumo urine antigen/Legionella urine antigen negative    1/12 sputum pending   Antibiotics: Doxycycline 1/11>> 1/12 Levofloxacin 1/12  DVT prophylaxis: Heparin subcutaneous   Devices NA   LINES / TUBES:  NA    Continuous Infusions: . dextrose 40 mL (05/29/15 0945)    Objective: VITAL SIGNS: Temp: 98.3 F (36.8 C) (01/15 1247) Temp Source: Axillary (01/15  1247) BP: 170/73 mmHg (01/15 1247) Pulse Rate: 101 (01/15 1247) SPO2; FIO2:   Intake/Output Summary (Last 24 hours) at 05/29/15 1420 Last data filed at 05/29/15 1255  Gross per 24 hour  Intake 1252.5 ml  Output   1825 ml  Net -572.5 ml     Exam: General: Increased WOB back on BiPAP, positive Acute on chronic Respiratory distress Eyes:negative scleral hemorrhage ENT: Negative Runny nose, negative gingival bleeding, Neck:  Negative scars, masses, torticollis, lymphadenopathy, JVD Lungs: diffuse positive moderate Expiratory wheezing, negative crackles.  Cardiovascular: Tachycardic, Regular rhythm without murmur gallop or rub normal S1 and S2 Abdomen:  Pain to palpation, positive distended, positive soft, bowel sounds, positive tympany, no rebound, no ascites, no appreciable mass Extremities: No significant cyanosis, clubbing, or edema bilateral lower extremities Psychiatric:  Unable to assess secondary to altered mental status  Neurologic: Follows all commands, alert, .   Data Reviewed: Basic Metabolic Panel:  Recent Labs Lab Jun 16, 2015 1742  05/26/15 0748 05/26/15 1130 05/27/15 0247 05/28/15 0310 05/29/15 0501  NA  --   < > 139 142 146* 148* 150*  K  --   < > 4.6 4.1 5.0 4.5 3.9  CL  --   < > 101 101 108 112* 112*  CO2  --   < > 26 26 30 29 27   GLUCOSE  --   < > 424* 207* 227* 347* 240*  BUN  --   < > 60* 62* 56* 47* 46*  CREATININE  --   < >  1.45* 1.43* 1.18* 1.20* 1.22*  CALCIUM  --   < > 8.3* 8.6* 8.1* 8.0* 8.2*  MG 2.9*  --   --   --  2.9* 3.1* 2.8*  < > = values in this interval not displayed. Liver Function Tests:  Recent Labs Lab 05/23/2015 1313 05/26/15 0300 05/27/15 0247 05/28/15 0310 05/29/15 0501  AST 24 26 36 29 31  ALT 17 18 19 19 22   ALKPHOS 50 52 44 43 42  BILITOT 0.6 0.5 0.5 0.3 0.6  PROT 6.0* 6.1* 5.9* 5.8* 5.6*  ALBUMIN 2.9* 3.0* 2.7* 2.6* 2.8*   No results for input(s): LIPASE, AMYLASE in the last 168 hours. No results for input(s):  AMMONIA in the last 168 hours. CBC:  Recent Labs Lab 06/11/2015 1313 05/26/15 0300 05/27/15 0247 05/28/15 0310 05/29/15 0501  WBC 10.1 5.8 8.6 7.3 7.3  NEUTROABS  --   --  7.6 6.7 6.5  HGB 9.8* 9.8* 9.4* 9.2* 9.4*  HCT 31.9* 31.2* 29.5* 29.4* 29.5*  MCV 94.7 92.9 93.7 96.4 95.5  PLT 249 269 268 254 248   Cardiac Enzymes:  Recent Labs Lab 05/18/2015 1742 06/04/2015 2059 05/26/15 0300  TROPONINI 0.29* 0.34* 0.22*   BNP (last 3 results)  Recent Labs  05/29/2015 1313  BNP 557.4*    ProBNP (last 3 results) No results for input(s): PROBNP in the last 8760 hours.  CBG:  Recent Labs Lab 05/28/15 2022 05/28/15 2331 05/29/15 0444 05/29/15 0825 05/29/15 1247  GLUCAP 256* 312* 233* 210* 281*    Recent Results (from the past 240 hour(s))  MRSA PCR Screening     Status: None   Collection Time: 05/26/15  1:26 AM  Result Value Ref Range Status   MRSA by PCR NEGATIVE NEGATIVE Final    Comment:        The GeneXpert MRSA Assay (FDA approved for NASAL specimens only), is one component of a comprehensive MRSA colonization surveillance program. It is not intended to diagnose MRSA infection nor to guide or monitor treatment for MRSA infections.   Culture, Urine     Status: None   Collection Time: 05/26/15 11:00 AM  Result Value Ref Range Status   Specimen Description URINE, RANDOM  Final   Special Requests NONE  Final   Culture 9,000 COLONIES/mL INSIGNIFICANT GROWTH  Final   Report Status 05/27/2015 FINAL  Final  Respiratory virus panel     Status: Abnormal   Collection Time: 05/26/15  3:36 PM  Result Value Ref Range Status   Source - RVPAN NASAL SWAB  Corrected   Respiratory Syncytial Virus A Negative Negative Final   Respiratory Syncytial Virus B Negative Negative Final   Influenza A Negative Negative Final   Influenza B Negative Negative Final   Parainfluenza 1 Negative Negative Final   Parainfluenza 2 Negative Negative Final   Parainfluenza 3 Negative Negative  Final   Metapneumovirus Positive (A) Negative Final   Rhinovirus Negative Negative Final   Adenovirus Negative Negative Final    Comment: (NOTE) Performed At: [facility] [address] [place], [place] [id number] Mila Homer MD ON:[id number]      Studies:  Recent x-ray studies have been reviewed in detail by the Attending Physician  Scheduled Meds:  Scheduled Meds: . antiseptic oral rinse  7 mL Mouth Rinse BID  . arformoterol  15 mcg Nebulization BID  . aspirin EC  325 mg Oral Daily  . clopidogrel  75 mg Oral Daily  . diclofenac sodium  2 g  Topical QID  . guaiFENesin-dextromethorphan  5 mL Oral Q4H  . heparin  5,000 Units Subcutaneous 3 times per day  . insulin aspart  0-20 Units Subcutaneous 6 times per day  . insulin aspart protamine- aspart  10 Units Subcutaneous BID WC  . ipratropium-albuterol  3 mL Nebulization Q4H  . irbesartan  75 mg Oral Daily  . levofloxacin  500 mg Oral Q48H  . levothyroxine  50 mcg Intravenous Q24H  . lidocaine  1 patch Transdermal Q24H  . methylPREDNISolone (SOLU-MEDROL) injection  60 mg Intravenous Q6H  . metoCLOPramide (REGLAN) injection  5 mg Intravenous 3 times per day  . senna  2 tablet Oral BID  . sodium chloride  3 mL Intravenous Q12H  . sodium chloride  3 mL Intravenous Q12H    Time spent on care of this patient: 40 mins   WOODS, Roselind MessierURTIS J , MD  [facility] Office  [phone] Pager - [phone]  On-Call/Text Page:      [email or web address]      password [email or web address]  If 7PM-7AM, please contact night-coverage [email or web address] Password [email or web address] 05/29/2015, 2:20 PM   LOS: 4 days   Care during the described time interval was provided by me .  I have reviewed this patient's available data, including medical history, events of note, physical examination, and all test results as part of my evaluation. I have personally reviewed and interpreted all radiology studies.   Carolyne Littlesurtis Woods, MD [phone] Pager

## 2015-05-29 NOTE — Plan of Care (Signed)
Problem: Education: Goal: Knowledge of disease or condition will improve Outcome: Progressing Talked to son about plan of care for his mom tonight.  Discussed her inability to sustain her breathing off of Bipap at this time.  Talked about quality of life and comfort of the patient.

## 2015-05-29 NOTE — Progress Notes (Signed)
CPT held due to patient discomfort.  Patient is unable to perform flutter valve.  RN aware.

## 2015-05-29 NOTE — Plan of Care (Signed)
Problem: Education: Goal: Knowledge of disease or condition will improve Outcome: Progressing Discussed with son about weaning patient off the Bipap

## 2015-05-29 NOTE — Plan of Care (Signed)
Problem: Education: Goal: Knowledge of disease or condition will improve Outcome: Progressing Discussed with RT department about plan of care tonight with Bipap for the patient

## 2015-05-29 NOTE — Progress Notes (Signed)
Transported pt on vent while on BIPAP to CT and back.  Holding CPT until results of scan

## 2015-05-30 LAB — COMPREHENSIVE METABOLIC PANEL
ALK PHOS: 38 U/L (ref 38–126)
ALT: 21 U/L (ref 14–54)
ANION GAP: 7 (ref 5–15)
AST: 26 U/L (ref 15–41)
Albumin: 2.8 g/dL — ABNORMAL LOW (ref 3.5–5.0)
BUN: 43 mg/dL — ABNORMAL HIGH (ref 6–20)
CALCIUM: 8.2 mg/dL — AB (ref 8.9–10.3)
CHLORIDE: 110 mmol/L (ref 101–111)
CO2: 30 mmol/L (ref 22–32)
Creatinine, Ser: 1.14 mg/dL — ABNORMAL HIGH (ref 0.44–1.00)
GFR calc non Af Amer: 41 mL/min — ABNORMAL LOW (ref >60)
GFR, EST AFRICAN AMERICAN: 48 mL/min — AB (ref >60)
Glucose, Bld: 152 mg/dL — ABNORMAL HIGH (ref 65–99)
POTASSIUM: 3.9 mmol/L (ref 3.5–5.1)
SODIUM: 147 mmol/L — AB (ref 135–145)
Total Bilirubin: 0.4 mg/dL (ref 0.3–1.2)
Total Protein: 5.6 g/dL — ABNORMAL LOW (ref 6.5–8.1)

## 2015-05-30 LAB — CBC WITH DIFFERENTIAL/PLATELET
BASOS ABS: 0 10*3/uL (ref 0.0–0.1)
Basophils Relative: 0 %
Eosinophils Absolute: 0 10*3/uL (ref 0.0–0.7)
Eosinophils Relative: 0 %
HEMATOCRIT: 29.4 % — AB (ref 36.0–46.0)
Hemoglobin: 8.9 g/dL — ABNORMAL LOW (ref 12.0–15.0)
LYMPHS ABS: 0.4 10*3/uL — AB (ref 0.7–4.0)
LYMPHS PCT: 5 %
MCH: 28.9 pg (ref 26.0–34.0)
MCHC: 30.3 g/dL (ref 30.0–36.0)
MCV: 95.5 fL (ref 78.0–100.0)
MONO ABS: 0.4 10*3/uL (ref 0.1–1.0)
MONOS PCT: 5 %
NEUTROS ABS: 7.7 10*3/uL (ref 1.7–7.7)
Neutrophils Relative %: 90 %
Platelets: 251 10*3/uL (ref 150–400)
RBC: 3.08 MIL/uL — ABNORMAL LOW (ref 3.87–5.11)
RDW: 15 % (ref 11.5–15.5)
WBC: 8.5 10*3/uL (ref 4.0–10.5)

## 2015-05-30 LAB — GLUCOSE, CAPILLARY
GLUCOSE-CAPILLARY: 187 mg/dL — AB (ref 65–99)
Glucose-Capillary: 155 mg/dL — ABNORMAL HIGH (ref 65–99)
Glucose-Capillary: 229 mg/dL — ABNORMAL HIGH (ref 65–99)
Glucose-Capillary: 328 mg/dL — ABNORMAL HIGH (ref 65–99)
Glucose-Capillary: 208 mg/dL — ABNORMAL HIGH (ref 65–99)
Glucose-Capillary: 175 mg/dL — ABNORMAL HIGH (ref 65–99)

## 2015-05-30 LAB — MAGNESIUM: Magnesium: 3 mg/dL — ABNORMAL HIGH (ref 1.7–2.4)

## 2015-05-30 MED ORDER — WHITE PETROLATUM GEL
Status: AC
  Administered 2015-05-30: 0.2
  Filled 2015-05-30: qty 1

## 2015-05-30 MED ORDER — LORAZEPAM 2 MG/ML IJ SOLN
0.5000 mg | Freq: Four times a day (QID) | INTRAMUSCULAR | Status: DC | PRN
  Administered 2015-05-30: 0.5 mg via INTRAVENOUS
  Filled 2015-05-30: qty 1

## 2015-05-30 MED ORDER — IPRATROPIUM-ALBUTEROL 0.5-2.5 (3) MG/3ML IN SOLN
3.0000 mL | Freq: Four times a day (QID) | RESPIRATORY_TRACT | Status: DC
  Administered 2015-05-30 (×3): 3 mL via RESPIRATORY_TRACT
  Filled 2015-05-30 (×3): qty 3

## 2015-05-30 MED ORDER — LORAZEPAM 2 MG/ML IJ SOLN: 0.5000 mg | Freq: Four times a day (QID) | INTRAMUSCULAR | Status: DC | PRN

## 2015-05-30 MED ORDER — BUDESONIDE 0.25 MG/2ML IN SUSP
0.2500 mg | Freq: Two times a day (BID) | RESPIRATORY_TRACT | Status: DC
  Administered 2015-05-30 (×2): 0.25 mg via RESPIRATORY_TRACT
  Filled 2015-05-30 (×2): qty 2

## 2015-05-30 MED ORDER — POLYETHYLENE GLYCOL 3350 17 G PO PACK
17.0000 g | PACK | Freq: Every day | ORAL | Status: DC
  Administered 2015-05-30: 17 g via ORAL
  Filled 2015-05-30: qty 1

## 2015-05-30 MED ORDER — METHYLPREDNISOLONE SODIUM SUCC 125 MG IJ SOLR
60.0000 mg | Freq: Three times a day (TID) | INTRAMUSCULAR | Status: DC
  Administered 2015-05-30 (×2): 60 mg via INTRAVENOUS
  Filled 2015-05-30 (×2): qty 2

## 2015-05-30 MED ORDER — DEXTROSE-NACL 5-0.45 % IV SOLN
INTRAVENOUS | Status: DC
  Administered 2015-05-30: 12:00:00 via INTRAVENOUS

## 2015-05-30 MED ORDER — MAGNESIUM CITRATE PO SOLN
1.0000 | Freq: Once | ORAL | Status: AC | PRN
  Administered 2015-05-30: 1 via ORAL
  Filled 2015-05-30 (×2): qty 296

## 2015-05-30 MED ORDER — INSULIN ASPART 100 UNIT/ML ~~LOC~~ SOLN
0.0000 [IU] | Freq: Three times a day (TID) | SUBCUTANEOUS | Status: DC
  Administered 2015-05-30: 15 [IU] via SUBCUTANEOUS
  Administered 2015-05-30: 7 [IU] via SUBCUTANEOUS

## 2015-05-30 NOTE — Progress Notes (Addendum)
Pt noted to be in pain this morning.  Due to rib fracture chest vest is contraindicated and held due to this and pain this AM.  Pt unable to perform flutter valve at this time.

## 2015-05-30 NOTE — Progress Notes (Addendum)
TEAM 1 - Stepdown/ICU TEAM PROGRESS NOTE  [name] ZOX:[id number] DOB: 01/14/1924 DOA: 06/01/2015 PCP: Ginette Otto, MD  Admit HPI / Brief Narrative: 82 y.o. female w/ a hx of COPD on home O2 1.5 L via [place], Diastolic CHF, HLD, Hypothyroidism, and Chronic Pain Syndrome who presented with acute worsening shortness of breath and cough. Home O2 was increased to 2 L by nasal cannula with respiratory rate never going above 20 and O2 sats never below 94%. Of note patient fell on 05/20/2015 and struck her left posterior rib cage.  Patient's daughter also noted foul-smelling urine.   HPI/Subjective: Patient is alert and able to answer some questions.  She was saturating 100% on BiPAP.  Recently removed BiPAP and during my exam she maintains stable saturations on nasal cannula only.  She appears to be in mild respiratory distress with audible wheezing and tachypnea.  She asks how soon she can be discharged home.  Assessment/Plan:  Acute on chronic respiratory failure - COPD Exacerbation - Metapneumovirus URI -has required prolonged BIPAP support - giving trial off BIPAP this morning - cont maximal medical care - CT chest negative PE   Chronic diastolic CHF -TTE - preserved EF - diastolic dysfunction could not be ruled out -1/13 weight 66.4 kg> 1/15 weight 64.6 Kg - no clinical evidence of significant volume overload  Elevated Troponin / Demand ischemia -EKG unchanged from previous showing RBBB and LAFB - peaked at 0.34 - c/w demand ischemia   Left Rib contusion  -L rib contusion from fall on 05/20/15 - no fracture on plain film - complicating resp difficulty   UTI? -Urine culture w/o signif growth - no clear indication for abx   Acute on chronic renal failure -Baseline creatinine appears to be as high as 1.3 - follow trend  Constipation / Opioid-induced Constipation -thus far refractory to tx - adjust therapy and follow  Hypothyroid -continue synthroid   DM2  uncontrolled -1/12 A1c 9.0   HTN -BP currently well controlled   Altered mental status  -Appears to be improving with stabilization of respiratory status  Hypernatremia -Likely related to dehydration in the face of poor intake - encourage free water and follow  Muscle spasm -Robaxin IV 500 mg TID   Code Status: NO CODE / DNR  Family Communication: no family present at time of exam - had extended phone conversation w/ daughter  Disposition Plan: SDU  Consultants: none  Procedures: 1/12 - TTE - see above   Antibiotics: Doxycycline 1/11 Levaquin 1/12 >  DVT prophylaxis: Subcutaneous heparin  Objective: Blood pressure 132/72, pulse 99, temperature 97.6 F (36.4 C), temperature source Axillary, resp. rate 20, height 5\' 1"  (1.549 m), weight 64.6 kg (142 lb 6.7 oz), SpO2 100 %.  Intake/Output Summary (Last 24 hours) at 05/30/15 1032 Last data filed at 05/30/15 0600  Gross per 24 hour  Intake   1120 ml  Output   1050 ml  Net     70 ml   Exam: General: mild to mod resp distress w/ wheezing - more alert today Lungs: Diffuse expiratory wheezing with upper airway coarse crackling with poor air movement throughout Cardiovascular: Tachycardic but regular without appreciable murmur Abdomen: Nontender, nondistended, soft, bowel sounds positive, no rebound, no ascites, no appreciable mass Extremities: No significant cyanosis, clubbing, or edema bilateral lower extremities  Data Reviewed:  Basic Metabolic Panel:  Recent Labs Lab 05/30/2015 1742  05/26/15 1130 05/27/15 0247 05/28/15 0310 05/29/15 0501 05/30/15 0236  NA  --   < >  142 146* 148* 150* 147*  K  --   < > 4.1 5.0 4.5 3.9 3.9  CL  --   < > 101 108 112* 112* 110  CO2  --   < > 26 30 29 27 30   GLUCOSE  --   < > 207* 227* 347* 240* 152*  BUN  --   < > 62* 56* 47* 46* 43*  CREATININE  --   < > 1.43* 1.18* 1.20* 1.22* 1.14*  CALCIUM  --   < > 8.6* 8.1* 8.0* 8.2* 8.2*  MG 2.9*  --   --  2.9* 3.1* 2.8* 3.0*  < > =  values in this interval not displayed.  CBC:  Recent Labs Lab 05/26/15 0300 05/27/15 0247 05/28/15 0310 05/29/15 0501 05/30/15 0236  WBC 5.8 8.6 7.3 7.3 8.5  NEUTROABS  --  7.6 6.7 6.5 7.7  HGB 9.8* 9.4* 9.2* 9.4* 8.9*  HCT 31.2* 29.5* 29.4* 29.5* 29.4*  MCV 92.9 93.7 96.4 95.5 95.5  PLT 269 268 254 248 251    Liver Function Tests:  Recent Labs Lab 05/26/15 0300 05/27/15 0247 05/28/15 0310 05/29/15 0501 05/30/15 0236  AST 26 36 29 31 26   ALT 18 19 19 22 21   ALKPHOS 52 44 43 42 38  BILITOT 0.5 0.5 0.3 0.6 0.4  PROT 6.1* 5.9* 5.8* 5.6* 5.6*  ALBUMIN 3.0* 2.7* 2.6* 2.8* 2.8*   Coags:  Recent Labs Lab 05/24/2015 1313  INR 1.06   Cardiac Enzymes:  Recent Labs Lab 05/24/2015 1742 06/05/2015 2059 05/26/15 0300  TROPONINI 0.29* 0.34* 0.22*    CBG:  Recent Labs Lab 05/29/15 1719 05/29/15 2043 05/30/15 0122 05/30/15 0503 05/30/15 0815  GLUCAP 205* 208* 155* 187* 229*    Recent Results (from the past 240 hour(s))  MRSA PCR Screening     Status: None   Collection Time: 05/26/15  1:26 AM  Result Value Ref Range Status   MRSA by PCR NEGATIVE NEGATIVE Final    Comment:        The GeneXpert MRSA Assay (FDA approved for NASAL specimens only), is one component of a comprehensive MRSA colonization surveillance program. It is not intended to diagnose MRSA infection nor to guide or monitor treatment for MRSA infections.   Culture, Urine     Status: None   Collection Time: 05/26/15 11:00 AM  Result Value Ref Range Status   Specimen Description URINE, RANDOM  Final   Special Requests NONE  Final   Culture 9,000 COLONIES/mL INSIGNIFICANT GROWTH  Final   Report Status 05/27/2015 FINAL  Final  Respiratory virus panel     Status: Abnormal   Collection Time: 05/26/15  3:36 PM  Result Value Ref Range Status   Source - RVPAN NASAL SWAB  Corrected   Respiratory Syncytial Virus A Negative Negative Final   Respiratory Syncytial Virus B Negative Negative Final    Influenza A Negative Negative Final   Influenza B Negative Negative Final   Parainfluenza 1 Negative Negative Final   Parainfluenza 2 Negative Negative Final   Parainfluenza 3 Negative Negative Final   Metapneumovirus Positive (A) Negative Final   Rhinovirus Negative Negative Final   Adenovirus Negative Negative Final    Comment: (NOTE) Performed At: [facility] [address] [place], [place] [id number] Mila HomerHancock William F MD QM:[id number]:[phone]      Studies:   Recent x-ray studies have been reviewed in detail by the Attending Physician  Scheduled Meds:  Scheduled Meds: . antiseptic oral rinse  7 mL Mouth Rinse BID  . arformoterol  15 mcg Nebulization BID  . aspirin EC  325 mg Oral Daily  . clopidogrel  75 mg Oral Daily  . diclofenac sodium  2 g Topical QID  . guaiFENesin-dextromethorphan  5 mL Oral Q4H  . heparin  5,000 Units Subcutaneous 3 times per day  . insulin aspart  0-20 Units Subcutaneous 6 times per day  . insulin aspart protamine- aspart  10 Units Subcutaneous BID WC  . ipratropium-albuterol  3 mL Nebulization Q4H  . irbesartan  75 mg Oral Daily  . levofloxacin  500 mg Oral Q48H  . levothyroxine  50 mcg Intravenous Q24H  . lidocaine  1 patch Transdermal Q24H  . methylPREDNISolone (SOLU-MEDROL) injection  60 mg Intravenous Q6H  . metoCLOPramide (REGLAN) injection  5 mg Intravenous 3 times per day  . senna  2 tablet Oral BID  . sodium chloride  3 mL Intravenous Q12H  . sodium chloride  3 mL Intravenous Q12H    Time spent on care of this patient: 35 mins   Elide Stalzer T , MD   [facility] Office  [phone] Pager - Text Page per Loretha Stapler as per below:  On-Call/Text Page:      [email or web address]      password [email or web address]  If 7PM-7AM, please contact night-coverage [email or web address] Password [email or web address] 05/30/2015, 10:32 AM   LOS: 5 days

## 2015-05-30 NOTE — Progress Notes (Signed)
CSW spoke with family- they are agreeable to SNF placement if pt continues to require current level of assistance  CSW will hold of on initiating bed search due to continuing medical issues  CSW will continue to follow  Merlyn LotJenna Holoman, [facility] Inc[facility]WA Clinical Social Worker [phone]

## 2015-05-30 NOTE — Progress Notes (Signed)
Attempted flutter valve with pt.  Pt unable to perform.

## 2015-05-30 NOTE — Care Management Important Message (Signed)
Important Message  Patient Details  Name: [name] MRN: [id number] Date of Birth: [birth year]   Medicare Important Message Given:  Yes    Hanley HaysDowell, Jacelynn Hayton T, RN 05/30/2015, 11:37 AM

## 2015-05-30 NOTE — Clinical Social Work Note (Addendum)
Clinical Social Work Assessment  Patient Details  Name: [name] MRN: [id number] Date of Birth: 10/19/1923  Date of referral:  05/30/15               Reason for consult:  Facility Placement                Permission sought to share information with:  Family Supports Permission granted to share information::  No (pt disoriented at this time)  Name::     [name], Grainger::  [name]  Relationship::  son/daughter  Contact Information:     Housing/Transportation Living arrangements for the past 2 months:  Single Family Home Source of Information:  Adult Children Patient Interpreter Needed:  None Criminal Activity/Legal Involvement Pertinent to Current Situation/Hospitalization:  No - Comment as needed Significant Relationships:  Adult Children Lives with:  Adult Children Do you feel safe going back to the place where you live?    Need for family participation in patient care:  Yes (Comment)  Care giving concerns:  Pt lives at home with her daughter and her daughters family- pt currently requiring total assistance and unsure if pt will have sufficient assistance at home.   Social Worker assessment / plan: CSW spoke with pt son concerning pt plan for time of DC.  Pt son expressed concerns about pt health at this time and uncertainty of what would be best for patient since it seems uncertain if she will bounce back from her current state.  Son states that the pt had a negative experience with SNF in the past but that currently the pts husband is at [place] getting rehab.    Employment status:  Retired Forensic scientist:  Medicare PT Recommendations:  [place] / Referral to community resources:  [place]  Patient/Family's Response to care:  Pt son not currently sure of plan for pt and wants to await pt progression to discuss her discharge plan more  CSW met with pt daughter, [name], at bedside to  discuss- [name] states that in the patients current condition it would be impossible for her to take her home and she would have to be placed in a facility- she has a preference for [facility].  Patient/Family's Understanding of and Emotional Response to Diagnosis, Current Treatment, and Prognosis:  No questions or concerns at this time- pt son seems realistic that her prognosis could be very poor but also maintains hope for a spontaneous recovery like the pt has had previously.  [name] was very emotional and thought CSW was contacting her to tell her the patient was not going to recover.    Emotional Assessment Appearance:  Appears stated age Attitude/Demeanor/Rapport:  Unable to Assess Affect (typically observed):  Unable to Assess Orientation:  Oriented to Self Alcohol / Substance use:  Not Applicable Psych involvement (Current and /or in the community):  No (Comment)  Discharge Needs  Concerns to be addressed:  Care Coordination Readmission within the last 30 days:  No Current discharge risk:  Physical Impairment Barriers to Discharge:  Continued Medical Work up   Michelle Mon, LCSW 05/30/2015, 4:09 PM

## 2015-05-31 ENCOUNTER — Other Ambulatory Visit: Payer: Self-pay

## 2015-05-31 LAB — COMPREHENSIVE METABOLIC PANEL
ALT: 30 U/L (ref 14–54)
ANION GAP: 13 (ref 5–15)
AST: 38 U/L (ref 15–41)
Albumin: 2.4 g/dL — ABNORMAL LOW (ref 3.5–5.0)
Alkaline Phosphatase: 35 U/L — ABNORMAL LOW (ref 38–126)
BILIRUBIN TOTAL: 0.8 mg/dL (ref 0.3–1.2)
BUN: 74 mg/dL — AB (ref 6–20)
CHLORIDE: 105 mmol/L (ref 101–111)
CO2: 25 mmol/L (ref 22–32)
Calcium: 7.9 mg/dL — ABNORMAL LOW (ref 8.9–10.3)
Creatinine, Ser: 2.13 mg/dL — ABNORMAL HIGH (ref 0.44–1.00)
GFR, EST AFRICAN AMERICAN: 23 mL/min — AB (ref >60)
GFR, EST NON AFRICAN AMERICAN: 19 mL/min — AB (ref >60)
Glucose, Bld: 435 mg/dL — ABNORMAL HIGH (ref 65–99)
POTASSIUM: 6.6 mmol/L — AB (ref 3.5–5.1)
Sodium: 143 mmol/L (ref 135–145)
TOTAL PROTEIN: 4.8 g/dL — AB (ref 6.5–8.1)

## 2015-05-31 LAB — BLOOD GAS, ARTERIAL
Acid-base deficit: 8.3 mmol/L — ABNORMAL HIGH (ref 0.0–2.0)
Bicarbonate: 18.8 mEq/L — ABNORMAL LOW (ref 20.0–24.0)
DELIVERY SYSTEMS: POSITIVE
Drawn by: 418751
Expiratory PAP: 5
FIO2: 1
Inspiratory PAP: 15
O2 Saturation: 99.3 %
PH ART: 7.181 — AB (ref 7.350–7.450)
Patient temperature: 97.4
TCO2: 20.4 mmol/L (ref 0–100)
pCO2 arterial: 51.8 mmHg — ABNORMAL HIGH (ref 35.0–45.0)
pO2, Arterial: 408 mmHg — ABNORMAL HIGH (ref 80.0–100.0)

## 2015-05-31 LAB — CBC WITH DIFFERENTIAL/PLATELET
BASOS ABS: 0 10*3/uL (ref 0.0–0.1)
Basophils Relative: 0 %
EOS PCT: 0 %
Eosinophils Absolute: 0 10*3/uL (ref 0.0–0.7)
HCT: 23.9 % — ABNORMAL LOW (ref 36.0–46.0)
HEMOGLOBIN: 7.3 g/dL — AB (ref 12.0–15.0)
LYMPHS ABS: 0.7 10*3/uL (ref 0.7–4.0)
LYMPHS PCT: 6 %
MCH: 29.9 pg (ref 26.0–34.0)
MCHC: 30.5 g/dL (ref 30.0–36.0)
MCV: 98 fL (ref 78.0–100.0)
Monocytes Absolute: 0.3 10*3/uL (ref 0.1–1.0)
Monocytes Relative: 2 %
NEUTROS ABS: 11 10*3/uL — AB (ref 1.7–7.7)
NEUTROS PCT: 92 %
Platelets: 249 10*3/uL (ref 150–400)
RBC: 2.44 MIL/uL — AB (ref 3.87–5.11)
RDW: 15.5 % (ref 11.5–15.5)
WBC: 12 10*3/uL — AB (ref 4.0–10.5)

## 2015-05-31 LAB — GLUCOSE, CAPILLARY
GLUCOSE-CAPILLARY: 330 mg/dL — AB (ref 65–99)
Glucose-Capillary: 394 mg/dL — ABNORMAL HIGH (ref 65–99)

## 2015-05-31 MED ORDER — INSULIN ASPART 100 UNIT/ML ~~LOC~~ SOLN: 0.0000 [IU] | SUBCUTANEOUS | Status: DC | PRN

## 2015-05-31 MED ORDER — INSULIN ASPART 100 UNIT/ML ~~LOC~~ SOLN
8.0000 [IU] | Freq: Once | SUBCUTANEOUS | Status: AC
  Administered 2015-05-31: 8 [IU] via SUBCUTANEOUS

## 2015-06-06 LAB — BLOOD GAS, ARTERIAL
ACID-BASE EXCESS: 4.6 mmol/L — AB (ref 0.0–2.0)
Bicarbonate: 29.3 mEq/L — ABNORMAL HIGH (ref 20.0–24.0)
Delivery systems: POSITIVE
Drawn by: 213381
EXPIRATORY PAP: 5
FIO2: 0.3
INSPIRATORY PAP: 15
LHR: 10 {breaths}/min
O2 Saturation: 95.4 %
PCO2 ART: 48.9 mmHg — AB (ref 35.0–45.0)
Patient temperature: 98.6
TCO2: 30.8 mmol/L (ref 0–100)
pH, Arterial: 7.395 (ref 7.350–7.450)
pO2, Arterial: 77.4 mmHg — ABNORMAL LOW (ref 80.0–100.0)

## 2015-06-15 NOTE — Progress Notes (Signed)
Patient went asystole at 0446.  Craige Cotta, NP at bedside. Kizzie Bane, RN and Janice Norrie, RN listened for heart sounds for one minute each. None auscultated. Family notified and on route. Called [place] [facility] at [phone].

## 2015-06-15 NOTE — Progress Notes (Signed)
Shift event: This NP called by RN because pt was desatting into the 70s on Bipap, but accurate oximetry was questionable because results varied from 60% to 98%. Probes changed. Sites changed. NP to bedside.  Upon arrival, pt satting 76% on bipap. However, oximetry jumping around as low as 63% and high as 98%. ABG obtained for true reading. ABG with pH 7.1, PCO2 57 and PO2 408. Bipap settings changed. Pt began to have apneic spells and slowly deteriorated to complete apnea.  Simultaneous with above, pt was bradycardic in the 40s. EKG showed wide QRS bradycardia with RBBB (not new). HR bounced between 42 and 63. However, this slowly decreased over a period of time to asystole.   Attempted to call daughter during above events without answer. Spoke with son and informed him that his mother was presently dying. Pt expired in a matter of minutes. Called back and spoke to both daughter and son and informed of pt's passing. Family will come to the hospital. Pt was a DNR/DNI.   Death certificate completed and given to RN, Mysti.  Jimmye Norman, NP [facility]

## 2015-06-15 NOTE — Progress Notes (Signed)
Patient left unit wearing necklace and ring.

## 2015-06-15 NOTE — Discharge Summary (Signed)
Death Summary  [name] QMV:[id number] DOB: Oct 06, 1923 DOA: Jun 17, 2015  PCP: Ginette Otto, MD PCP/Office notified: No  Admit date: 2015-06-17 Date of Death: 28-Jun-2015  Final Diagnoses:  Principal Problem:   Acute respiratory failure with hypoxia and hypercarbia (HCC) Active Problems:   COPD exacerbation (HCC)   Hypertension   Elevated troponin   Rib contusion   Constipation   Hypothyroid   Acute respiratory failure with hypercapnia (HCC)   Acute on chronic renal failure (HCC)   Hyperglycemia   Sinus tachycardia (HCC)   Somnolence   CAP (community acquired pneumonia)   Lactic acidosis   Chronic diastolic CHF (congestive heart failure) (HCC)   Demand ischemia (HCC)   Uncontrolled type 2 diabetes mellitus with hyperosmolarity without coma, with long-term current use of insulin (HCC)   Therapeutic opioid induced constipation   HLD (hyperlipidemia)  Acute on chronic respiratory failure/COPD Exacerbation/CAP positive Metapneumovirus - Requiring BiPAP. ABG showing pH 7.3, PCO2 60.2, PO2 256, bicarbonate 29.7. Patient somewhat somnolent but arousable and oriented. Lactic acid 2.02. BNP 557. Apparent COPD exacerbation with mild pulmonary congestion suggestive of very mild CHF. Last echo showing preserved EF with severe hypokinesis of apex - DuoNeb every 4 -Brovana nebulizer BID - Solu-Medrol 60 mg every 6 (Home daily prednisone ) - Mucinex DM Liquid q 4 hr -Continue Levofloxacin  -Decrease D5 40 ml/hr , concerning about fluid overload -Flutter valve -Physiotherapy vest BID -Will obtain CT chest PE protocol; negative PE  -If patient does not begin to significantly improve in next 24-48 hours consider Palliative Care Consult. Have spoken to daughter on 1/14 and explained how gravely ill patient is and that she may not recover.   Lactic acidosis - Elevated on admission resolving--> 3.2-->1.3 -See acute on chronic respiratory failure  Chronic diastolic  CHF -Echocardiogram; preserved EF diastolic dysfunction could not be ruled out -Strict in and out since admission + 1.9 L -Daily a.m. Weight admission weight= not obtained ;1/13 weight= 66.4 kg 1/15 weight= 62.4 Kg   Elevated Troponin/Demand ischemia:  -0.21 on admission. 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 Suspect demand - cycle trop - Increase to full dose ASA - Continue plavix  Left Rib contusion:  -L rib contusion from fall on 05/20/15. No fracture on plain film. - Incentive spirometer.  - lidoderm patch - Voltaren gel  UTI? -Urine culture pending  -Most common UTIs covered by current ABx regimen  Acute on chronic renal failure -Improving with hydration, continue monitor  Constipation/Opioid-induced Constipation: -KUB' moderate stool, will hold on treating with home regimen until patient more stable - Continue Senokot 8.6 mg 2 tablets BID when not on BiPAP  - Hold Miralax -1/14 daughter very concerned that patient has not have BM in 7 days will treat with Reglan IV 5 mg TID -1/15 negative BM with continued abdominal distention; start soapsuds enema -Increase Reglan IV 10 mg TID; will use Movantik last resort  Hypothyroid: - continue synthroid IV 50 g daily  DM Type 2 uncontrolled Hyperglycemia -1/12 hemoglobin A1c = 9.0  -Increase to resistant SSI -Start NovoLog 70/3010 units BID  HLD  -lipid panel not within ADA guidelines   HTN: -Irbesartan 75 mg when mentation improves/off BiPAP -PRN Hydralazine IV SBP> 160 or DBP> 100  Sinus tachycardia -Most likely secondary to infection and dehydration  Altered mental status  -Appears to be multifactorial to include UTI, uremia, and COPD exacerbation  Hypernatremia -See acute on chronic respiratory failure -Continues to trend up  Muscle spasm -Robaxin IV  500 mg TID    History of present illness:  82 y.o. female Level V caveat: History provided by EDP and patient's daughter who is a  close caretaker. Patient currently unable to give any significant contributions to history due to being on BiPAP and acute respiratory failure. PMHx Chronic Respiratory Failure, COPD on home O2 1.5 L via [place],Diastolic CHF HLD, Hypothyroidism, Chronic Pain Syndrome  Patient presenting with approximately 24 hours of acute worsening shortness of breath and cough. Patient is on baseline 1.5 L nasal cannula with intermittent dry cough. Husband recently in the home with pneumonia. Patient developed a wet cough over the last 24 hours. Home O2 was increased to 2 L by nasal cannula with respiratory rate never going above 20 and O2 sats never below 94%. Denies any fevers though patient did complain of having a damp head after possible subjective fevers. Cough is now wet. Home O2 and breathing treatments with minimal benefit.  Of note patient fell on 05/20/2015 and struck her left posterior rib cage. This is caused her to breathe more shallowly since that time. Occasional Norco with some improvement in the pain.  No bowel movement in the last 4 days. This typically happens to her after starting narcotics. At baseline the patient takes for Senokot tablets daily for constipation.  Patient's daughter started her on doxycycline 100 mg twice daily last night. This was started from a previous prescription that the patient had a home.  Patient's daughter also complaining of foul-smelling urine from the patient. Denies dysuria, frequency, flank pain. Has not had a UTI in many many years. During his hospitalization patient was treated for acute on chronic respiratory failure secondary to COPD exacerbation/CAP secondary to Metapneumovirus. Chronic diastolic CHF, acute on chronic renal failure and DM Type 2 uncontrolled. Despite maximal medical treatment patient respiratory status continued to decline over time and at 0446 on June 02, 2015 patient was pronounced dead.   Time: 4098  Signed:  Carolyne Littles, MD [facility] [phone]

## 2015-06-15 DEATH — deceased

## 2017-01-24 IMAGING — CR DG CHEST 1V PORT
1 series · 1 of 1 positions shown · non-contrast
Comparison: Portable exam 1011 hours compared to 05/25/2015

CLINICAL DATA: Pneumonia, COPD, former smoker

EXAM:
PORTABLE CHEST 1 VIEW

[AP]
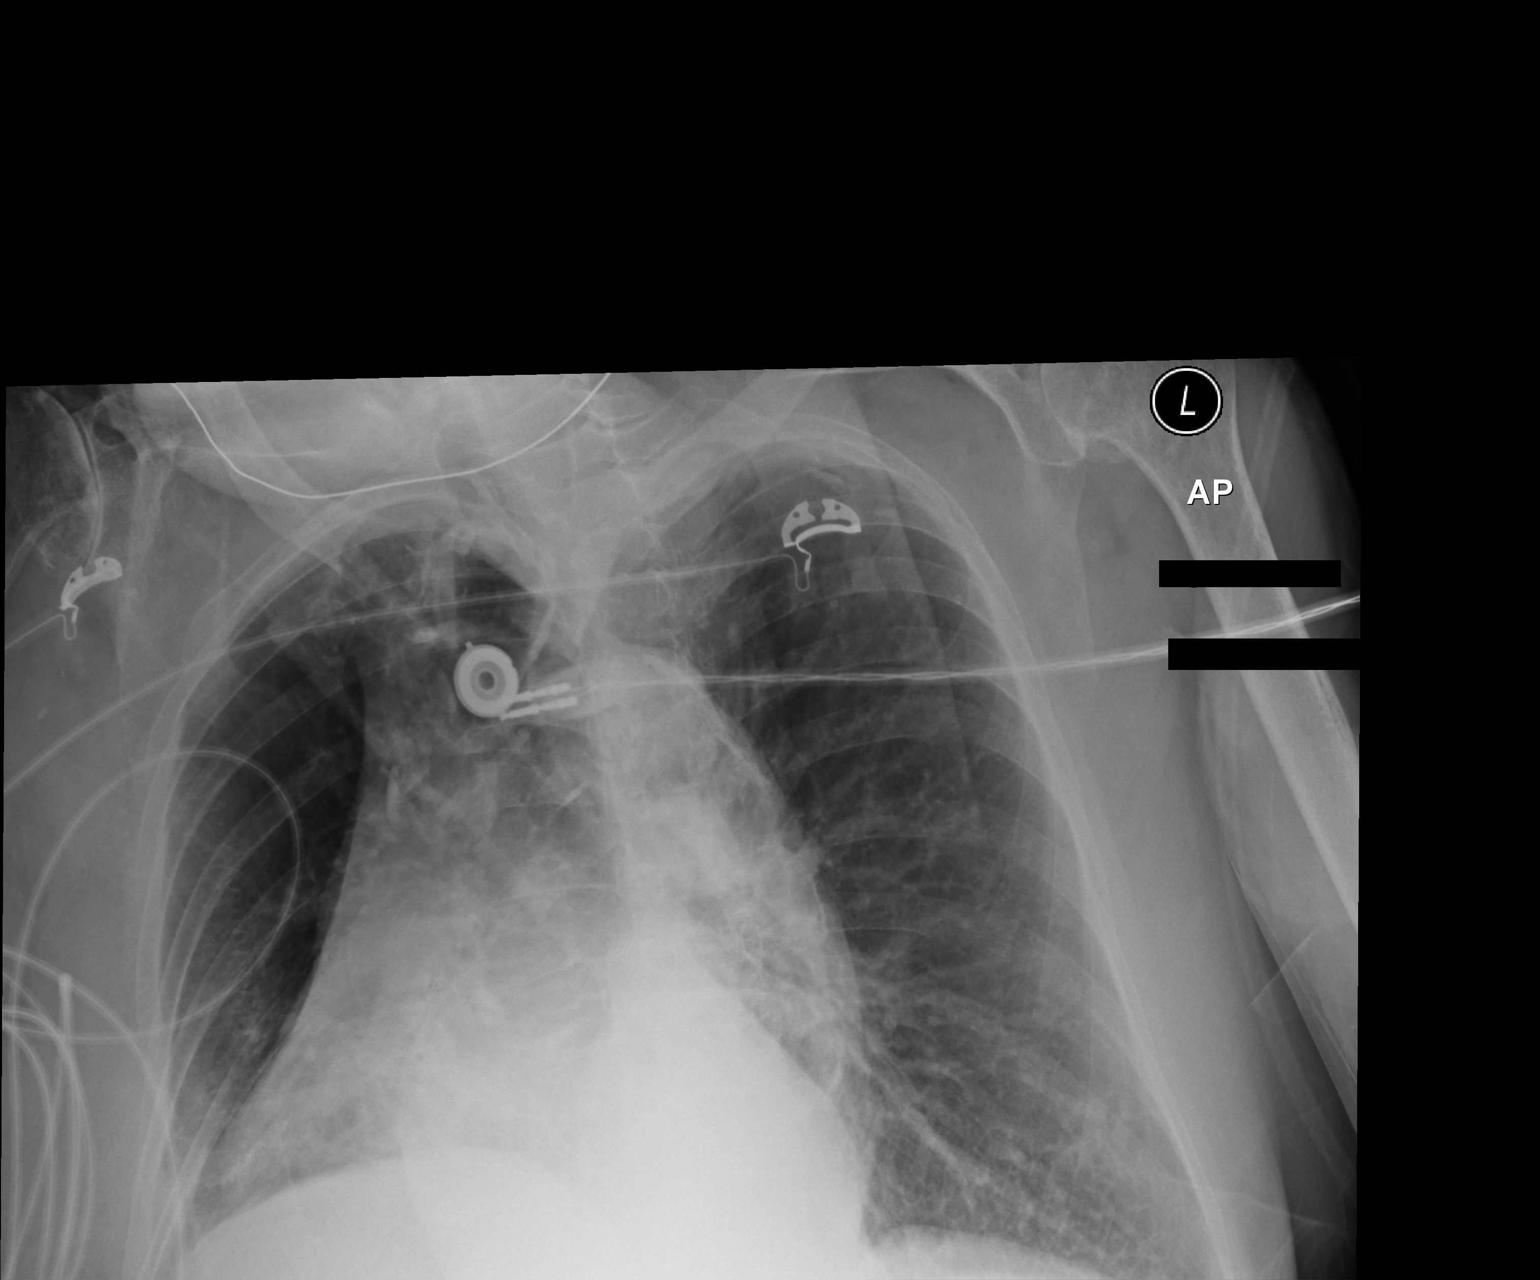

[1 of 1 positions shown; findings below may reference images not displayed]

FINDINGS: Severely rotated to the RIGHT.

Enlargement of cardiac silhouette.

Calcified tortuous thoracic aorta.

Emphysematous changes with minimal atelectasis or infiltrate at lung
bases.

Remaining lungs grossly clear.

No definite pleural effusion or pneumothorax.

Bones demineralized with advanced RIGHT glenohumeral degenerative
changes.
IMPRESSION: Enlargement of cardiac silhouette.

Bibasilar atelectasis versus infiltrate with underlying COPD
changes.

## 2017-01-24 IMAGING — CT CT ANGIO CHEST
2 of 6 series · 19 of 36 positions shown · IV contrast (Omni 300)
Comparison: None.

CLINICAL DATA: Acute respiratory failure with hypercapnia.
Worsening shortness of breath. COPD.

EXAM:
CT ANGIOGRAPHY CHEST WITH CONTRAST
TECHNIQUE: Multidetector CT imaging of the chest was performed using the
standard protocol during bolus administration of intravenous
contrast. Multiplanar CT image reconstructions and MIPs were
obtained to evaluate the vascular anatomy.
CONTRAST:  70mL OMNIPAQUE IOHEXOL 350 MG/ML SOLN

[Series 6: pe thins · axial · 0.69mm/px · z∈[+1049,+1328]mm · 18 of 618 slices shown]
[im 30/618  lung]
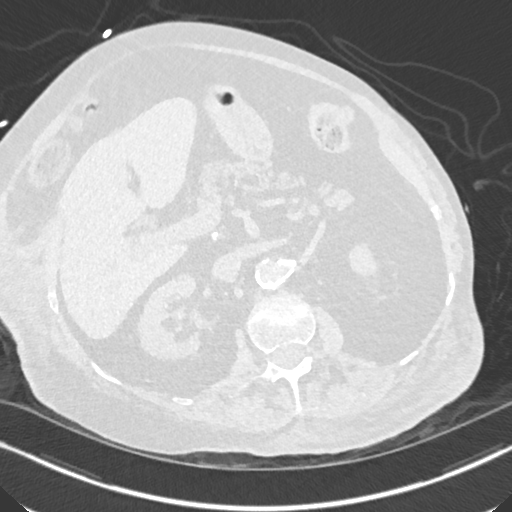
[im 59/618  mediastinal]
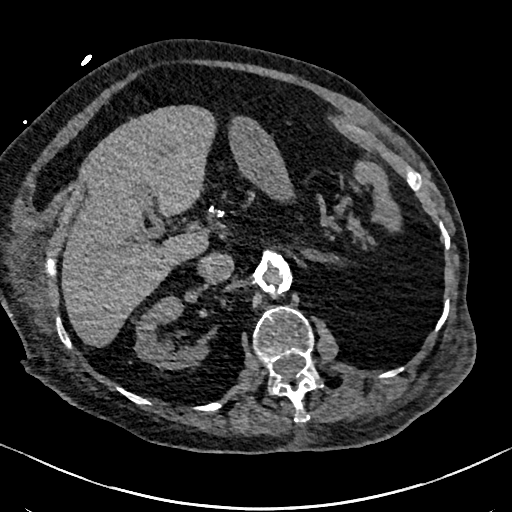
[im 89/618  lung]
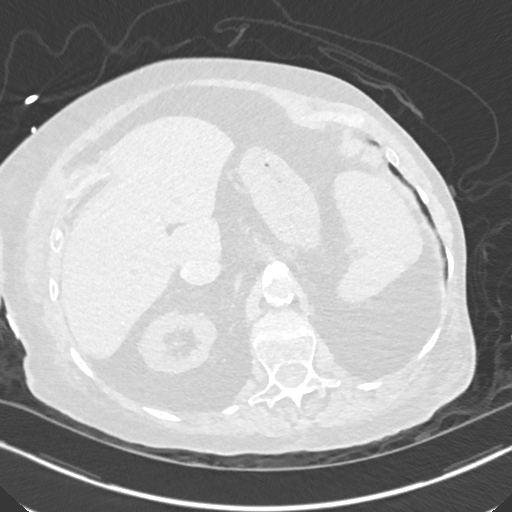
[im 118/618  mediastinal]
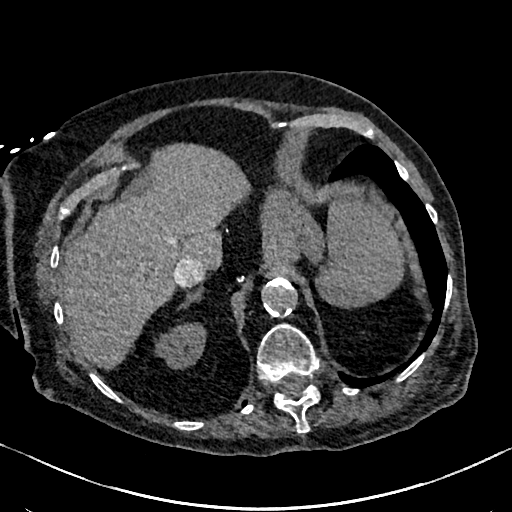
[im 177/618  lung]
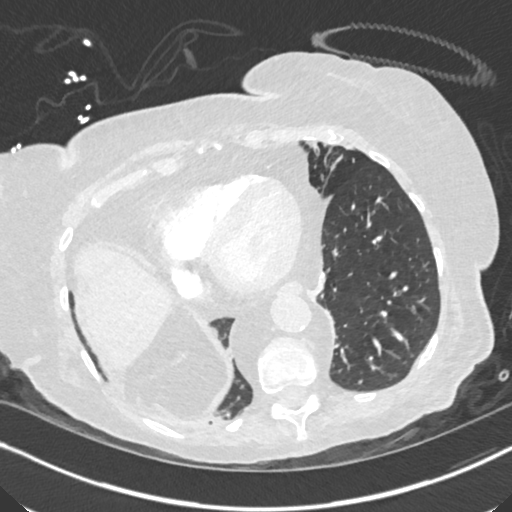
[im 206/618  mediastinal]
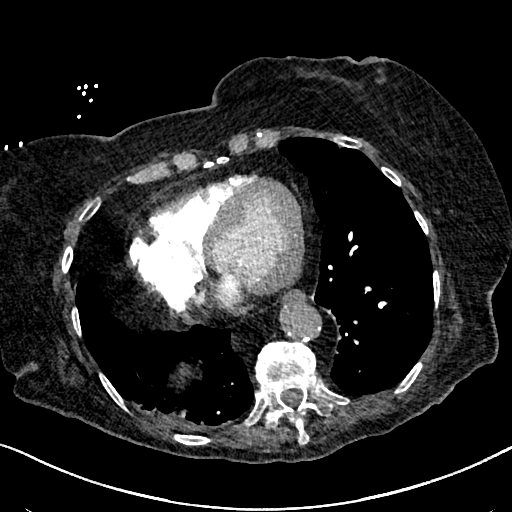
[im 236/618  lung]
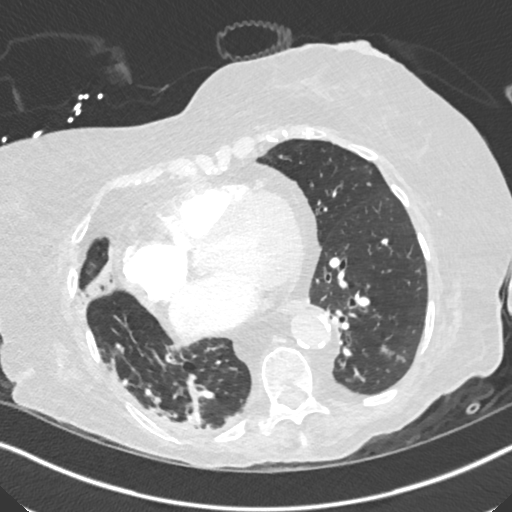
[im 265/618  mediastinal]
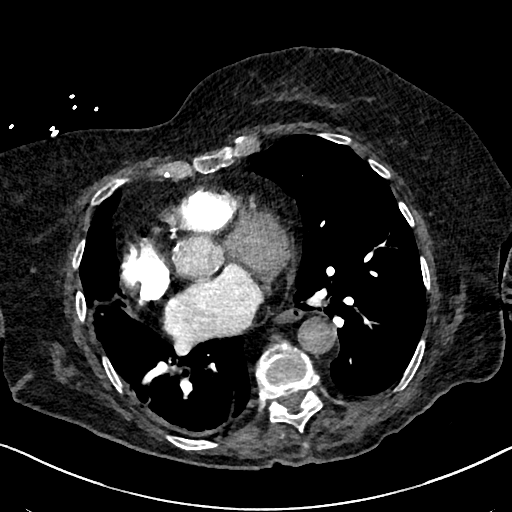
[im 294/618  lung]
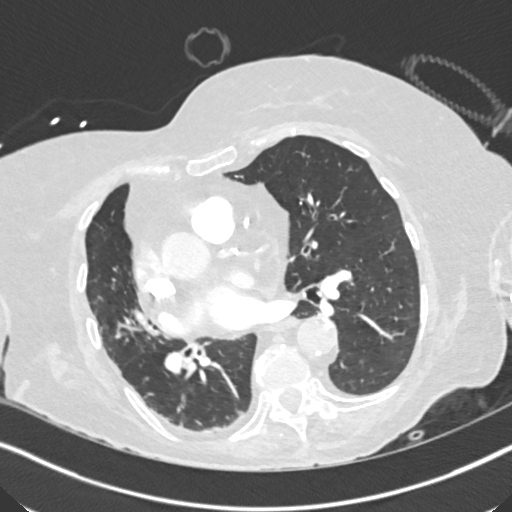
[im 324/618  mediastinal]
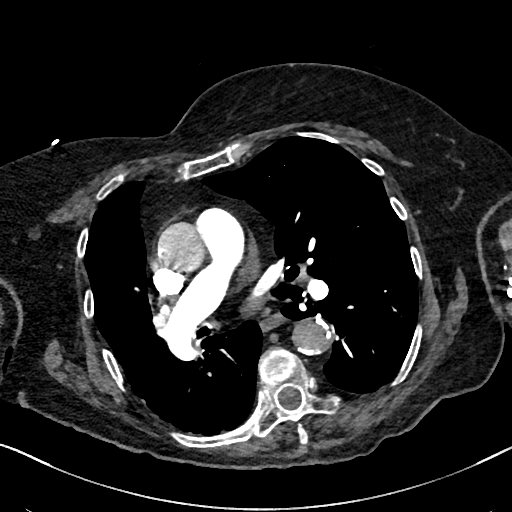
[im 353/618  lung]
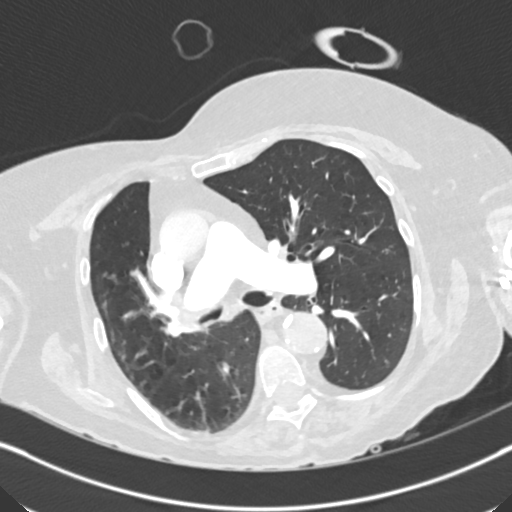
[im 382/618  mediastinal]
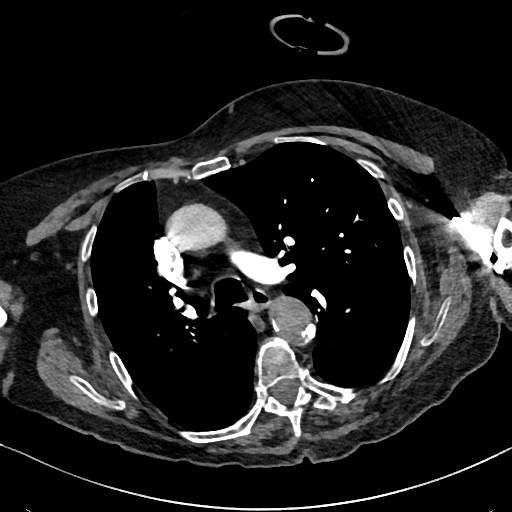
[im 412/618  lung]
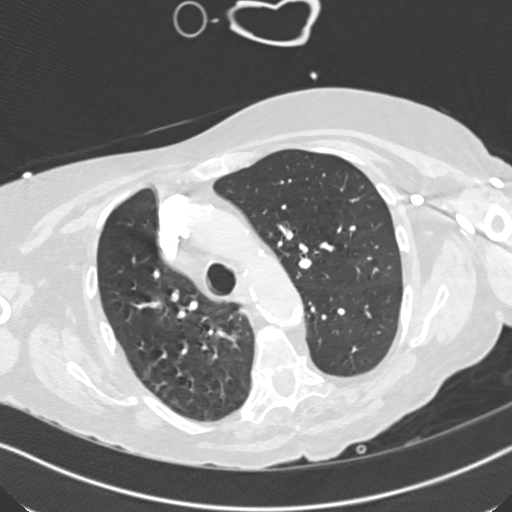
[im 471/618  mediastinal]
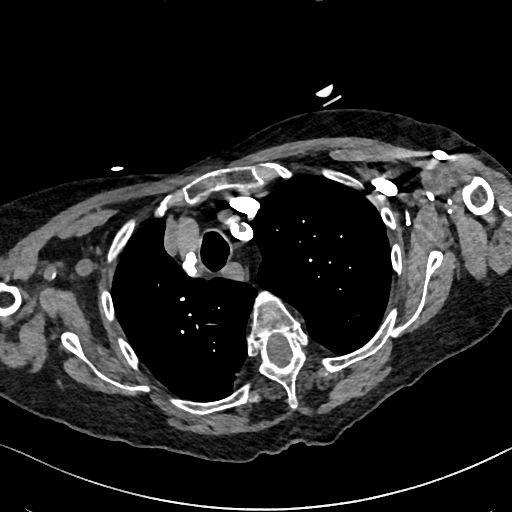
[im 500/618  lung]
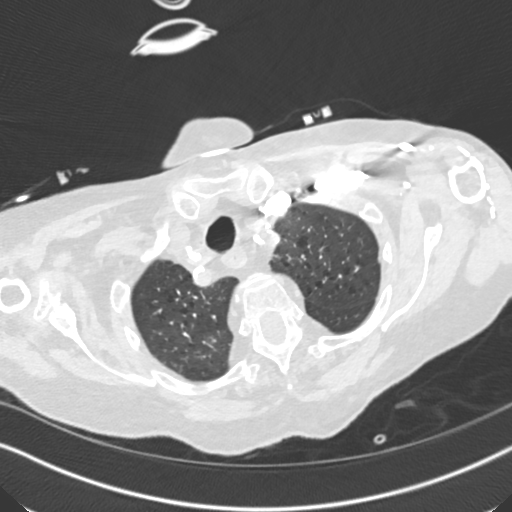
[im 529/618  mediastinal]
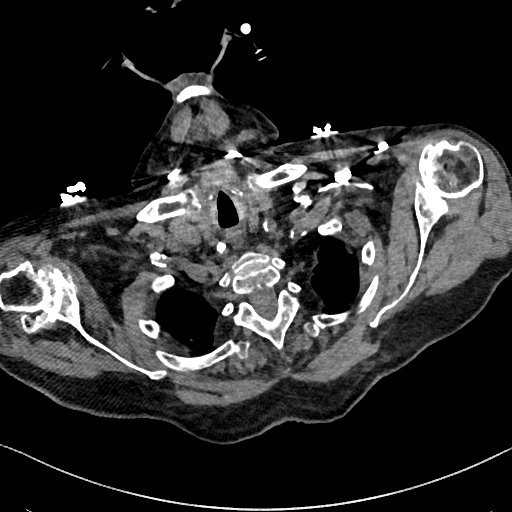
[im 559/618  lung]
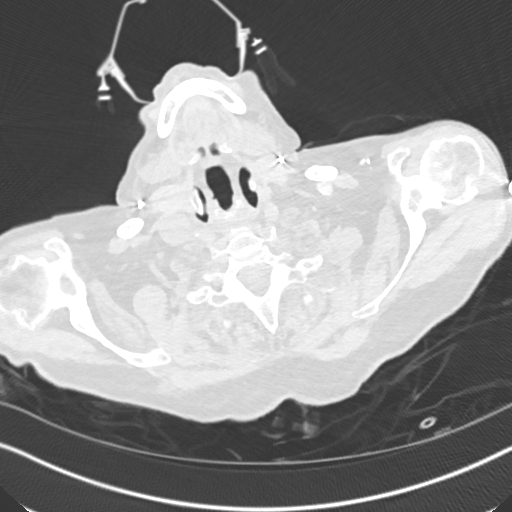
[im 588/618  mediastinal]
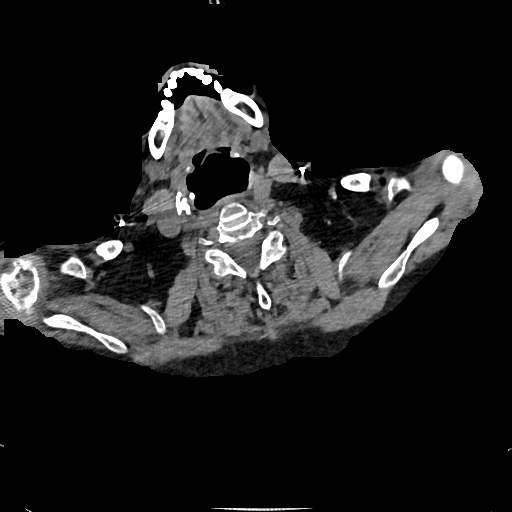

[Series 7: pe 2mm cor · coronal · 0.60mm/px · 1 of 139 slices shown]
[im 70/139  mediastinal]
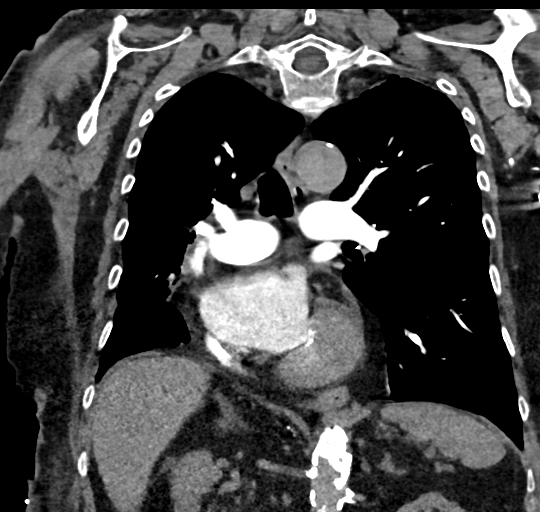

[19 of 36 positions shown; findings below may reference images not displayed]

FINDINGS: Mediastinum/Lymph Nodes: No pulmonary emboli or thoracic aortic
dissection identified. Mild cardiomegaly noted as well as coronary
artery calcification. No evidence pericardial effusion. No masses or
pathologically enlarged lymph nodes identified.

Lungs/Pleura: Mild emphysema noted. Scarring seen in the right
middle and lower lobes as well as the inferior lingula. No evidence
of pulmonary mass or consolidation. No evidence pleural effusion.

Upper abdomen: No acute findings.

Musculoskeletal: No chest wall mass or suspicious bone lesions
identified. Multiple old mid and lower thoracic vertebral body
compression deformities noted.

Review of the MIP images confirms the above findings.
IMPRESSION: No evidence of pulmonary embolism or other acute findings.

Mild emphysema and bilateral pleural - parenchymal scarring, right
side greater than left.

Mild cardiomegaly and coronary artery calcification.
